# Patient Record
Sex: Female | Born: 1960 | Race: White | Hispanic: No | Marital: Married | Smoking: Never smoker
Health system: Southern US, Community
[De-identification: ages and names within clinical notes are randomized; demographics above are authoritative.]

## PROBLEM LIST (undated history)

## (undated) DIAGNOSIS — F99 Mental disorder, not otherwise specified: Secondary | ICD-10-CM

## (undated) DIAGNOSIS — R1013 Epigastric pain: Secondary | ICD-10-CM

## (undated) DIAGNOSIS — K219 Gastro-esophageal reflux disease without esophagitis: Secondary | ICD-10-CM

## (undated) DIAGNOSIS — E7800 Pure hypercholesterolemia, unspecified: Secondary | ICD-10-CM

## (undated) DIAGNOSIS — I2510 Atherosclerotic heart disease of native coronary artery without angina pectoris: Secondary | ICD-10-CM

## (undated) DIAGNOSIS — C8190 Hodgkin lymphoma, unspecified, unspecified site: Secondary | ICD-10-CM

## (undated) DIAGNOSIS — F329 Major depressive disorder, single episode, unspecified: Secondary | ICD-10-CM

## (undated) DIAGNOSIS — Z8669 Personal history of other diseases of the nervous system and sense organs: Secondary | ICD-10-CM

## (undated) DIAGNOSIS — R002 Palpitations: Secondary | ICD-10-CM

## (undated) DIAGNOSIS — F419 Anxiety disorder, unspecified: Secondary | ICD-10-CM

## (undated) DIAGNOSIS — F32A Depression, unspecified: Secondary | ICD-10-CM

## (undated) HISTORY — DX: Gastro-esophageal reflux disease without esophagitis: K21.9

## (undated) HISTORY — DX: Mental disorder, not otherwise specified: F99

## (undated) HISTORY — DX: Hodgkin lymphoma, unspecified, unspecified site: C81.90

## (undated) HISTORY — DX: Depression, unspecified: F32.A

## (undated) HISTORY — DX: Pure hypercholesterolemia, unspecified: E78.00

## (undated) HISTORY — PX: WISDOM TOOTH EXTRACTION: SHX21

## (undated) HISTORY — PX: OTHER SURGICAL HISTORY: SHX169

## (undated) HISTORY — DX: Palpitations: R00.2

## (undated) HISTORY — PX: BREAST ENHANCEMENT SURGERY: SHX7

## (undated) HISTORY — DX: Atherosclerotic heart disease of native coronary artery without angina pectoris: I25.10

## (undated) HISTORY — DX: Anxiety disorder, unspecified: F41.9

## (undated) HISTORY — DX: Epigastric pain: R10.13

## (undated) HISTORY — DX: Personal history of other diseases of the nervous system and sense organs: Z86.69

## (undated) HISTORY — PX: TONSILLECTOMY: SUR1361

---
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
## 2000-11-07 ENCOUNTER — Ambulatory Visit (HOSPITAL_COMMUNITY): Admission: RE | Admit: 2000-11-07 | Discharge: 2000-11-07 | Payer: Self-pay | Admitting: Plastic Surgery

## 2000-11-07 ENCOUNTER — Encounter: Payer: Self-pay | Admitting: Plastic Surgery

## 2010-06-20 ENCOUNTER — Emergency Department (HOSPITAL_COMMUNITY)
Admission: EM | Admit: 2010-06-20 | Discharge: 2010-06-21 | Disposition: A | Discharge status: Other Acute Inpatient Hospital | Payer: Self-pay | Attending: Emergency Medicine | Admitting: Emergency Medicine

## 2010-06-20 DIAGNOSIS — F3289 Other specified depressive episodes: Secondary | ICD-10-CM | POA: Insufficient documentation

## 2010-06-20 DIAGNOSIS — F411 Generalized anxiety disorder: Secondary | ICD-10-CM | POA: Insufficient documentation

## 2010-06-20 DIAGNOSIS — R45851 Suicidal ideations: Secondary | ICD-10-CM | POA: Insufficient documentation

## 2010-06-20 DIAGNOSIS — M549 Dorsalgia, unspecified: Secondary | ICD-10-CM | POA: Insufficient documentation

## 2010-06-20 DIAGNOSIS — R350 Frequency of micturition: Secondary | ICD-10-CM | POA: Insufficient documentation

## 2010-06-20 DIAGNOSIS — F329 Major depressive disorder, single episode, unspecified: Secondary | ICD-10-CM | POA: Insufficient documentation

## 2010-06-20 LAB — ACETAMINOPHEN LEVEL: Acetaminophen (Tylenol), Serum: <10 ug/mL — ABNORMAL LOW (ref 10–30)

## 2010-06-20 LAB — COMPREHENSIVE METABOLIC PANEL
ALT: 13 U/L (ref 0–35)
AST: 21 U/L (ref 0–37)
Albumin: 4.3 g/dL (ref 3.5–5.2)
Alkaline Phosphatase: 79 U/L (ref 39–117)
BUN: 7 mg/dL (ref 6–23)
CO2: 24 mEq/L (ref 19–32)
Calcium: 10 mg/dL (ref 8.4–10.5)
Chloride: 103 mEq/L (ref 96–112)
Creatinine, Ser: 0.7 mg/dL (ref 0.4–1.2)
GFR calc Af Amer: >60 mL/min (ref >60)
GFR calc non Af Amer: >60 mL/min (ref >60)
Glucose, Bld: 161 mg/dL — ABNORMAL HIGH (ref 70–99)
Potassium: 3.4 mEq/L — ABNORMAL LOW (ref 3.5–5.1)
Sodium: 134 mEq/L — ABNORMAL LOW (ref 135–145)
Total Bilirubin: 0.7 mg/dL (ref 0.3–1.2)
Total Protein: 7.3 g/dL (ref 6.0–8.3)

## 2010-06-20 LAB — CBC
HCT: 42.6 % (ref 36.0–46.0)
Hemoglobin: 14.5 g/dL (ref 12.0–15.0)
MCH: 32.4 pg (ref 26.0–34.0)
MCHC: 34 g/dL (ref 30.0–36.0)
MCV: 95.1 fL (ref 78.0–100.0)
Platelets: 241 10*3/uL (ref 150–400)
RBC: 4.48 MIL/uL (ref 3.87–5.11)
RDW: 12.6 % (ref 11.5–15.5)
WBC: 7.8 10*3/uL (ref 4.0–10.5)

## 2010-06-20 LAB — DIFFERENTIAL
Basophils Absolute: 0 10*3/uL (ref 0.0–0.1)
Basophils Relative: 0 % (ref 0–1)
Eosinophils Absolute: 0.1 10*3/uL (ref 0.0–0.7)
Eosinophils Relative: 1 % (ref 0–5)
Lymphocytes Relative: 32 % (ref 12–46)
Lymphs Abs: 2.4 10*3/uL (ref 0.7–4.0)
Monocytes Absolute: 0.4 10*3/uL (ref 0.1–1.0)
Monocytes Relative: 5 % (ref 3–12)
Neutro Abs: 4.9 10*3/uL (ref 1.7–7.7)
Neutrophils Relative %: 63 % (ref 43–77)

## 2010-06-20 LAB — SALICYLATE LEVEL: Salicylate Lvl: <4 mg/dL (ref 2.8–20.0)

## 2010-06-20 LAB — RAPID URINE DRUG SCREEN, HOSP PERFORMED
Amphetamines: NOT DETECTED
Barbiturates: NOT DETECTED
Opiates: NOT DETECTED
Tetrahydrocannabinol: NOT DETECTED

## 2010-06-20 LAB — ETHANOL: Alcohol, Ethyl (B): <5 mg/dL (ref 0–10)

## 2010-06-21 ENCOUNTER — Inpatient Hospital Stay (HOSPITAL_COMMUNITY)
Admission: EM | Admit: 2010-06-21 | Discharge: 2010-06-23 | DRG: 885 | Disposition: A | Discharge status: Home / Self Care | Payer: 59 | Source: Other Acute Inpatient Hospital | Attending: Psychiatry | Admitting: Psychiatry

## 2010-06-21 DIAGNOSIS — R45851 Suicidal ideations: Secondary | ICD-10-CM

## 2010-06-21 DIAGNOSIS — Z560 Unemployment, unspecified: Secondary | ICD-10-CM

## 2010-06-21 DIAGNOSIS — F339 Major depressive disorder, recurrent, unspecified: Principal | ICD-10-CM

## 2010-06-21 DIAGNOSIS — Z6379 Other stressful life events affecting family and household: Secondary | ICD-10-CM

## 2010-06-21 DIAGNOSIS — F319 Bipolar disorder, unspecified: Secondary | ICD-10-CM

## 2010-06-21 DIAGNOSIS — E876 Hypokalemia: Secondary | ICD-10-CM

## 2010-06-21 DIAGNOSIS — Z882 Allergy status to sulfonamides status: Secondary | ICD-10-CM

## 2010-06-22 NOTE — H&P (Addendum)
NAME:  Gabrielle [name], Gabrielle [name]               ACCOUNT NO.:  [id number]  MEDICAL RECORD NO.:  [id number]           PATIENT TYPE:  I  LOCATION:  0500                          FACILITY:  [facility]  PHYSICIAN:  Marlis Edelson, DO        DATE OF BIRTH:  November 17, 1960  DATE OF ADMISSION:  06/21/2010 DATE OF DISCHARGE:                      PSYCHIATRIC ADMISSION ASSESSMENT   IDENTIFYING INFORMATION:  The patient is a 50 year old divorced white female who presented to the [facility] emergency room reporting increased thoughts of depression with passive thoughts of suicide due to recent increase in family stressors including loss of her job loss, loss of her home, no income and living with her elderly 47 year old mother. She stopped her Lamictal approximately 3 weeks ago.  PAST PSYCHIATRIC HISTORY:  The patient states this is her first admission.  SOCIAL HISTORY:  She is divorced with no children.  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  She lives again [place] elderly mother [place]  FAMILY HISTORY:  Significant for alcoholism in her father.  ALCOHOL/DRUG HISTORY:  None.  MEDICAL HISTORY:  Primary care.  She sees Dr. Carney Corners as her primary care and Dr. Betti Cruz for psychiatry.  Medical problems.  She denies any current ongoing problems.  MEDICATIONS: 1. Xanax. 2. Paxil. 3. Benztropine. 4. She thinks Lamictal which she stopped about 3 weeks ago.  Gets     medications at the drug store in [place], area code [phone]-     2500.  POSITIVE FINDINGS EXAMINATION:  The patient was seen in the emergency room at [facility] where the exam was unremarkable.  LABORATORY RESULTS:  Included a normal CBC with differential.  Negative salicylate level.  Negative acetaminophen level.  Negative alcohol level.  Urine pregnancy was negative and the patient was referred to behavioral health.  Comprehensive metabolic profile slightly  hypokalemic at 134, potassium 3.4, glucose is high at 161 and drug screen was positive for benzodiazepines.  MENTAL STATUS EXAM:  Today the patient is a well-developed, well- nourished young-appearing 50 year old white female, cooperative and pleasant.  Thoughts and speech are normally organized and no evidence of delusions, paranoia.  She was alert and oriented x4, pleasant, informative, makes good eye contact and speech is clear and goal- directed, normal rate and rhythm and volume.  There is no evidence of psychosis.  Mood is depressed.  Affect is sad.  Thought process again disorganized and content is of at least above average intelligence.  ASSESSMENT:  Major depressive disorder recurrent.  PLAN:  Meds will be reconciled.  AXIS I:  Major depressive disorder. AXIS II:  Negative. AXIS III:  No acute illness. AXIS IV:  Problems with primary support, problems with occupation, problems with economic situation, problems with housing.  AXIS V:  GAF current 45.  PLAN:  The patient will be admitted for estimated length of stay 2-3 days.  Meds will be restarted as evaluated and we will follow up with those results.    ______________________________ Verne Spurr, PA   ______________________________ Marlis Edelson, DO    NM/MEDQ  D:  06/21/2010  T:  06/21/2010  Job:  [id number]  Electronically Signed by Marlis Edelson MD on 06/22/2010 08:11:36 PM Electronically Signed by Verne Spurr  on 08/04/2010 09:59:31 AM

## 2010-07-21 NOTE — Discharge Summary (Signed)
  NAME:  Gabrielle [name], Gabrielle [name]               ACCOUNT NO.:  [id number]  MEDICAL RECORD NO.:  [id number]           PATIENT TYPE:  I  LOCATION:  0500                          FACILITY:  [facility]  PHYSICIAN:  Marlis Edelson, DO        DATE OF BIRTH:  04-Aug-1960  DATE OF ADMISSION:  06/21/2010 DATE OF DISCHARGE:                              DISCHARGE SUMMARY   REASON FOR HOSPITALIZATION:  This is a 50 year old divorced female who presented with increasing thoughts of depression, passive thoughts of suicide due to recent increase in family stressors.  The patient lost her job, her home, no income and living with her elderly 50 year old mother.  She also had stopped her Lamictal approximately 3 weeks ago.  FINAL DIAGNOSES:  AXIS I:  Major depressive disorder. AXIS II:  Deferred. AXIS III:  No acute or chronic health issues. AXIS IV:  Problems with primary support group, occupation, economic situation, housing. AXIS V:  Current is 50-55.  SIGNIFICANT LABS:  The patient had a normal CBC.  She had a normal salicylate level.  Normal acetaminophen level.  Alcohol level less than 5.  Urine pregnancy test was negative.  Glucose was elevated at 161. Urine drug screen is positive for benzodiazepines.  Our plan was to admit the patient to the adult milieu in the mood disorder group.  We will resume her medications, continue to assess her comorbidities.  We contacted the patient's boyfriend to address any safety concerns, provide information and education.  She was denying any suicidal thoughts.  The patient was endorsing her multiple stressors. The patient was reporting panic attacks that were treated with Paxil. The patient was acting appropriate on the unit.  She was having coherent thought content, increasing insight.  On day of discharge the patient denied any suicidal or homicidal thoughts.  She was to be followed up at [facility].  DISCHARGE MEDICATIONS: 1. Included Vistaril 2 capsules q.h.s. p.r.n. 2.  Multivitamin one daily. 3. Paxil 40 mg taking 1-1/2 tablets daily. 4. Xanax 0.5 mg 1 tablet q.i.d. 5. The patient was to stop her Ambien.  FOLLOWUP:  Her followup was at [facility] on Thursday, March 1.  The patient is to call to get an appointment with Dr. Betti Cruz.     Landry Corporal, N.P.   ______________________________ Marlis Edelson, DO    JO/MEDQ  D:  07/21/2010  T:  07/21/2010  Job:  [id number]  Electronically Signed by Limmie PatriciaP. on 07/21/2010 03:54:25 PM Electronically Signed by Marlis Edelson MD on 07/21/2010 08:59:44 PM

## 2011-12-06 ENCOUNTER — Encounter: Payer: Self-pay | Admitting: Obstetrics and Gynecology

## 2012-01-18 ENCOUNTER — Encounter: Payer: Self-pay | Admitting: Obstetrics and Gynecology

## 2012-01-18 ENCOUNTER — Ambulatory Visit (INDEPENDENT_AMBULATORY_CARE_PROVIDER_SITE_OTHER): Payer: Medicare Other | Admitting: Obstetrics and Gynecology

## 2012-01-18 VITALS — BP 114/72 | HR 76 | Ht 62.5 in | Wt 134.0 lb

## 2012-01-18 DIAGNOSIS — F329 Major depressive disorder, single episode, unspecified: Secondary | ICD-10-CM

## 2012-01-18 DIAGNOSIS — N951 Menopausal and female climacteric states: Secondary | ICD-10-CM

## 2012-01-18 DIAGNOSIS — N912 Amenorrhea, unspecified: Secondary | ICD-10-CM

## 2012-01-18 DIAGNOSIS — L659 Nonscarring hair loss, unspecified: Secondary | ICD-10-CM

## 2012-01-18 DIAGNOSIS — F32A Depression, unspecified: Secondary | ICD-10-CM

## 2012-01-18 LAB — TSH: TSH: 1.583 u[IU]/mL (ref 0.350–4.500)

## 2012-01-18 NOTE — Progress Notes (Addendum)
Menopausal symptoms:anxiety, decreased libido, depression, dry skin, hot flashes, insomnia, moodiness, no energy, vaginal dryness  The patient is not taking hormone replacement therapy The patient  is taking a Calcium supplement. The patient participates in regular exercise: no. Post-menopausal bleeding:no  The patient is sexually active.  Last Pap: was normal December  2012 Last mammogram: was normal 2 years ago Last DEXA scan : never had one and pt states she do not want one  History of DVT/PE: No pt has fhx of dvt: maternal aunt Family history of breast cancer: No Family history of endometrial cancer:No  PT IS DIVORSED

## 2012-01-18 NOTE — Patient Instructions (Signed)
To develop good sleep habits:    Go to bed and get up  at the same time each day (even on your days off)  Avoid caffeine, alcohol or nicotine at least 4-6 hours before bedtime  If you haven't fallen asleep within 15 minutes of getting in the bed, get up and do something non-stimulating  until you feel sleepy again,  then return to bed.  Only try to sleep when you are actually sleepy.  Do not watch TV, read, write, play games or talk on the phone in bed.  Only use the bed for sleep and sex  Do not nap or remain  in the bed if you are awake  Do not go to bed too  hungry or  too full   Develop a routine prior to bedtime so that your body will get a signal that bedtime is near  Do not do anything stimulating before bedtime  Make sure that your bedroom is comfortable for sleeping   
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
## 2012-01-21 ENCOUNTER — Encounter: Payer: Self-pay | Admitting: Obstetrics and Gynecology

## 2012-01-27 ENCOUNTER — Telehealth: Payer: Self-pay | Admitting: Obstetrics and Gynecology

## 2012-01-27 NOTE — Telephone Encounter (Signed)
Tc from pt. Told pt TSH-wnl. Pt agrees.

## 2012-01-27 NOTE — Telephone Encounter (Signed)
Tc to pt per telephone call. Lm on vm to cb. 

## 2012-02-10 ENCOUNTER — Telehealth: Payer: Self-pay | Admitting: Obstetrics and Gynecology

## 2012-02-10 NOTE — Telephone Encounter (Signed)
Spoke with pt rgd msg pt calling for saliva test results informed pt will consult with provider rgd recommendations and call her back pt voice understanding

## 2012-02-16 ENCOUNTER — Telehealth: Payer: Self-pay | Admitting: Obstetrics and Gynecology

## 2012-02-16 MED ORDER — PROGESTERONE MICRONIZED 200 MG PO CAPS: ORAL_CAPSULE | ORAL | Status: DC

## 2012-02-16 MED ORDER — ESTRADIOL 0.5 MG PO TABS: 0.5000 mg | ORAL_TABLET | Freq: Every day | ORAL | Status: DC

## 2012-02-16 NOTE — Telephone Encounter (Signed)
Call to patient to review recent hormone results: low estradiol, progesterone, and low normal DHEA.  Patient's morning cortisol and testosterone were both high. Patient to begin oral Estradiol 0.5 mg qd, Micronized Progesterone 200 mg qhs day 1-26 each month and DHEA 5-10 mg qd.   Patient urged to pursue a "talk" therapist to assist with coping and lifestyle skills.  Patient to follow up in 4 weeks (verbally) or sooner if she has any concerns or questions.  Will have a visit in 8 weeks.  Jaydan Chretien, PA-C

## 2012-02-17 ENCOUNTER — Telehealth: Payer: Self-pay | Admitting: Obstetrics and Gynecology

## 2012-02-23 ENCOUNTER — Telehealth: Payer: Self-pay | Admitting: Obstetrics and Gynecology

## 2012-02-23 ENCOUNTER — Telehealth: Payer: Self-pay

## 2012-02-23 ENCOUNTER — Other Ambulatory Visit: Payer: Self-pay | Admitting: Obstetrics and Gynecology

## 2012-02-23 NOTE — Telephone Encounter (Signed)
TC to pt.  Per EP notified RX has been called.

## 2012-02-23 NOTE — Telephone Encounter (Signed)
VM from pt states rx was called to incorrect pharmacy.

## 2012-02-23 NOTE — Telephone Encounter (Signed)
Call to [facility], in response to patient stating that her medications had not been called in, Progesterone Capsules 200 mg  #30 1 po qhs days 1-26 of each month with 5 refills.  Yeiren Whitecotton, PA-C

## 2012-02-23 NOTE — Telephone Encounter (Signed)
VM from pt. States awaiting RX from EP.  Not at compounding pharmacy.  Pt [phone]

## 2012-02-24 NOTE — Telephone Encounter (Signed)
VM from pt.  Pharmacy issue is resolved.

## 2012-03-29 ENCOUNTER — Telehealth: Payer: Self-pay | Admitting: Obstetrics and Gynecology

## 2012-03-30 NOTE — Telephone Encounter (Signed)
TC TO PT REGARDING MESSAGE. PT IS TAKING HORMONE REPLACEMENT THERAPY AND IS HAVING SOME SPOTTING. PT STATES WHEN SHE HAD THE SPOTTING SHE DID CUT BACK ON MED (ESTRADIOL AND PROGESTERONE). PT WANT TO KNOW WHAT TO DO AND I TOLD PT THAT I WILL SEND EP A MESSAGE AND SEE WHAT HER REPLY IS AND GIVE PT A CALL BACK. INFORMED PT THAT IF SHE STARTS SOAKING A PAD AN HOUR  TO GIVE [place] A CALL BACK. PT VOICED UNDERSTANDING.

## 2012-03-30 NOTE — Telephone Encounter (Signed)
EP, THIS PT IS CALLING REGARDING HER HORMONE THERAPY. SHE TAKES ESTRADIOL 0.5 MG AND PROGESTERONE 200 MG AND SHE STATES THAT SHE IS HAVING SOME SPOTTING. PT DID SAY THAT WHEN SHE HAD THE SPOTTING SHE DID CUT BACK ON MEDS.  PT WANT TO KNOW WHAT DO SHE NEED TO DO. PT STATES AT FIRST, SHE SOAK A PAD A DAY, THEN SHE JUST HAD PERIODIC SPOTTING. WHAT IS YOUR RECOMMENDATION?

## 2012-04-06 ENCOUNTER — Telehealth: Payer: Self-pay | Admitting: Obstetrics and Gynecology

## 2012-04-06 NOTE — Telephone Encounter (Signed)
Tc to pt per telephone call. Pt wtold to take progesterone day 1-26 of each month. Pt voices understanding.

## 2013-04-26 DIAGNOSIS — R002 Palpitations: Secondary | ICD-10-CM

## 2013-04-26 HISTORY — DX: Palpitations: R00.2

## 2013-09-24 ENCOUNTER — Encounter: Payer: Self-pay | Admitting: Cardiovascular Disease

## 2013-09-24 ENCOUNTER — Encounter (INDEPENDENT_AMBULATORY_CARE_PROVIDER_SITE_OTHER): Payer: Self-pay

## 2013-09-24 ENCOUNTER — Ambulatory Visit (INDEPENDENT_AMBULATORY_CARE_PROVIDER_SITE_OTHER): Payer: Medicare Other | Admitting: Cardiovascular Disease

## 2013-09-24 VITALS — BP 110/80 | HR 81 | Ht 64.0 in | Wt 131.0 lb

## 2013-09-24 DIAGNOSIS — R0609 Other forms of dyspnea: Secondary | ICD-10-CM

## 2013-09-24 DIAGNOSIS — R002 Palpitations: Secondary | ICD-10-CM | POA: Insufficient documentation

## 2013-09-24 DIAGNOSIS — R0989 Other specified symptoms and signs involving the circulatory and respiratory systems: Secondary | ICD-10-CM

## 2013-09-24 DIAGNOSIS — R0600 Dyspnea, unspecified: Secondary | ICD-10-CM

## 2013-09-24 HISTORY — PX: TRANSTHORACIC ECHOCARDIOGRAM: SHX275

## 2013-09-24 NOTE — Progress Notes (Signed)
     History of Present Illness: 53 yo female with history of migraine headaches, IBS, depression who is here today as a new patient for evaluation of palpitations. She has noticed skipped heart beats for the last 20 years. Within the last year she has had almost daily episodes of skipped beats. This is associated with dyspnea. No chest pain. She is very active.   Primary Care Physician: Monico Blitz  Past Medical History  Diagnosis Date  . Hx of migraines   . Depression   . Anxiety     Past Surgical History  Procedure Laterality Date  . Wisdom tooth extraction    . Tonsillectomy    . Breast enhancement surgery      Current Outpatient Prescriptions  Medication Sig Dispense Refill  . ALPRAZolam (XANAX) 0.5 MG tablet Take 0.5 mg by mouth 3 (three) times daily as needed.      Marland Kitchen estradiol (ESTRACE) 0.5 MG tablet Take 1 tablet (0.5 mg total) by mouth daily.  30 tablet  5  . lamoTRIgine (LAMICTAL) 100 MG tablet Take 100 mg by mouth daily.      Marland Kitchen PARoxetine (PAXIL) 40 MG tablet Take 40 mg by mouth every morning.      . progesterone (PROMETRIUM) 200 MG capsule 1 po qhs day 1- 26 of each month  30 capsule  5   No current facility-administered medications for this visit.    Allergies  Allergen Reactions  . Sulfa Antibiotics     History   Social History  . Marital Status: Divorced    Spouse Name: N/A    Number of Children: 0  . Years of Education: N/A   Occupational History  . Disability    Social History Main Topics  . Smoking status: Never Smoker   . Smokeless tobacco: Never Used  . Alcohol Use: No  . Drug Use: No  . Sexual Activity: Yes    Birth Control/ Protection: Condom   Other Topics Concern  . Not on file   Social History Narrative  . No narrative on file    Family History  Problem Relation Age of Onset  . Heart disease Mother   . Emphysema Mother   . Hypertension Mother   . Stroke Mother   . Depression Mother   . Hypertension Brother   . Depression  Brother   . Stroke Maternal Aunt   . Depression Maternal Aunt   . Heart failure Mother   . CAD Maternal Aunt     Review of Systems:  As stated in the HPI and otherwise negative.   BP 110/80  Pulse 81  Ht 5\' 4"  (1.626 m)  Wt 131 lb (59.421 kg)  BMI 22.47 kg/m2  Physical Examination: General: Well developed, well nourished, NAD HEENT: OP clear, mucus membranes moist SKIN: warm, dry. No rashes. Neuro: No focal deficits Musculoskeletal: Muscle strength 5/5 all ext Psychiatric: Mood and affect normal Neck: No JVD, no carotid bruits, no thyromegaly, no lymphadenopathy. Lungs:Clear bilaterally, no wheezes, rhonci, crackles Cardiovascular: Regular rate and rhythm. No murmurs, gallops or rubs. Abdomen:Soft. Bowel sounds present. Non-tender.  Extremities: No lower extremity edema. Pulses are 2 + in the bilateral DP/PT.  EKG: NSR, rate 81 bpm.   Assessment and Plan:   1. Palpitations: She likely has premature beats. Will arrange 48 hour monitor to assess, exclude atrial fib/flutter. Avoid stimulants. Reduce caffeine intake.   2. Dyspnea: Related to palpitations. Will arrange echo to assess LV function.

## 2013-09-24 NOTE — Patient Instructions (Signed)
Your physician recommends that you schedule a follow-up appointment in:  4-6 weeks.   Your physician has requested that you have an echocardiogram. Echocardiography is a painless test that uses sound waves to create images of your heart. It provides your doctor with information about the size and shape of your heart and how well your heart's chambers and valves are working. This procedure takes approximately one hour. There are no restrictions for this procedure.   Your physician has recommended that you wear a holter monitor. Holter monitors are medical devices that record the heart's electrical activity. Doctors most often use these monitors to diagnose arrhythmias. Arrhythmias are problems with the speed or rhythm of the heartbeat. The monitor is a small, portable device. You can wear one while you do your normal daily activities. This is usually used to diagnose what is causing palpitations/syncope (passing out).

## 2013-09-26 ENCOUNTER — Encounter: Payer: Self-pay | Admitting: Radiology

## 2013-09-26 ENCOUNTER — Ambulatory Visit (HOSPITAL_COMMUNITY)
Admission: RE | Admit: 2013-09-26 | Discharge: 2013-09-26 | Disposition: A | Discharge status: Home / Self Care | Payer: Medicare Other | Source: Ambulatory Visit | Attending: Cardiovascular Disease | Admitting: Cardiovascular Disease

## 2013-09-26 ENCOUNTER — Other Ambulatory Visit: Payer: Self-pay | Admitting: *Deleted

## 2013-09-26 DIAGNOSIS — R0600 Dyspnea, unspecified: Secondary | ICD-10-CM

## 2013-09-26 DIAGNOSIS — R0609 Other forms of dyspnea: Secondary | ICD-10-CM | POA: Insufficient documentation

## 2013-09-26 DIAGNOSIS — R002 Palpitations: Secondary | ICD-10-CM

## 2013-09-26 DIAGNOSIS — R0602 Shortness of breath: Secondary | ICD-10-CM

## 2013-09-26 DIAGNOSIS — R0989 Other specified symptoms and signs involving the circulatory and respiratory systems: Principal | ICD-10-CM | POA: Insufficient documentation

## 2013-09-26 NOTE — Progress Notes (Signed)
2D Echo Performed 09/26/2013    Ellyssa Zagal, RCS  

## 2013-09-26 NOTE — Progress Notes (Signed)
Patient ID: Gabrielle [name], female   DOB: Nov 30, 1960, 53 y.o.   MRN: [id number] Evo 48hr holter applied

## 2013-10-18 ENCOUNTER — Telehealth: Payer: Self-pay | Admitting: *Deleted

## 2013-10-18 NOTE — Telephone Encounter (Signed)
Spoke with pt and reviewed monitor results with her.  

## 2013-10-18 NOTE — Telephone Encounter (Signed)
I placed call to pt to review monitor results. Left message to call back 

## 2013-11-15 ENCOUNTER — Ambulatory Visit: Payer: Medicare Other | Admitting: Cardiovascular Disease

## 2014-03-04 DIAGNOSIS — C8580 Other specified types of non-Hodgkin lymphoma, unspecified site: Secondary | ICD-10-CM | POA: Insufficient documentation

## 2014-04-26 DIAGNOSIS — C8590 Non-Hodgkin lymphoma, unspecified, unspecified site: Secondary | ICD-10-CM

## 2014-04-26 HISTORY — PX: OTHER SURGICAL HISTORY: SHX169

## 2014-04-26 HISTORY — DX: Non-Hodgkin lymphoma, unspecified, unspecified site: C85.90

## 2014-05-28 ENCOUNTER — Ambulatory Visit (INDEPENDENT_AMBULATORY_CARE_PROVIDER_SITE_OTHER): Payer: Medicare Other | Admitting: Physician Assistant

## 2014-05-28 ENCOUNTER — Encounter: Payer: Self-pay | Admitting: Physician Assistant

## 2014-05-28 ENCOUNTER — Other Ambulatory Visit (INDEPENDENT_AMBULATORY_CARE_PROVIDER_SITE_OTHER): Payer: Medicare Other

## 2014-05-28 VITALS — BP 116/70 | HR 66 | Ht 64.0 in | Wt 133.0 lb

## 2014-05-28 DIAGNOSIS — R140 Abdominal distension (gaseous): Secondary | ICD-10-CM

## 2014-05-28 DIAGNOSIS — Z1211 Encounter for screening for malignant neoplasm of colon: Secondary | ICD-10-CM

## 2014-05-28 DIAGNOSIS — R1013 Epigastric pain: Secondary | ICD-10-CM

## 2014-05-28 LAB — COMPREHENSIVE METABOLIC PANEL
ALT: 17 U/L (ref 0–35)
AST: 21 U/L (ref 0–37)
Albumin: 4.9 g/dL (ref 3.5–5.2)
Alkaline Phosphatase: 101 U/L (ref 39–117)
BILIRUBIN TOTAL: 0.4 mg/dL (ref 0.2–1.2)
BUN: 17 mg/dL (ref 6–23)
CALCIUM: 10.2 mg/dL (ref 8.4–10.5)
CHLORIDE: 103 meq/L (ref 96–112)
CO2: 32 mEq/L (ref 19–32)
Creatinine, Ser: 0.76 mg/dL (ref 0.40–1.20)
GFR: 84.34 mL/min (ref >60.00)
Glucose, Bld: 93 mg/dL (ref 70–99)
Potassium: 4.7 mEq/L (ref 3.5–5.1)
Sodium: 140 mEq/L (ref 135–145)
Total Protein: 7.2 g/dL (ref 6.0–8.3)

## 2014-05-28 LAB — LIPID PANEL
CHOL/HDL RATIO: 3
CHOLESTEROL: 175 mg/dL (ref 0–200)
HDL: 56.4 mg/dL (ref >39.00)
LDL CALC: 89 mg/dL (ref 0–99)
NONHDL: 118.6
Triglycerides: 146 mg/dL (ref 0.0–149.0)
VLDL: 29.2 mg/dL (ref 0.0–40.0)

## 2014-05-28 LAB — CBC WITH DIFFERENTIAL/PLATELET
BASOS ABS: 0 10*3/uL (ref 0.0–0.1)
Basophils Relative: 0.5 % (ref 0.0–3.0)
EOS ABS: 0.1 10*3/uL (ref 0.0–0.7)
Eosinophils Relative: 1.3 % (ref 0.0–5.0)
HCT: 41.2 % (ref 36.0–46.0)
HEMOGLOBIN: 14.2 g/dL (ref 12.0–15.0)
LYMPHS ABS: 2.4 10*3/uL (ref 0.7–4.0)
Lymphocytes Relative: 27.9 % (ref 12.0–46.0)
MCHC: 34.5 g/dL (ref 30.0–36.0)
MCV: 94.1 fl (ref 78.0–100.0)
MONO ABS: 0.7 10*3/uL (ref 0.1–1.0)
Monocytes Relative: 7.7 % (ref 3.0–12.0)
NEUTROS ABS: 5.3 10*3/uL (ref 1.4–7.7)
NEUTROS PCT: 62.6 % (ref 43.0–77.0)
Platelets: 254 10*3/uL (ref 150.0–400.0)
RBC: 4.37 Mil/uL (ref 3.87–5.11)
RDW: 12.6 % (ref 11.5–15.5)
WBC: 8.5 10*3/uL (ref 4.0–10.5)

## 2014-05-28 LAB — IGA: IgA: 222 mg/dL (ref 68–378)

## 2014-05-28 LAB — TSH: TSH: 1.6 u[IU]/mL (ref 0.35–4.50)

## 2014-05-28 MED ORDER — MOVIPREP 100 G PO SOLR: 1.0000 | Freq: Once | ORAL | Status: DC

## 2014-05-28 NOTE — Patient Instructions (Addendum)
Your physician has requested that you go to the basement for the following lab work before leaving today: CBC, CMET, TSH, IgA, tissue transglutamase, lipid panel  You have been scheduled for a CT scan of the abdomen and pelvis at [facility] ([address]---this is in the same building as Press photographer).   You are scheduled on 05/29/14 at 1:45 pm. You should arrive 15 minutes prior to your appointment time for registration. Please follow the written instructions below on the day of your exam:  WARNING: IF YOU ARE ALLERGIC TO IODINE/X-RAY DYE, PLEASE NOTIFY RADIOLOGY IMMEDIATELY AT [phone]! YOU WILL BE GIVEN A 13 HOUR PREMEDICATION PREP.  1) Do not eat or drink anything after 9:45 am (4 hours prior to your test) 2) You have been given 2 bottles of oral contrast to drink. The solution may taste better if refrigerated, but do NOT add ice or any other liquid to this solution. Shake well before drinking.    Drink 1 bottle of contrast @ 11:45 am (2 hours prior to your exam)  Drink 1 bottle of contrast @ 12:45 am (1 hour prior to your exam)  You may take any medications as prescribed with a small amount of water except for the following: Metformin, Glucophage, Glucovance, Avandamet, Riomet, Fortamet, Actoplus Met, Janumet, Glumetza or Metaglip. The above medications must be held the day of the exam AND 48 hours after the exam.  The purpose of you drinking the oral contrast is to aid in the visualization of your intestinal tract. The contrast solution may cause some diarrhea. Before your exam is started, you will be given a small amount of fluid to drink. Depending on your individual set of symptoms, you may also receive an intravenous injection of x-ray contrast/dye. Plan on being at [facility] for 30 minutes or long, depending on the type of exam you are having performed.  This test typically takes 30-45 minutes to complete.  If you have any questions regarding your  exam or if you need to reschedule, you may call the CT department at [phone] between the hours of 8:00 am and 5:00 pm, Monday-Friday.  ________________________________________________________________________ Dennis Bast should hear from [employer] regarding Cologuard testing.  CC:Dr Manuella Ghazi

## 2014-05-28 NOTE — Progress Notes (Signed)
Patient ID: Gabrielle [name], female   DOB: [birth year], 54 y.o.   MRN: [id number]    HPI:    Gabrielle [name] is a 54 year old female referred for evaluation by Dr. Brigitte Pulse due to abdominal bloating, dyspepsia, and need for colorectal cancer screening.  [name] reports that for the past 6 months her total abdomen feels excessively distended, bloated, and gassy. She has not changed her diet at all but states some days she is so bloated and she has to loosen her clothing. She started using a probiotic but it has not provided much relief. She has been nauseous, and saw her PCP last week and was told her nausea was due to a vertical from a sinus infection. She was started on meclizine and amoxicillin and her nausea has improved but her bloating persist. She has no postprandial cramping or defecation. Her appetite has been good and she has no weight loss. She has no dysphasia or heartburn. She has not been burping or belching. She has not found anything that makes her bloating better but finds it is worse when she eats. She has her uterus and ovaries and is menopausal. She was previously on hormone replacement but discontinued it a year and a half ago because she didn't think she needed it anymore she has a gynecologist but has not seen him in over 2 years. She has never had a colonoscopy. She does report that she has been under an inordinate amount of stress and she wonders if this contributes to her bloating.   Past Medical History  Diagnosis Date  . Hx of migraines   . Depression   . Anxiety     Past Surgical History  Procedure Laterality Date  . Wisdom tooth extraction    . Tonsillectomy    . Breast enhancement surgery     Family History  Problem Relation Age of Onset  . Heart disease Mother   . Emphysema Mother   . Hypertension Mother   . Stroke Mother   . Depression Mother   . Hypertension Brother   . Depression Brother   . Stroke Maternal Aunt   . Depression Maternal Aunt   . Heart failure Mother   .  CAD Maternal Aunt   . Colon cancer     History  Substance Use Topics  . Smoking status: Never Smoker   . Smokeless tobacco: Never Used  . Alcohol Use: No   Current Outpatient Prescriptions  Medication Sig Dispense Refill  . ALPRAZolam (XANAX) 0.5 MG tablet Take 0.5 mg by mouth 3 (three) times daily as needed.    . lamoTRIgine (LAMICTAL) 100 MG tablet Take 200 mg by mouth daily.     Marland Kitchen PARoxetine (PAXIL) 40 MG tablet Take 40 mg by mouth every morning.     No current facility-administered medications for this visit.   Allergies  Allergen Reactions  . Sulfa Antibiotics      Review of Systems: Gen: Denies any fever, chills, sweats, anorexia, fatigue, weakness, malaise, weight loss, and sleep disorder CV: Denies chest pain, angina, palpitations, syncope, orthopnea, PND, peripheral edema, and claudication. Resp: Denies dyspnea at rest, dyspnea with exercise, cough, sputum, wheezing, coughing up blood, and pleurisy. GI: Denies vomiting blood, jaundice, and fecal incontinence.   Denies dysphagia or odynophagia. GU : Denies urinary burning, blood in urine, urinary frequency, urinary hesitancy, nocturnal urination, and urinary incontinence. MS: Denies joint pain, limitation of movement, and swelling, stiffness, low back pain, extremity pain. Denies muscle weakness, cramps, atrophy.  Derm: Denies  rash, itching, dry skin, hives, moles, warts, or unhealing ulcers.  Psych: Denies depression, anxiety, memory loss, suicidal ideation, hallucinations, paranoia, and confusion. Heme: Denies bruising, bleeding, and enlarged lymph nodes. Neuro:  Denies any headaches, dizziness, paresthesias. Endo:  Denies any problems with DM, thyroid, adrenal function    Physical Exam: BP 116/70 mmHg  Pulse 66  Ht 5\' 4"  (1.626 m)  Wt 133 lb (60.328 kg)  BMI 22.82 kg/m2 Constitutional: Pleasant,well-developed,anxious female in no acute distress. HEENT: Normocephalic and atraumatic. Conjunctivae are normal. No  scleral icterus. Neck supple. No thyromegaly Cardiovascular: Normal rate, regular rhythm.  Pulmonary/chest: Effort normal and breath sounds normal. No wheezing, rales or rhonchi. Abdominal: Soft,  mildlydistended, nontender. Bowel sounds active throughout. There are no masses palpable. No hepatomegaly. Extremities: no edema Lymphadenopathy: No cervical adenopathy noted. Neurological: Alert and oriented to person place and time. Skin: Skin is warm and dry. No rashes noted. Psychiatric: Normal mood and affect. Behavior is normal.  ASSESSMENT AND PLAN: #1 abdominal bloating and dyspepsia. This may be due to a component of irritable bowel based on the amount of stress the patient has been under. However, a CT of the abdomen and pelvis will be obtained to evaluate for any ovarian or other pathology. A CBC, comprehensive metabolic panel, TSH, IgA, and TTG will be obtained as well. The patient would like a lipid panel added if she needs to see her primary care next week as well.  #2. Need for colorectal cancer screening. A considerable amount of time has been spent with the patient reviewing her options of colonoscopy versus cold guard testing. The patient does not feel she would be able to tolerate a bowel prep and so the decision was made to proceed with cologuard testing with the understanding that if the test is positive she would need a conventional colonoscopy. However, the patient changed her mind before she left the waiting room in her colo guard testing was canceled and she opted to schedule for a colonoscopy.The risks, benefits, and alternatives to colonoscopy with possible biopsy and possible polypectomy were discussed with the patient and they consent to proceed. The procedure will be scheduled with Dr. Ardis Hughs.  Further recommendations will be made pending the findings of her CT scan and colonoscopy.    Kyrel Leighton, Vita Barley PA-C 05/28/2014, 4:00 PM

## 2014-05-29 ENCOUNTER — Ambulatory Visit (INDEPENDENT_AMBULATORY_CARE_PROVIDER_SITE_OTHER)
Admission: RE | Admit: 2014-05-29 | Discharge: 2014-05-29 | Disposition: A | Discharge status: Home / Self Care | Payer: Medicare Other | Source: Ambulatory Visit | Attending: Physician Assistant | Admitting: Physician Assistant

## 2014-05-29 DIAGNOSIS — R140 Abdominal distension (gaseous): Secondary | ICD-10-CM

## 2014-05-29 DIAGNOSIS — R1013 Epigastric pain: Secondary | ICD-10-CM

## 2014-05-29 LAB — TISSUE TRANSGLUTAMINASE, IGA: Tissue Transglutaminase Ab, IgA: 1 U/mL

## 2014-05-29 MED ORDER — IOHEXOL 300 MG/ML  SOLN
100.0000 mL | Freq: Once | INTRAMUSCULAR | Status: AC | PRN
  Administered 2014-05-29: 100 mL via INTRAVENOUS

## 2014-05-29 NOTE — Progress Notes (Signed)
I agree with the above note, plan 

## 2014-05-30 ENCOUNTER — Telehealth: Payer: Self-pay

## 2014-05-30 DIAGNOSIS — R1900 Intra-abdominal and pelvic swelling, mass and lump, unspecified site: Secondary | ICD-10-CM

## 2014-05-30 NOTE — Telephone Encounter (Addendum)
   Gabrielle [name], see below. I spoke with her this am   [name],  She needs referral to interventional radiology for image guided biopsy of the abdominal mass, they should note that lymphoma is in the differential. She also needs referral to medical oncology.   [name],  She needs to be put on the next gi cancer conference as well.  '  Holding on surgical referral until after pathology back, review at tumor board.          ----- Message from Milus Banister, MD sent at 05/30/2014  9:45 AM EST ----- Change of plans.    She really is based out of [place]. Will almost certainly want her care there.  She will go ahead with the biopsy with IR but then lets cancel the med onc referral.  Thanks  [name], Still put her on for next tumor board.  Thanks

## 2014-05-30 NOTE — Telephone Encounter (Signed)
Referral to IR for liver biopsy also email to [facility] for GI conference

## 2014-05-30 NOTE — Telephone Encounter (Signed)
Referral to IR and GI conference

## 2014-05-31 ENCOUNTER — Other Ambulatory Visit: Payer: Self-pay | Admitting: Radiology

## 2014-06-04 ENCOUNTER — Ambulatory Visit (HOSPITAL_COMMUNITY)
Admission: RE | Admit: 2014-06-04 | Discharge: 2014-06-04 | Disposition: A | Discharge status: Home / Self Care | Payer: Medicare Other | Source: Ambulatory Visit | Attending: Gastroenterology | Admitting: Gastroenterology

## 2014-06-04 DIAGNOSIS — R1900 Intra-abdominal and pelvic swelling, mass and lump, unspecified site: Secondary | ICD-10-CM | POA: Insufficient documentation

## 2014-06-04 DIAGNOSIS — R002 Palpitations: Secondary | ICD-10-CM | POA: Insufficient documentation

## 2014-06-04 LAB — CBC
HEMATOCRIT: 44.7 % (ref 36.0–46.0)
HEMOGLOBIN: 15 g/dL (ref 12.0–15.0)
MCH: 32.5 pg (ref 26.0–34.0)
MCHC: 33.6 g/dL (ref 30.0–36.0)
MCV: 97 fL (ref 78.0–100.0)
PLATELETS: 227 10*3/uL (ref 150–400)
RBC: 4.61 MIL/uL (ref 3.87–5.11)
RDW: 12.2 % (ref 11.5–15.5)
WBC: 6 10*3/uL (ref 4.0–10.5)

## 2014-06-04 LAB — PROTIME-INR
INR: 0.98 (ref 0.00–1.49)
PROTHROMBIN TIME: 13.1 s (ref 11.6–15.2)

## 2014-06-04 LAB — APTT: APTT: 29 s (ref 24–37)

## 2014-06-04 MED ORDER — MIDAZOLAM HCL 2 MG/2ML IJ SOLN
INTRAMUSCULAR | Status: AC | PRN
  Administered 2014-06-04 (×2): 1 mg via INTRAVENOUS

## 2014-06-04 MED ORDER — GELATIN ABSORBABLE 12-7 MM EX MISC
CUTANEOUS | Status: AC
  Filled 2014-06-04: qty 1

## 2014-06-04 MED ORDER — LIDOCAINE HCL (PF) 1 % IJ SOLN
INTRAMUSCULAR | Status: AC
  Filled 2014-06-04: qty 30

## 2014-06-04 MED ORDER — MIDAZOLAM HCL 2 MG/2ML IJ SOLN
INTRAMUSCULAR | Status: AC
  Filled 2014-06-04: qty 4

## 2014-06-04 MED ORDER — SODIUM CHLORIDE 0.9 % IV SOLN
Freq: Once | INTRAVENOUS | Status: AC
  Administered 2014-06-04: 08:00:00 via INTRAVENOUS

## 2014-06-04 MED ORDER — FENTANYL CITRATE 0.05 MG/ML IJ SOLN
INTRAMUSCULAR | Status: AC | PRN
  Administered 2014-06-04: 50 ug via INTRAVENOUS
  Administered 2014-06-04: 25 ug via INTRAVENOUS

## 2014-06-04 MED ORDER — FENTANYL CITRATE 0.05 MG/ML IJ SOLN
INTRAMUSCULAR | Status: AC
  Filled 2014-06-04: qty 4

## 2014-06-04 MED ORDER — HYDROCODONE-ACETAMINOPHEN 5-325 MG PO TABS
1.0000 | ORAL_TABLET | ORAL | Status: DC | PRN
  Filled 2014-06-04: qty 2

## 2014-06-04 NOTE — Procedures (Signed)
Procedure:  CT guided core biopsy of left abdominal peritoneal mass Findings:  8 cm left mid abdominal mass localized.  CT guided core biopsy performed with 18 G core samples x 3 via 17 G needle.   Tract embolized with Gelfoam pledgets.  No immediate complications.

## 2014-06-04 NOTE — H&P (Signed)
Chief Complaint: Abdominal bloating  Referring Physician(s): Jacobs,Daniel P  History of Present Illness: Gabrielle [name] is a 54 y.o. female   Pt has noticed for several weeks abd bloating and gassiness No pain really Unable to eat without nausea 05/29/14 Abd CT reveals abdominal mass Now scheduled for bx of same  Past Medical History  Diagnosis Date  . Hx of migraines   . Depression   . Anxiety   . Palpitations     Past Surgical History  Procedure Laterality Date  . Wisdom tooth extraction    . Tonsillectomy    . Breast enhancement surgery      Allergies: Sulfa antibiotics  Medications: Prior to Admission medications   Medication Sig Start Date End Date Taking? Authorizing Provider  acetaminophen (TYLENOL) 500 MG tablet Take 500 mg by mouth every 6 (six) hours as needed for headache (pain).   Yes Historical Provider, MD  ALPRAZolam Duanne Moron) 0.5 MG tablet Take 0.5 mg by mouth 3 (three) times daily as needed for anxiety.    Yes Historical Provider, MD  amoxicillin (AMOXIL) 500 MG capsule Take 500 mg by mouth 3 (three) times daily.   Yes Historical Provider, MD  Aspirin-Acetaminophen-Caffeine (GOODY HEADACHE PO) Take 1 packet by mouth daily as needed (headache).   Yes Historical Provider, MD  lamoTRIgine (LAMICTAL) 100 MG tablet Take 200 mg by mouth daily.    Yes Historical Provider, MD  PARoxetine (PAXIL) 40 MG tablet Take 40 mg by mouth every morning.   Yes Historical Provider, MD    Family History  Problem Relation Age of Onset  . Heart disease Mother   . Emphysema Mother   . Hypertension Mother   . Stroke Mother   . Depression Mother   . Hypertension Brother   . Depression Brother   . Stroke Maternal Aunt   . Depression Maternal Aunt   . Heart failure Mother   . CAD Maternal Aunt   . Colon cancer      History   Social History  . Marital Status: Married    Spouse Name: N/A    Number of Children: 0  . Years of Education: N/A   Occupational History    . Disability    Social History Main Topics  . Smoking status: Never Smoker   . Smokeless tobacco: Never Used  . Alcohol Use: No  . Drug Use: No  . Sexual Activity: Yes    Birth Control/ Protection: Condom   Other Topics Concern  . Not on file   Social History Narrative    Review of Systems: A 12 point ROS discussed and pertinent positives are indicated in the HPI above.  All other systems are negative.  Review of Systems  Constitutional: Positive for activity change and appetite change. Negative for fatigue and unexpected weight change.  HENT: Negative for trouble swallowing.   Respiratory: Negative for choking and shortness of breath.   Gastrointestinal: Positive for nausea and abdominal distention. Negative for abdominal pain and constipation.  Genitourinary: Negative for difficulty urinating.  Musculoskeletal: Negative for back pain.  Neurological: Negative for weakness.  Psychiatric/Behavioral: Negative for behavioral problems and confusion.    Vital Signs: BP 138/81 mmHg  Pulse 97  Temp(Src) 97.5 F (36.4 C) (Oral)  Resp 18  Ht 5\' 4"  (1.626 m)  Wt 58.968 kg (130 lb)  BMI 22.30 kg/m2  SpO2 99%  LMP 10/20/2011  Physical Exam  Constitutional: She is oriented to person, place, and time. She appears well-nourished.  Cardiovascular: Normal rate, regular rhythm and normal heart sounds.   No murmur heard. Pulmonary/Chest: Effort normal and breath sounds normal. She has no wheezes.  Abdominal: Soft. Bowel sounds are normal. She exhibits distension. There is no tenderness.  Musculoskeletal: Normal range of motion.  Neurological: She is alert and oriented to person, place, and time.  Skin: Skin is warm and dry.  Psychiatric: She has a normal mood and affect. Her behavior is normal. Judgment and thought content normal.  Nursing note and vitals reviewed.   Imaging: Ct Abdomen Pelvis W Contrast  05/29/2014   CLINICAL DATA:  Abdominal bloating for 3 years ; nausea   EXAM: CT ABDOMEN AND PELVIS WITH CONTRAST  TECHNIQUE: Multidetector CT imaging of the abdomen and pelvis was performed using the standard protocol following bolus administration of intravenous contrast. Oral contrast was also administered.  CONTRAST:  174mL OMNIPAQUE IOHEXOL 300 MG/ML  SOLN  COMPARISON:  None.  FINDINGS: Lung bases are clear.  Breast implants are noted bilaterally.  There is a 5 mm focus of decreased attenuation in the lateral segment left lobe of the liver which probably represents either a small cyst or hamartoma. There is a 7 mm presumed hemangioma in the anterior segment right lobe of the liver. No other focal liver lesions are identified. Gallbladder wall is not thickened. There is no biliary duct dilatation.  Spleen, pancreas, adrenals appear normal. Kidneys bilaterally show no mass or hydronephrosis on either side. There is no renal or ureteral calculus on either side.  In the pelvis, the urinary bladder is midline in with wall thickness within normal limits. There is no demonstrable pelvic mass or pelvic fluid collection.  In the left mid abdomen, there is a solid somewhat lobulated mass measuring 8.1 x 7.9 x 4.5 cm. This mass abuts but does not obstruct adjacent bowel. This mass appear somewhat infiltrated along its left inferolateral border. No similar masses are seen elsewhere in the abdomen or pelvis.  There is no bowel obstruction.  No free air or portal venous air.  There is no demonstrable ascites. There is no demonstrable adenopathy or abscess in the abdomen or pelvis. There is no appreciable abdominal aortic aneurysm. The psoas muscles appear symmetric and normal bilaterally. There are no blastic or lytic bone lesions.  IMPRESSION: There is a large solid mass arising from the left mid abdomen which appears intraperitoneal. This mass abuts but does not obstruct or obviously invade the adjacent bowel. This mass, however, does have irregular borders along its left inferior aspect  suggesting infiltrative characteristics. This mass is concerning for neoplastic etiology. A lesion in the sarcoma family is felt to be most likely, although an atypical appearance of lymphoma could present in this manner. An atypical schwannoma or neurilemmoma could present in this manner and is a less likely differential consideration. Tissue sampling will be advisable in this regard.  Subcentimeter liver lesions are felt to most likely have benign etiology. No adenopathy is appreciable.  No bowel obstruction.  No abscess.  Study otherwise unremarkable.  These results will be called to the ordering clinician or representative by the Radiologist Assistant, and communication documented in the PACS or zVision Dashboard.   Electronically Signed   By: Lowella Grip M.D.   On: 05/29/2014 16:22    Labs:  CBC:  Recent Labs  05/28/14 1427 06/04/14 0748  WBC 8.5 6.0  HGB 14.2 15.0  HCT 41.2 44.7  PLT 254.0 227    COAGS:  Recent Labs  06/04/14 0748  INR 0.98  APTT 29    BMP:  Recent Labs  05/28/14 1427  NA 140  K 4.7  CL 103  CO2 32  GLUCOSE 93  BUN 17  CALCIUM 10.2  CREATININE 0.76    LIVER FUNCTION TESTS:  Recent Labs  05/28/14 1427  BILITOT 0.4  AST 21  ALT 17  ALKPHOS 101  PROT 7.2  ALBUMIN 4.9    TUMOR MARKERS: No results for input(s): AFPTM, CEA, CA199, CHROMGRNA in the last 8760 hours.  Assessment and Plan:  Abdominal bloating abd mass on CT scan Now scheduled for biopsy Pt aware of procedure benefits and risks including but not limited to: Infection; bleeding; organ damage Agreeable to proceed Consent signed andin chart  Thank you for this interesting consult.  I greatly enjoyed meeting KAHLIYA FRALEIGH and look forward to participating in their care.  Signed: Tiya Schrupp A 06/04/2014, 9:25 AM   I spent a total of 20 minutes face to face in clinical consultation, greater than 50% of which was counseling/coordinating care for abd mass bx

## 2014-06-04 NOTE — Sedation Documentation (Signed)
Meghan held pressure to site, pt tolerating well, Bandaid applied to LUQ.

## 2014-06-04 NOTE — Discharge Instructions (Signed)
Biopsy Care After Refer to this sheet in the next few weeks. These instructions provide you with information on caring for yourself after your procedure. Your caregiver may also give you more specific instructions. Your treatment has been planned according to current medical practices, but problems sometimes occur. Call your caregiver if you have any problems or questions after your procedure. If you had a fine needle biopsy, you may have soreness at the biopsy site for 1 to 2 days. If you had an open biopsy, you may have soreness at the biopsy site for 3 to 4 days. HOME CARE INSTRUCTIONS   You may resume normal diet and activities as directed.  Change bandages (dressings) as directed. If your wound was closed with a skin glue (adhesive), it will wear off and begin to peel in 7 days.  Only take over-the-counter or prescription medicines for pain, discomfort, or fever as directed by your caregiver.  Ask your caregiver when you can bathe and get your wound wet. SEEK IMMEDIATE MEDICAL CARE IF:   You have increased bleeding (more than a small spot) from the biopsy site.  You notice redness, swelling, or increasing pain at the biopsy site.  You have pus coming from the biopsy site.  You have a fever.  You notice a bad smell coming from the biopsy site or dressing.  You have a rash, have difficulty breathing, or have any allergic problems. MAKE SURE YOU:   Understand these instructions.  Will watch your condition.  Will get help right away if you are not doing well or get worse. Document Released: 10/30/2004 Document Revised: 07/05/2011 Document Reviewed: 10/08/2010 ExitCare Patient Information 2015 [employer]. This information is not intended to replace advice given to you by your health care provider. Make sure you discuss any questions you have with your health care provider.  

## 2014-06-04 NOTE — Sedation Documentation (Signed)
Dr. Kathlene Cote obtaining samples, pt stated she could feel it. More sedation ordered.

## 2014-06-04 NOTE — Sedation Documentation (Signed)
Patient is resting comfortably. Denied any pain at this time.

## 2014-06-04 NOTE — Sedation Documentation (Signed)
No c/o pain at this point.

## 2014-06-04 NOTE — Sedation Documentation (Signed)
Patient denies pain and is resting comfortably.  

## 2014-06-04 NOTE — Sedation Documentation (Addendum)
Pt in rad nursing station, Dr. Kathlene Cote s/w pt and husband. Questions answered.

## 2015-04-27 HISTORY — PX: INCISIONAL HERNIA REPAIR: SHX193

## 2016-04-26 DIAGNOSIS — I252 Old myocardial infarction: Secondary | ICD-10-CM

## 2016-04-26 DIAGNOSIS — C8330 Diffuse large B-cell lymphoma, unspecified site: Secondary | ICD-10-CM

## 2016-04-26 HISTORY — DX: Old myocardial infarction: I25.2

## 2016-04-26 HISTORY — DX: Diffuse large B-cell lymphoma, unspecified site: C83.30

## 2016-04-26 HISTORY — PX: OTHER SURGICAL HISTORY: SHX169

## 2017-03-04 DIAGNOSIS — I219 Acute myocardial infarction, unspecified: Secondary | ICD-10-CM | POA: Insufficient documentation

## 2019-03-05 LAB — HM MAMMOGRAPHY

## 2019-03-12 DIAGNOSIS — I2510 Atherosclerotic heart disease of native coronary artery without angina pectoris: Secondary | ICD-10-CM | POA: Insufficient documentation

## 2019-03-30 ENCOUNTER — Other Ambulatory Visit: Payer: Self-pay

## 2019-04-02 ENCOUNTER — Encounter: Payer: Self-pay | Admitting: Family Medicine

## 2019-04-02 ENCOUNTER — Ambulatory Visit (INDEPENDENT_AMBULATORY_CARE_PROVIDER_SITE_OTHER): Payer: Medicare Other | Admitting: Family Medicine

## 2019-04-02 ENCOUNTER — Other Ambulatory Visit: Payer: Self-pay

## 2019-04-02 VITALS — BP 126/81 | HR 62 | Temp 98.5°F | Resp 16 | Ht 64.0 in | Wt 135.2 lb

## 2019-04-02 DIAGNOSIS — E7800 Pure hypercholesterolemia, unspecified: Secondary | ICD-10-CM

## 2019-04-02 DIAGNOSIS — Z9861 Coronary angioplasty status: Secondary | ICD-10-CM | POA: Diagnosis not present

## 2019-04-02 DIAGNOSIS — Z0000 Encounter for general adult medical examination without abnormal findings: Secondary | ICD-10-CM

## 2019-04-02 DIAGNOSIS — Z8571 Personal history of Hodgkin lymphoma: Secondary | ICD-10-CM

## 2019-04-02 DIAGNOSIS — I252 Old myocardial infarction: Secondary | ICD-10-CM | POA: Diagnosis not present

## 2019-04-02 DIAGNOSIS — I2510 Atherosclerotic heart disease of native coronary artery without angina pectoris: Secondary | ICD-10-CM

## 2019-04-02 DIAGNOSIS — I10 Essential (primary) hypertension: Secondary | ICD-10-CM

## 2019-04-02 DIAGNOSIS — Z1211 Encounter for screening for malignant neoplasm of colon: Secondary | ICD-10-CM

## 2019-04-02 MED ORDER — ALPRAZOLAM 0.5 MG PO TABS: 0.5000 mg | ORAL_TABLET | Freq: Three times a day (TID) | ORAL | 1 refills | Status: DC

## 2019-04-02 NOTE — Patient Instructions (Signed)
Health Maintenance, Female Adopting a healthy lifestyle and getting preventive care are important in promoting health and wellness. Ask your health care provider about:  The right schedule for you to have regular tests and exams.  Things you can do on your own to prevent diseases and keep yourself healthy. What should I know about diet, weight, and exercise? Eat a healthy diet   Eat a diet that includes plenty of vegetables, fruits, low-fat dairy products, and lean protein.  Do not eat a lot of foods that are high in solid fats, added sugars, or sodium. Maintain a healthy weight Body mass index (BMI) is used to identify weight problems. It estimates body fat based on height and weight. Your health care provider can help determine your BMI and help you achieve or maintain a healthy weight. Get regular exercise Get regular exercise. This is one of the most important things you can do for your health. Most adults should:  Exercise for at least 150 minutes each week. The exercise should increase your heart rate and make you sweat (moderate-intensity exercise).  Do strengthening exercises at least twice a week. This is in addition to the moderate-intensity exercise.  Spend less time sitting. Even light physical activity can be beneficial. Watch cholesterol and blood lipids Have your blood tested for lipids and cholesterol at 58 years of age, then have this test every 5 years. Have your cholesterol levels checked more often if:  Your lipid or cholesterol levels are high.  You are older than 58 years of age.  You are at high risk for heart disease. What should I know about cancer screening? Depending on your health history and family history, you may need to have cancer screening at various ages. This may include screening for:  Breast cancer.  Cervical cancer.  Colorectal cancer.  Skin cancer.  Lung cancer. What should I know about heart disease, diabetes, and high blood  pressure? Blood pressure and heart disease  High blood pressure causes heart disease and increases the risk of stroke. This is more likely to develop in people who have high blood pressure readings, are of African descent, or are overweight.  Have your blood pressure checked: ? Every 3-5 years if you are 18-39 years of age. ? Every year if you are 40 years old or older. Diabetes Have regular diabetes screenings. This checks your fasting blood sugar level. Have the screening done:  Once every three years after age 40 if you are at a normal weight and have a low risk for diabetes.  More often and at a younger age if you are overweight or have a high risk for diabetes. What should I know about preventing infection? Hepatitis B If you have a higher risk for hepatitis B, you should be screened for this virus. Talk with your health care provider to find out if you are at risk for hepatitis B infection. Hepatitis C Testing is recommended for:  Everyone born from 1945 through 1965.  Anyone with known risk factors for hepatitis C. Sexually transmitted infections (STIs)  Get screened for STIs, including gonorrhea and chlamydia, if: ? You are sexually active and are younger than 58 years of age. ? You are older than 58 years of age and your health care provider tells you that you are at risk for this type of infection. ? Your sexual activity has changed since you were last screened, and you are at increased risk for chlamydia or gonorrhea. Ask your health care provider if   you are at risk.  Ask your health care provider about whether you are at high risk for HIV. Your health care provider may recommend a prescription medicine to help prevent HIV infection. If you choose to take medicine to prevent HIV, you should first get tested for HIV. You should then be tested every 3 months for as long as you are taking the medicine. Pregnancy  If you are about to stop having your period (premenopausal) and  you may become pregnant, seek counseling before you get pregnant.  Take 400 to 800 micrograms (mcg) of folic acid every day if you become pregnant.  Ask for birth control (contraception) if you want to prevent pregnancy. Osteoporosis and menopause Osteoporosis is a disease in which the bones lose minerals and strength with aging. This can result in bone fractures. If you are 65 years old or older, or if you are at risk for osteoporosis and fractures, ask your health care provider if you should:  Be screened for bone loss.  Take a calcium or vitamin D supplement to lower your risk of fractures.  Be given hormone replacement therapy (HRT) to treat symptoms of menopause. Follow these instructions at home: Lifestyle  Do not use any products that contain nicotine or tobacco, such as cigarettes, e-cigarettes, and chewing tobacco. If you need help quitting, ask your health care provider.  Do not use street drugs.  Do not share needles.  Ask your health care provider for help if you need support or information about quitting drugs. Alcohol use  Do not drink alcohol if: ? Your health care provider tells you not to drink. ? You are pregnant, may be pregnant, or are planning to become pregnant.  If you drink alcohol: ? Limit how much you use to 0-1 drink a day. ? Limit intake if you are breastfeeding.  Be aware of how much alcohol is in your drink. In the [place] one drink equals one 12 oz bottle of beer (355 mL), one 5 oz glass of wine (148 mL), or one 1 oz glass of hard liquor (44 mL). General instructions  Schedule regular health, dental, and eye exams.  Stay current with your vaccines.  Tell your health care provider if: ? You often feel depressed. ? You have ever been abused or do not feel safe at home. Summary  Adopting a healthy lifestyle and getting preventive care are important in promoting health and wellness.  Follow your health care provider's instructions about healthy  diet, exercising, and getting tested or screened for diseases.  Follow your health care provider's instructions on monitoring your cholesterol and blood pressure. This information is not intended to replace advice given to you by your health care provider. Make sure you discuss any questions you have with your health care provider. Document Released: 10/26/2010 Document Revised: 04/05/2018 Document Reviewed: 04/05/2018 Elsevier Patient Education  2020 [employer]  

## 2019-04-02 NOTE — Progress Notes (Signed)
Office Note 04/02/2019  CC:  Chief Complaint  Patient presents with  . Establish Care    Previous PCP @ [facility]   HPI:  Gabrielle [name] is a 58 y.o. female who is here to establish care, f/u HLD and CAD/hx of MI, health maintenance exam. Patient's most recent primary MD: see above. Old records in EPIC/HL EMR were reviewed prior to or during today's visit.  She does feel fine.  No exercise.  Diet "could be better".  Has night sweats, started black cohosh being tried the last 3 wks.  She declines flu vaccine. Shingrix UTd--12/2018  Anxiety/bipolar: taking 1.5 alpraz 0.70m qhs, works fine.  No advers side effects. PMP AWARE reviewed today: most recent alpraz rx filled 01/22/19, #270, [facility]  No red flags.  F/u's of HTN hisorically controlled.  No home bp monitoring. HLD: taking statin daily.  Past Medical History:  Diagnosis Date  . Anxiety and depression   . History of myocardial infarction   . 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  .Marland KitchenHx of migraines    remote past  . Hypercholesterolemia   . [place] ([place]kin's [place]   left peritoneal mesonteric mass  . Palpitations 2015   no monitoring was done-->resolved spontaneously    Past Surgical History:  Procedure Laterality Date  . biopsy  2016   Initial bx needle; [facility] exp lap--complications--sepsis/hemorrhage--memory/cognitive difficulties since that surgery/hospitalization--details somewhat cloudy from pt's info.  .Marland KitchenBREAST ENHANCEMENT SURGERY    . Heart Stent  2018   Prox LAD (murrells inlet).  .Fatima BlankHERNIA REPAIR  2017   w/mesh  . TONSILLECTOMY    . TRANSTHORACIC ECHOCARDIOGRAM  09/2013   2015 ALL NORMAL.  2020 normal per pt report (Drr. Applegate with [facility] [place].  . WISDOM TOOTH EXTRACTION      Family History  Problem Relation Age of Onset  .  Heart disease Mother   . Emphysema Mother   . Hypertension Mother   . Stroke Mother   . Depression Mother   . Heart failure Mother   . Hypertension Brother   . Depression Brother   . Stroke Maternal Aunt   . Depression Maternal Aunt   . CAD Maternal Aunt   . Colon cancer Other     Social History   Socioeconomic History  . Marital status: Married    Spouse name: Not on file  . Number of children: 0  . Years of education: Not on file  . Highest education level: Not on file  Occupational History  . Occupation: Disability  Social Needs  . Financial resource strain: Not on file  . Food insecurity    Worry: Not on file    Inability: Not on file  . Transportation needs    Medical: Not on file    Non-medical: Not on file  Tobacco Use  . Smoking status: Never Smoker  . Smokeless tobacco: Never Used  Substance and Sexual Activity  . Alcohol use: No  . Drug use: No  . Sexual activity: Yes    Birth control/protection: Condom  Lifestyle  . Physical activity    Days per week: Not on file    Minutes per session: Not on file  . Stress: Not on file  Relationships  . Social connections    Talks on phone: Not on file  Gets together: Not on file    Attends religious service: Not on file    Active member of club or organization: Not on file    Attends meetings of clubs or organizations: Not on file    Relationship status: Not on file  . Intimate partner violence    Fear of current or ex partner: Not on file    Emotionally abused: Not on file    Physically abused: Not on file    Forced sexual activity: Not on file  Other Topics Concern  . Not on file  Social History Narrative   Married, no children.   Relocated from [place] about 10/2018.   Educ: BA [employer], grad w/honors.   [employer] and others prior to disability.   Disabled due to bipolar, depression 2012.   No tob, alc.   No drug use.    Outpatient Encounter Medications as of 04/02/2019  Medication  Sig  . ALPRAZolam (XANAX) 0.5 MG tablet Take 1 tablet (0.5 mg total) by mouth 3 (three) times daily.  Marland Kitchen aspirin EC 81 MG tablet Take by mouth.  Marland Kitchen atorvastatin (LIPITOR) 80 MG tablet Take 80 mg by mouth daily.  Marland Kitchen BLACK COHOSH PO Take by mouth daily.  Marland Kitchen lamoTRIgine (LAMICTAL) 200 MG tablet Take 200 mg by mouth daily.   . Multiple Vitamins-Minerals (MULTIVITAMIN ADULT PO) Take by mouth daily.  Marland Kitchen OMEPRAZOLE PO Take 40 mg by mouth daily.  Marland Kitchen PARoxetine (PAXIL) 40 MG tablet Take 40 mg by mouth daily.  . Probiotic Product (PROBIOTIC PO) Take by mouth daily.  . SUCRALFATE PO Take 1 g by mouth 2 (two) times daily. Take 1 before meals and at bedtime.  . [DISCONTINUED] ALPRAZolam (XANAX) 0.5 MG tablet Take 0.5 mg by mouth 3 (three) times daily.  . [DISCONTINUED] Metoprolol-Hydrochlorothiazide 25-12.5 MG TB24 daily.  . Metoprolol Succinate 25 MG CS24 Take 12.5 mg by mouth daily. Take 1/2 tablet daily.  Marland Kitchen Zoster Vaccine Adjuvanted [facility]) injection Shingrix (PF) 50 mcg/0.5 mL intramuscular suspension, kit  PHARMACY ADMINISTERED  . [DISCONTINUED] acetaminophen (TYLENOL) 500 MG tablet Take 500 mg by mouth every 6 (six) hours as needed for headache (pain).  . [DISCONTINUED] ALPRAZolam (XANAX) 0.5 MG tablet Take 0.5 mg by mouth 3 (three) times daily as needed for anxiety.   . [DISCONTINUED] amoxicillin (AMOXIL) 500 MG capsule Take 500 mg by mouth 3 (three) times daily.  . [DISCONTINUED] aspirin-acetaminophen-caffeine (EXCEDRIN MIGRAINE) 250-250-65 MG tablet Take by mouth.  . [DISCONTINUED] Aspirin-Acetaminophen-Caffeine (GOODY HEADACHE PO) Take 1 packet by mouth daily as needed (headache).  . [DISCONTINUED] atorvastatin (LIPITOR) 80 MG tablet Take 80 mg by mouth daily.  . [DISCONTINUED] BABY ASPIRIN PO Take 80 mg by mouth daily.  . [DISCONTINUED] lamoTRIgine (LAMICTAL) 100 MG tablet Take 200 mg by mouth daily.   . [DISCONTINUED] PARoxetine (PAXIL) 40 MG tablet Take 40 mg by mouth every morning.   No  facility-administered encounter medications on file as of 04/02/2019.     Allergies  Allergen Reactions  . Sulfa Antibiotics Rash    ROS Review of Systems  Constitutional: Negative for appetite change, chills, fatigue and fever.  HENT: Negative for congestion, dental problem, ear pain and sore throat.   Eyes: Negative for discharge, redness and visual disturbance.  Respiratory: Negative for cough, chest tightness, shortness of breath and wheezing.   Cardiovascular: Negative for chest pain, palpitations and leg swelling.  Gastrointestinal: Negative for abdominal pain, blood in stool, diarrhea, nausea and vomiting.  Genitourinary: Negative  for difficulty urinating, dysuria, flank pain, frequency, hematuria and urgency.  Musculoskeletal: Negative for arthralgias, back pain, joint swelling, myalgias and neck stiffness.  Skin: Negative for pallor and rash.  Neurological: Negative for dizziness, speech difficulty, weakness and headaches.  Hematological: Negative for adenopathy. Does not bruise/bleed easily.  Psychiatric/Behavioral: Negative for confusion and sleep disturbance. The patient is not nervous/anxious.     PE; Blood pressure 126/81, pulse 62, temperature 98.5 F (36.9 C), temperature source Temporal, resp. rate 16, height '5\' 4"'  (1.626 m), weight 135 lb 3.2 oz (61.3 kg), last menstrual period 10/20/2011, SpO2 97 %. Exam chaperoned by Deveron Furlong, CMA. Gen: Alert, well appearing.  Patient is oriented to person, place, time, and situation. AFFECT: pleasant, lucid thought and speech. ENT: Ears: EACs clear, normal epithelium.  TMs with good light reflex and landmarks bilaterally.  Eyes: no injection, icteris, swelling, or exudate.  EOMI, PERRLA. Nose: no drainage or turbinate edema/swelling.  No injection or focal lesion.  Mouth: lips without lesion/swelling.  Oral mucosa pink and moist.  Dentition intact and without obvious caries or gingival swelling.  Oropharynx without erythema,  exudate, or swelling.  Neck: supple/nontender.  No LAD, mass, or TM.  Carotid pulses 2+ bilaterally, without bruits. CV: RRR, no m/r/g.   LUNGS: CTA bilat, nonlabored resps, good aeration in all lung fields. ABD: soft, NT, ND, BS normal.  No hepatospenomegaly or mass.  No bruits. EXT: no clubbing, cyanosis, or edema.  Musculoskeletal: no joint swelling, erythema, warmth, or tenderness.  ROM of all joints intact. Skin - no sores or suspicious lesions or rashes or color changes   Pertinent labs:  Lab Results  Component Value Date   TSH 1.60 05/28/2014   Lab Results  Component Value Date   WBC 6.0 06/04/2014   HGB 15.0 06/04/2014   HCT 44.7 06/04/2014   MCV 97.0 06/04/2014   PLT 227 06/04/2014   Lab Results  Component Value Date   CREATININE 0.76 05/28/2014   BUN 17 05/28/2014   NA 140 05/28/2014   K 4.7 05/28/2014   CL 103 05/28/2014   CO2 32 05/28/2014   Lab Results  Component Value Date   ALT 17 05/28/2014   AST 21 05/28/2014   ALKPHOS 101 05/28/2014   BILITOT 0.4 05/28/2014   Lab Results  Component Value Date   CHOL 175 05/28/2014   Lab Results  Component Value Date   HDL 56.40 05/28/2014   Lab Results  Component Value Date   LDLCALC 89 05/28/2014   Lab Results  Component Value Date   TRIG 146.0 05/28/2014   Lab Results  Component Value Date   CHOLHDL 3 05/28/2014    ASSESSMENT AND PLAN:   New pt: obtain Dr. Gregor Hams labs done at recent GYN visit.  Obtain last 1 yr of records/labs from prior PCP.  1) HTN: The current medical regimen is effective;  continue present plan and medications. If not bmet done at gyn office then we'll get one.  2) HLD: tolerating statin.  Will get labs from GYN MD, if no FLP or hepatic panel then will get one.  3) Bipolar d/o, recurrent mDD, GAD-->disabled.  Continue lamictal, paxil, alprazolam (eRx'd alpr 0.86m tid prn, #270, RF x 1).  4) CAD, hx of MI with preserved LV function: asymptomatic. Continue ASA, BB,  statin. Increase exercise and improve diet.  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 We'll monitor for fevers, nightsweats, fatigue/malaise or cbc abnormalities.  6) Preventative health care: she declined  flu and Tdap vaccines today.  7) Health maintenance exam: Reviewed age and gender appropriate health maintenance issues (prudent diet, regular exercise, health risks of tobacco and excessive alcohol, use of seatbelts, fire alarms in home, use of sunscreen).  Also reviewed age and gender appropriate health screening as well as vaccine recommendations. Vaccines: declined tdap and flu.  UTD on shingrix. Labs: see above probs for details. Cerv and breast ca screening : UTD this year--GYN. Colon ca screening:  cologuard today.  An After Visit Summary was printed and given to the patient.  Return in about 6 months (around 10/01/2019) for routine chronic illness f/u.  Signed:  Crissie Sickles, MD           04/02/2019

## 2019-04-12 ENCOUNTER — Ambulatory Visit: Payer: Medicare Other | Admitting: Family Medicine

## 2019-04-17 ENCOUNTER — Encounter: Payer: Self-pay | Admitting: Family Medicine

## 2019-04-27 DIAGNOSIS — Z1211 Encounter for screening for malignant neoplasm of colon: Secondary | ICD-10-CM

## 2019-04-27 HISTORY — DX: Encounter for screening for malignant neoplasm of colon: Z12.11

## 2019-05-02 DIAGNOSIS — Z1211 Encounter for screening for malignant neoplasm of colon: Secondary | ICD-10-CM | POA: Diagnosis not present

## 2019-05-10 ENCOUNTER — Telehealth: Payer: Self-pay | Admitting: Family Medicine

## 2019-05-10 LAB — COLOGUARD: Cologuard: NEGATIVE

## 2019-05-10 NOTE — Telephone Encounter (Signed)
LM for pt that our marketing department saw her Facebook post. I looked into her visit and sent to coders to review. We have sent back to insurance for an adjustment. She does not currently have a have a bill.

## 2019-05-10 NOTE — Telephone Encounter (Signed)
Patient's spouse called in earlier this morning to inquire about a billing issue.  I called him back this afternoon and left a message on his voice mail, per his request, to let him know coders reviewed the visit and the claim has been sent back to insurance for an adjustment and Mrs. Dalman does not currently have a bill.

## 2019-05-14 ENCOUNTER — Encounter: Payer: Self-pay | Admitting: Family Medicine

## 2019-05-14 ENCOUNTER — Other Ambulatory Visit: Payer: Self-pay

## 2019-05-14 DIAGNOSIS — Z1211 Encounter for screening for malignant neoplasm of colon: Secondary | ICD-10-CM

## 2019-05-14 NOTE — Addendum Note (Signed)
Addended by: Gerilyn Nestle on: 05/14/2019 09:26 AM   Modules accepted: Orders

## 2019-05-23 ENCOUNTER — Encounter: Payer: Self-pay | Admitting: Family Medicine

## 2019-06-05 DIAGNOSIS — L821 Other seborrheic keratosis: Secondary | ICD-10-CM | POA: Diagnosis not present

## 2019-06-05 DIAGNOSIS — D229 Melanocytic nevi, unspecified: Secondary | ICD-10-CM | POA: Diagnosis not present

## 2019-06-19 ENCOUNTER — Encounter: Payer: Self-pay | Admitting: Family Medicine

## 2019-06-19 ENCOUNTER — Telehealth: Payer: Self-pay

## 2019-06-19 MED ORDER — SUCRALFATE 1 G PO TABS: ORAL_TABLET | ORAL | 1 refills | Status: DC

## 2019-06-19 MED ORDER — OMEPRAZOLE 40 MG PO CPDR: 40.0000 mg | DELAYED_RELEASE_CAPSULE | Freq: Every day | ORAL | 3 refills | Status: DC

## 2019-06-19 NOTE — Telephone Encounter (Signed)
I'll RF these meds. Tell pt that I know these meds have been rx'd for her in the past by her prior doctor, but if she has been off these meds and is wanted to restart them b/c of developing new abd pain/reflux then I need to see her for o/v in the next week or so. If no new/acute sx's like this and these rx's are for continuing meds that she has consistently been taking then keeping plan for f/u in June is fine.  Let me know-thx

## 2019-06-19 NOTE — Telephone Encounter (Signed)
LM for pt to returncall

## 2019-06-19 NOTE — Telephone Encounter (Signed)
RF request for Sucralfate and omeprazole LOV:04/02/2019 Next ov: 10/03/2019 Last written:nevery prescribed

## 2019-06-19 NOTE — Telephone Encounter (Signed)
Patient is not having any new or developing symptoms and has not gone off of either medications since prescribed by prior doctor. Advised to f/u as planned in June

## 2019-06-20 NOTE — Telephone Encounter (Signed)
Pt notified yesterday.

## 2019-06-20 NOTE — Telephone Encounter (Signed)
OK, meds have been eRx'd already.

## 2019-06-26 ENCOUNTER — Other Ambulatory Visit: Payer: Self-pay

## 2019-06-26 ENCOUNTER — Telehealth: Payer: Self-pay

## 2019-06-26 NOTE — Telephone Encounter (Signed)
Received voicemail from pt's mail order pharmacy, [facility] requesting refills for 2 meds, Omeprazole and Sucralfate. Both medications were sent on 2/23 to local pharmacy. LM for pt to return call if meds needs to be be resent to [facility].

## 2019-06-27 ENCOUNTER — Other Ambulatory Visit: Payer: Self-pay

## 2019-06-27 MED ORDER — OMEPRAZOLE 40 MG PO CPDR: 40.0000 mg | DELAYED_RELEASE_CAPSULE | Freq: Every day | ORAL | 3 refills | Status: DC

## 2019-06-27 MED ORDER — SUCRALFATE 1 G PO TABS: ORAL_TABLET | ORAL | 1 refills | Status: DC

## 2019-06-27 NOTE — Telephone Encounter (Signed)
Pt returned call and confirmed she would like to use [facility] for both medications. Contacted the drug store pharmacy and cancelled refills sent there. Resent to [facility]

## 2019-07-23 ENCOUNTER — Other Ambulatory Visit: Payer: Self-pay

## 2019-07-23 ENCOUNTER — Telehealth: Payer: Self-pay

## 2019-07-23 MED ORDER — ATORVASTATIN CALCIUM 80 MG PO TABS: 80.0000 mg | ORAL_TABLET | Freq: Every day | ORAL | 3 refills | Status: DC

## 2019-07-23 NOTE — Telephone Encounter (Signed)
RF request for Atorvastatin LOV:04/02/19 Next ov: 10/03/19 Last written: n/a  Not previously prescribed by you, medication pending.

## 2019-07-23 NOTE — Telephone Encounter (Signed)
Patient refill request  atorvastatin (LIPITOR) 80 MG tablet [id number] d/s  [place]  Please make sure this goes to mail order and not to [facility] in [place] as it was sent in error last time per patient

## 2019-07-23 NOTE — Telephone Encounter (Signed)
PCP sent refill request thru refill med tab.

## 2019-07-24 NOTE — Telephone Encounter (Signed)
RX sent

## 2019-08-02 ENCOUNTER — Other Ambulatory Visit: Payer: Self-pay

## 2019-08-02 MED ORDER — ATORVASTATIN CALCIUM 80 MG PO TABS: 80.0000 mg | ORAL_TABLET | Freq: Every day | ORAL | 3 refills | Status: DC

## 2019-08-06 ENCOUNTER — Other Ambulatory Visit: Payer: Self-pay

## 2019-08-06 MED ORDER — METOPROLOL SUCCINATE 25 MG PO CS24: 12.5000 mg | EXTENDED_RELEASE_CAPSULE | Freq: Every day | ORAL | 3 refills | Status: DC

## 2019-08-06 NOTE — Telephone Encounter (Signed)
RF request for Metoprolol LOV:04/02/19 Next ov: 10/03/19 Last written: n/a  Medication not previously prescribed by you, currently pending for mail order.

## 2019-08-07 NOTE — Telephone Encounter (Signed)
Refill sent.

## 2019-08-09 ENCOUNTER — Other Ambulatory Visit: Payer: Self-pay

## 2019-08-09 MED ORDER — METOPROLOL SUCCINATE 25 MG PO CS24: 12.5000 mg | EXTENDED_RELEASE_CAPSULE | Freq: Every day | ORAL | 3 refills | Status: DC

## 2019-08-13 ENCOUNTER — Other Ambulatory Visit: Payer: Self-pay

## 2019-08-13 MED ORDER — METOPROLOL SUCCINATE 25 MG PO CS24: 12.5000 mg | EXTENDED_RELEASE_CAPSULE | Freq: Every day | ORAL | 3 refills | Status: DC

## 2019-08-13 NOTE — Telephone Encounter (Signed)
Pharmacy needs clarification regarding sig directions" Take 12.5mg  by mouth daily." During pt's last visit on 12/7/250, advised to continue current medications. Medication is not designed to be split and cutting may alter its release. Please clarify directions and strength

## 2019-08-13 NOTE — Telephone Encounter (Signed)
Noted.  I understand. I still want the patient to take 1/2 tab a day. I put a note in "note for pharmacy" section of the sig this time.

## 2019-08-14 ENCOUNTER — Telehealth: Payer: Self-pay

## 2019-08-14 ENCOUNTER — Other Ambulatory Visit: Payer: Self-pay

## 2019-08-14 MED ORDER — METOPROLOL SUCCINATE 25 MG PO CS24: 12.5000 mg | EXTENDED_RELEASE_CAPSULE | Freq: Every day | ORAL | 0 refills | Status: DC

## 2019-08-14 NOTE — Telephone Encounter (Signed)
Patient needs quantity of #5 tablets of meds until prescription arrives thru mail order pharmacy. Please call in today. Patient is out of meds.   Metoprolol Succinate 25 MG CS24 [id number]   [place], [place] [facility] [place]

## 2019-08-14 NOTE — Telephone Encounter (Signed)
RF sent to local pharmacy, pt notified.

## 2019-09-18 DIAGNOSIS — H5203 Hypermetropia, bilateral: Secondary | ICD-10-CM | POA: Diagnosis not present

## 2019-09-18 DIAGNOSIS — H52223 Regular astigmatism, bilateral: Secondary | ICD-10-CM | POA: Diagnosis not present

## 2019-09-26 ENCOUNTER — Other Ambulatory Visit: Payer: Self-pay

## 2019-09-26 ENCOUNTER — Other Ambulatory Visit: Payer: Self-pay | Admitting: Family Medicine

## 2019-09-26 MED ORDER — PAROXETINE HCL 40 MG PO TABS: 40.0000 mg | ORAL_TABLET | Freq: Every day | ORAL | 3 refills | Status: DC

## 2019-09-26 MED ORDER — PAROXETINE HCL 40 MG PO TABS: 40.0000 mg | ORAL_TABLET | Freq: Every day | ORAL | 0 refills | Status: DC

## 2019-09-26 MED ORDER — LAMOTRIGINE 200 MG PO TABS: 200.0000 mg | ORAL_TABLET | Freq: Every day | ORAL | 3 refills | Status: DC

## 2019-09-26 MED ORDER — LAMOTRIGINE 200 MG PO TABS: 200.0000 mg | ORAL_TABLET | Freq: Every day | ORAL | 0 refills | Status: DC

## 2019-09-26 NOTE — Telephone Encounter (Signed)
Patient called yesterday regarding 2 prescriptions we were supposed to receive from [facility]. We have not received anything or have pending medications for her. LM for her to return call to further assist.

## 2019-09-26 NOTE — Telephone Encounter (Signed)
Paroxetine and Lamotrigine were previously prescribed by other primary care. She would like to take over refills. Her next appt is 10/03/19. She needs 14 day supply sent to local pharmacy for now and 90 day to mail order. Medications currently pending

## 2019-09-27 NOTE — Telephone Encounter (Signed)
Patient is aware we will do a short term supply to local pharmacy.

## 2019-10-03 ENCOUNTER — Encounter: Payer: Self-pay | Admitting: Family Medicine

## 2019-10-03 ENCOUNTER — Other Ambulatory Visit: Payer: Self-pay

## 2019-10-03 ENCOUNTER — Ambulatory Visit (INDEPENDENT_AMBULATORY_CARE_PROVIDER_SITE_OTHER): Payer: Medicare HMO | Admitting: Family Medicine

## 2019-10-03 VITALS — BP 129/83 | HR 64 | Temp 98.8°F | Resp 16 | Ht 64.0 in | Wt 140.6 lb

## 2019-10-03 DIAGNOSIS — E7800 Pure hypercholesterolemia, unspecified: Secondary | ICD-10-CM | POA: Diagnosis not present

## 2019-10-03 DIAGNOSIS — I2510 Atherosclerotic heart disease of native coronary artery without angina pectoris: Secondary | ICD-10-CM

## 2019-10-03 DIAGNOSIS — F411 Generalized anxiety disorder: Secondary | ICD-10-CM

## 2019-10-03 DIAGNOSIS — I10 Essential (primary) hypertension: Secondary | ICD-10-CM

## 2019-10-03 DIAGNOSIS — F3170 Bipolar disorder, currently in remission, most recent episode unspecified: Secondary | ICD-10-CM | POA: Diagnosis not present

## 2019-10-03 DIAGNOSIS — C8590 Non-Hodgkin lymphoma, unspecified, unspecified site: Secondary | ICD-10-CM | POA: Diagnosis not present

## 2019-10-03 MED ORDER — METOPROLOL SUCCINATE 25 MG PO CS24: 12.5000 mg | EXTENDED_RELEASE_CAPSULE | Freq: Every day | ORAL | 3 refills | Status: DC

## 2019-10-03 MED ORDER — ALPRAZOLAM 0.5 MG PO TABS: 0.5000 mg | ORAL_TABLET | Freq: Three times a day (TID) | ORAL | 1 refills | Status: DC

## 2019-10-03 NOTE — Progress Notes (Signed)
OFFICE VISIT  10/03/2019   CC:  Chief Complaint  Patient presents with  . Follow-up    RCI, pt is fasting     HPI:    Patient is a 59 y.o. female who presents for 6 mo f/u HTN, HLD, and bipolar mood disorder and anxiety disorder.  She has remote hx of CAD with hx of MI, preserved LVEF.  Psych:  Stable mood and anxiety levels.  No panic.   Taking 2 alprx hs to help anxiety-related insomnia, occ one during the daytime. Exercise put on hold for now due to heat.  Active in house, gets out and is busy.  HTN: no home bp meds.   CAD: no probs. TAking ASA, BB, statin.  HLD: takes atorva 84m qd w/out problem. Trying to eat lower carb and lower fat diet, small portion sizes.  PMP AWARE reviewed today: most recent rx for alprazolam 0.529mwas filled 04/05/19, # 270, rx by me. No red flags. [facility]SC UTD->04/02/19  HTN:  HLD:    Past Medical History:  Diagnosis Date  . Anxiety and depression   . Colon cancer screening 04/2019   Cologuard NEG.  Repeat 04/2022  . Dyspepsia   . GERD (gastroesophageal reflux disease)   . History of myocardial infarction 2018  . 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  . Marland Kitchenx of migraines    remote past  . Hypercholesterolemia   . [place] ([place]Hodgkin's [place]) [place]   left peritoneal mesonteric mass  . Palpitations 2015   no monitoring was done-->resolved spontaneously    Past Surgical History:  Procedure Laterality Date  . biopsy  2016   Initial bx needle; [facility]--complications--sepsis/hemorrhage--memory/cognitive difficulties since that surgery/hospitalization--details somewhat cloudy from pt's info.  . Marland KitchenREAST ENHANCEMENT SURGERY  2003  . Heart Stent  2018   Prox LAD (murrells inlet).  . Fatima BlankERNIA REPAIR  2017   w/mesh  . TONSILLECTOMY    . TRANSTHORACIC ECHOCARDIOGRAM  09/2013   2015 ALL NORMAL.  2020 normal per pt report (Drr.  Applegate with [facility] [place]  . WISDOM TOOTH EXTRACTION      Outpatient Medications Prior to Visit  Medication Sig Dispense Refill  . aspirin EC 81 MG tablet Take by mouth.    . Marland Kitchentorvastatin (LIPITOR) 80 MG tablet Take 1 tablet (80 mg total) by mouth daily. 90 tablet 3  . lamoTRIgine (LAMICTAL) 200 MG tablet Take 1 tablet (200 mg total) by mouth daily. 90 tablet 3  . Multiple Vitamins-Minerals (MULTIVITAMIN ADULT PO) Take by mouth daily.    . Marland Kitchenmeprazole (PRILOSEC) 40 MG capsule Take 1 capsule (40 mg total) by mouth daily. 90 capsule 3  . PARoxetine (PAXIL) 40 MG tablet Take 1 tablet (40 mg total) by mouth daily. 90 tablet 3  . Probiotic Product (PROBIOTIC PO) Take by mouth daily.    . sucralfate (CARAFATE) 1 g tablet 1 tab qAC and qhs AS NEEDED for upper abdominal discomfort 120 tablet 1  . ALPRAZolam (XANAX) 0.5 MG tablet Take 1 tablet (0.5 mg total) by mouth 3 (three) times daily. 270 tablet 1  . Metoprolol Succinate 25 MG CS24 Take 12.5 mg by mouth daily. 10 capsule 0  . BLACK COHOSH PO Take by mouth daily.    . Marland Kitchenoster Vaccine Adjuvanted ([facility] Center[facility] Shingrix (PF) 50 mcg/0.5 mL intramuscular suspension, kit  PHARMACY ADMINISTERED     No facility-administered medications prior  to visit.    Allergies  Allergen Reactions  . Sulfa Antibiotics Rash    ROS As per HPI  PE: Vitals with BMI 10/03/2019 04/02/2019 06/04/2014  Height '5\' 4"'  '5\' 4"'  -  Weight 140 lbs 10 oz 135 lbs 3 oz -  BMI 68.25 74.9 -  Systolic 355 217 471  Diastolic 83 81 67  Pulse 64 62 80    Gen: Alert, well appearing.  Patient is oriented to person, place, time, and situation. AFFECT: pleasant, lucid thought and speech. CV: RRR, no m/r/g.   LUNGS: CTA bilat, nonlabored resps, good aeration in all lung fields. EXT: no clubbing or cyanosis.  no edema.    LABS:  Lab Results  Component Value Date   TSH 1.60 05/28/2014   Lab Results  Component Value Date   WBC 6.0 06/04/2014   HGB 15.0 06/04/2014    HCT 44.7 06/04/2014   MCV 97.0 06/04/2014   PLT 227 06/04/2014   Lab Results  Component Value Date   CREATININE 0.76 05/28/2014   BUN 17 05/28/2014   NA 140 05/28/2014   K 4.7 05/28/2014   CL 103 05/28/2014   CO2 32 05/28/2014   Lab Results  Component Value Date   ALT 17 05/28/2014   AST 21 05/28/2014   ALKPHOS 101 05/28/2014   BILITOT 0.4 05/28/2014   Lab Results  Component Value Date   CHOL 175 05/28/2014   Lab Results  Component Value Date   HDL 56.40 05/28/2014   Lab Results  Component Value Date   LDLCALC 89 05/28/2014   Lab Results  Component Value Date   TRIG 146.0 05/28/2014   Lab Results  Component Value Date   CHOLHDL 3 05/28/2014    IMPRESSION AND PLAN:  1) HTN: The current medical regimen is effective;  continue present plan and medications.  2) HLD: tolerating statin. Will ask pt's GYN for their most recent labs (end of 2020 or early 2021) --pt really did not want to get any duplicate labs today.  3) Bipolar d/o and GAD: stable, doing well on current meds. No changes.  Alprazolam 0.14m, 1 tid prn, #270 (90d supply) with RF x 1 sent via mail order.  4) CAD, hx of MI, preserved LV fxn: stable. Continue ASA, BB, statin.  An After Visit Summary was printed and given to the patient.  FOLLOW UP: Return in about 6 months (around 04/03/2020) for annual CPE (fasting).  Signed:  PCrissie Sickles MD           10/03/2019

## 2019-12-25 ENCOUNTER — Ambulatory Visit (INDEPENDENT_AMBULATORY_CARE_PROVIDER_SITE_OTHER): Payer: Medicare HMO | Admitting: Family Medicine

## 2019-12-25 ENCOUNTER — Other Ambulatory Visit: Payer: Self-pay

## 2019-12-25 ENCOUNTER — Encounter: Payer: Self-pay | Admitting: Family Medicine

## 2019-12-25 VITALS — BP 129/81 | HR 66 | Temp 97.8°F | Resp 12 | Ht 64.0 in | Wt 142.6 lb

## 2019-12-25 DIAGNOSIS — R8290 Unspecified abnormal findings in urine: Secondary | ICD-10-CM

## 2019-12-25 DIAGNOSIS — R300 Dysuria: Secondary | ICD-10-CM

## 2019-12-25 MED ORDER — CEPHALEXIN 500 MG PO CAPS
500.0000 mg | ORAL_CAPSULE | Freq: Three times a day (TID) | ORAL | 0 refills | Status: DC
Start: 2019-12-25 — End: 2020-01-30

## 2019-12-25 NOTE — Progress Notes (Signed)
This visit occurred during the SARS-CoV-2 public health emergency.  Safety protocols were in place, including screening questions prior to the visit, additional usage of staff PPE, and extensive cleaning of exam room while observing appropriate contact time as indicated for disinfecting solutions.    Gabrielle [name] , 1960-12-05, 59 y.o., female MRN: [id number] Patient Care Team    Relationship Specialty Notifications Start End  McGowen, Adrian Blackwater, MD PCP - General Family Medicine  03/30/19   Louretta Shorten, MD Consulting Physician Obstetrics and Gynecology  01/31/19   Dudley Major, MD Referring Physician Internal Medicine  03/12/19   Burnell Blanks, MD Consulting Physician Cardiology  04/02/19   Milus Banister, MD Consulting Physician Gastroenterology  04/02/19     Chief Complaint  Patient presents with  . Dysuria    has been taking AZO  . Urinary Frequency     Subjective: Pt presents for an OV with complaints of urinary pressure and burning of urination for approximately 1 week in duration.  She denies fever, chills, nausea or vomit.  She denies low back pain.  She reports she started taking Azo which has helped but her symptoms continue to worsen.  She has tried to hydrate as well.  She does admit she does not drink a lot of water.  She drinks mostly sweet tea.  Depression screen PHQ 2/9 04/02/2019  Decreased Interest 0  Down, Depressed, Hopeless 0  PHQ - 2 Score 0    Allergies  Allergen Reactions  . Sulfa Antibiotics Rash   Social History   Social History Narrative   Married, no children.   Relocated from [place] about 10/2018.   Educ: BA [employer], grad w/honors.   [employer] and others prior to disability.   Disabled due to bipolar, depression 2012.   No tob, alc.   No drug use.   Past Medical History:  Diagnosis Date  . Anxiety and depression   . Colon cancer screening 04/2019   Cologuard NEG.  Repeat 04/2022  . Dyspepsia   . GERD  (gastroesophageal reflux disease)   . History of myocardial infarction 2018  . 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  Marland Kitchen Hx of migraines    remote past  . Hypercholesterolemia   . NHL (non-Hodgkin's lymphoma) ([place]) 2016   left peritoneal mesonteric mass  . Palpitations 2015   no monitoring was done-->resolved spontaneously   Past Surgical History:  Procedure Laterality Date  . biopsy  2016   Initial bx needle; then lab abd (done in [facility]) done to get further bx, exp lap--complications--sepsis/hemorrhage--memory/cognitive difficulties since that surgery/hospitalization--details somewhat cloudy from pt's info.  Marland Kitchen BREAST ENHANCEMENT SURGERY  2003  . Heart Stent  2018   Prox LAD (murrells inlet).  Fatima Blank HERNIA REPAIR  2017   w/mesh  . TONSILLECTOMY    . TRANSTHORACIC ECHOCARDIOGRAM  09/2013   2015 ALL NORMAL.  2020 normal per pt report (Drr. Applegate with [facility] [place]  . WISDOM TOOTH EXTRACTION     Family History  Problem Relation Age of Onset  . Heart disease Mother   . Emphysema Mother   . Hypertension Mother   . Stroke Mother   . Depression Mother   . Heart failure Mother   . Hypertension Brother   . Depression Brother   . Stroke Maternal Aunt   . Depression Maternal Aunt   . CAD Maternal Aunt   . Colon cancer  Other    Allergies as of 12/25/2019      Reactions   Sulfa Antibiotics Rash      Medication List       Accurate as of December 25, 2019  3:56 PM. If you have any questions, ask your nurse or doctor.        ALPRAZolam 0.5 MG tablet Commonly known as: XANAX Take 1 tablet (0.5 mg total) by mouth 3 (three) times daily.   aspirin EC 81 MG tablet Take by mouth.   atorvastatin 80 MG tablet Commonly known as: LIPITOR Take 1 tablet (80 mg total) by mouth daily.   lamoTRIgine 200 MG tablet Commonly known as: LAMICTAL Take 1 tablet (200 mg total) by mouth daily.   Metoprolol Succinate 25 MG Cs24 Take 12.5  mg by mouth daily.   MULTIVITAMIN ADULT PO Take by mouth daily.   omeprazole 40 MG capsule Commonly known as: PRILOSEC Take 1 capsule (40 mg total) by mouth daily.   PARoxetine 40 MG tablet Commonly known as: PAXIL Take 1 tablet (40 mg total) by mouth daily.   PROBIOTIC PO Take by mouth daily.   sucralfate 1 g tablet Commonly known as: Carafate 1 tab qAC and qhs AS NEEDED for upper abdominal discomfort       All past medical history, surgical history, allergies, family history, immunizations andmedications were updated in the EMR today and reviewed under the history and medication portions of their EMR.     ROS: Negative, with the exception of above mentioned in HPI   Objective:  BP 129/81 (BP Location: Left Arm, Cuff Size: Large)   Pulse 66   Temp 97.8 F (36.6 C) (Oral)   Resp 12   Ht 5\' 4"  (1.626 m)   Wt 142 lb 9.6 oz (64.7 kg)   LMP 10/20/2011   SpO2 96%   BMI 24.48 kg/m  Body mass index is 24.48 kg/m. Gen: Afebrile. No acute distress. Nontoxic in appearance, well developed, well nourished.  HENT: AT. [place]. MMM Eyes:Pupils Equal Round Reactive to light, Extraocular movements intact,  Conjunctiva without redness, discharge or icterus. Abd: Soft.  NTND. BS present MSK: No CVA tenderness Neuro: Normal gait. PERLA. EOMi. Alert. Oriented x3  Psych: Normal affect, dress and demeanor. Normal speech. Normal thought content and judgment.  No exam data present No results found. No results found for this or any previous visit (from the past 24 hour(s)).  Assessment/Plan: Gabrielle [name] is a 59 y.o. female present for OV for  Dysuria/abnormal urine Patient has been taking AZO therefore point-of-care lab will not be accurate. Elected to start Keflex 3 times daily.  Patient reports she has taken this in the past and done well. Sent urine for micro analysis and reflex culture. Patient was encouraged to hydrate with water. - Urinalysis w microscopic + reflex cultur She  will be called with results of her urinalysis and further guidance on treatment plan.    Reviewed expectations re: course of current medical issues.  Discussed self-management of symptoms.  Outlined signs and symptoms indicating need for more acute intervention.  Patient verbalized understanding and all questions were answered.  Patient received an After-Visit Summary.    Orders Placed This Encounter  Procedures  . Urinalysis w microscopic + reflex cultur   No orders of the defined types were placed in this encounter.  Referral Orders  No referral(s) requested today     Note is dictated utilizing voice recognition software. Although note has been proof read  prior to signing, occasional typographical errors still can be missed. If any questions arise, please do not hesitate to call for verification.   electronically signed by:  Howard Pouch, DO  Reader

## 2019-12-25 NOTE — Patient Instructions (Addendum)
Hydrate.  Drink plenty of water  Start keflex every 8 hours.     Urinary Tract Infection, Adult A urinary tract infection (UTI) is an infection of any part of the urinary tract. The urinary tract includes:  The kidneys.  The ureters.  The bladder.  The urethra. These organs make, store, and get rid of pee (urine) in the body. What are the causes? This is caused by germs (bacteria) in your genital area. These germs grow and cause swelling (inflammation) of your urinary tract. What increases the risk? You are more likely to develop this condition if:  You have a small, thin tube (catheter) to drain pee.  You cannot control when you pee or poop (incontinence).  You are female, and: ? You use these methods to prevent pregnancy:  A medicine that kills sperm (spermicide).  A device that blocks sperm (diaphragm). ? You have low levels of a female hormone (estrogen). ? You are pregnant.  You have genes that add to your risk.  You are sexually active.  You take antibiotic medicines.  You have trouble peeing because of: ? A prostate that is bigger than normal, if you are female. ? A blockage in the part of your body that drains pee from the bladder (urethra). ? A kidney stone. ? A nerve condition that affects your bladder (neurogenic bladder). ? Not getting enough to drink. ? Not peeing often enough.  You have other conditions, such as: ? Diabetes. ? A weak disease-fighting system (immune system). ? Sickle cell disease. ? Gout. ? Injury of the spine. What are the signs or symptoms? Symptoms of this condition include:  Needing to pee right away (urgently).  Peeing often.  Peeing small amounts often.  Pain or burning when peeing.  Blood in the pee.  Pee that smells bad or not like normal.  Trouble peeing.  Pee that is cloudy.  Fluid coming from the vagina, if you are female.  Pain in the belly or lower back. Other symptoms include:  Throwing up  (vomiting).  No urge to eat.  Feeling mixed up (confused).  Being tired and grouchy (irritable).  A fever.  Watery poop (diarrhea). How is this treated? This condition may be treated with:  Antibiotic medicine.  Other medicines.  Drinking enough water. Follow these instructions at home:  Medicines  Take over-the-counter and prescription medicines only as told by your doctor.  If you were prescribed an antibiotic medicine, take it as told by your doctor. Do not stop taking it even if you start to feel better. General instructions  Make sure you: ? Pee until your bladder is empty. ? Do not hold pee for a long time. ? Empty your bladder after sex. ? Wipe from front to back after pooping if you are a female. Use each tissue one time when you wipe.  Drink enough fluid to keep your pee pale yellow.  Keep all follow-up visits as told by your doctor. This is important. Contact a doctor if:  You do not get better after 1-2 days.  Your symptoms go away and then come back. Get help right away if:  You have very bad back pain.  You have very bad pain in your lower belly.  You have a fever.  You are sick to your stomach (nauseous).  You are throwing up. Summary  A urinary tract infection (UTI) is an infection of any part of the urinary tract.  This condition is caused by germs in your genital  area.  There are many risk factors for a UTI. These include having a small, thin tube to drain pee and not being able to control when you pee or poop.  Treatment includes antibiotic medicines for germs.  Drink enough fluid to keep your pee pale yellow. This information is not intended to replace advice given to you by your health care provider. Make sure you discuss any questions you have with your health care provider. Document Revised: 03/30/2018 Document Reviewed: 10/20/2017 Elsevier Patient Education  2020 [employer].

## 2019-12-27 LAB — URINE CULTURE
MICRO NUMBER:: 10897871
SPECIMEN QUALITY:: ADEQUATE

## 2019-12-27 LAB — URINALYSIS W MICROSCOPIC + REFLEX CULTURE
Bilirubin Urine: NEGATIVE
Glucose, UA: NEGATIVE
Hgb urine dipstick: NEGATIVE
Ketones, ur: NEGATIVE
Nitrites, Initial: POSITIVE — AB
Specific Gravity, Urine: 1.034 (ref 1.001–1.03)
Squamous Epithelial / HPF: >=60 /HPF — AB (ref <=5)
pH: 5.5 (ref 5.0–8.0)

## 2019-12-27 LAB — CULTURE INDICATED

## 2019-12-28 NOTE — Progress Notes (Signed)
Left VM for pt to CB for lab results

## 2019-12-28 NOTE — Progress Notes (Signed)
Notified pt of lab results 

## 2020-01-09 ENCOUNTER — Ambulatory Visit: Payer: Medicare HMO | Admitting: Family Medicine

## 2020-01-16 ENCOUNTER — Ambulatory Visit: Payer: Medicare HMO | Admitting: Family Medicine

## 2020-01-16 DIAGNOSIS — Z0289 Encounter for other administrative examinations: Secondary | ICD-10-CM

## 2020-01-30 ENCOUNTER — Other Ambulatory Visit: Payer: Self-pay

## 2020-01-30 ENCOUNTER — Encounter: Payer: Self-pay | Admitting: Family Medicine

## 2020-01-30 ENCOUNTER — Ambulatory Visit (INDEPENDENT_AMBULATORY_CARE_PROVIDER_SITE_OTHER): Payer: Medicare HMO | Admitting: Family Medicine

## 2020-01-30 VITALS — BP 127/76 | HR 70 | Temp 97.7°F | Resp 16 | Ht 64.0 in | Wt 139.8 lb

## 2020-01-30 DIAGNOSIS — E7800 Pure hypercholesterolemia, unspecified: Secondary | ICD-10-CM

## 2020-01-30 DIAGNOSIS — I2510 Atherosclerotic heart disease of native coronary artery without angina pectoris: Secondary | ICD-10-CM | POA: Diagnosis not present

## 2020-01-30 DIAGNOSIS — K5792 Diverticulitis of intestine, part unspecified, without perforation or abscess without bleeding: Secondary | ICD-10-CM

## 2020-01-30 DIAGNOSIS — R1031 Right lower quadrant pain: Secondary | ICD-10-CM

## 2020-01-30 LAB — LIPID PANEL
Cholesterol: 157 mg/dL (ref 0–200)
HDL: 54.2 mg/dL (ref >39.00)
LDL Cholesterol: 85 mg/dL (ref 0–99)
NonHDL: 102.69
Total CHOL/HDL Ratio: 3
Triglycerides: 90 mg/dL (ref 0.0–149.0)
VLDL: 18 mg/dL (ref 0.0–40.0)

## 2020-01-30 LAB — COMPREHENSIVE METABOLIC PANEL
ALT: 34 U/L (ref 0–35)
AST: 32 U/L (ref 0–37)
Albumin: 4.6 g/dL (ref 3.5–5.2)
Alkaline Phosphatase: 109 U/L (ref 39–117)
BUN: 17 mg/dL (ref 6–23)
CO2: 32 mEq/L (ref 19–32)
Calcium: 9.6 mg/dL (ref 8.4–10.5)
Chloride: 101 mEq/L (ref 96–112)
Creatinine, Ser: 0.89 mg/dL (ref 0.40–1.20)
GFR: 70.69 mL/min (ref >60.00)
Glucose, Bld: 87 mg/dL (ref 70–99)
Potassium: 4.5 mEq/L (ref 3.5–5.1)
Sodium: 138 mEq/L (ref 135–145)
Total Bilirubin: 0.7 mg/dL (ref 0.2–1.2)
Total Protein: 6.4 g/dL (ref 6.0–8.3)

## 2020-01-30 LAB — CBC WITH DIFFERENTIAL/PLATELET
Basophils Absolute: 0 10*3/uL (ref 0.0–0.1)
Basophils Relative: 0.4 % (ref 0.0–3.0)
Eosinophils Absolute: 0.1 10*3/uL (ref 0.0–0.7)
Eosinophils Relative: 1.4 % (ref 0.0–5.0)
HCT: 41.4 % (ref 36.0–46.0)
Hemoglobin: 13.6 g/dL (ref 12.0–15.0)
Lymphocytes Relative: 33.9 % (ref 12.0–46.0)
Lymphs Abs: 1.8 10*3/uL (ref 0.7–4.0)
MCHC: 32.8 g/dL (ref 30.0–36.0)
MCV: 100.9 fl — ABNORMAL HIGH (ref 78.0–100.0)
Monocytes Absolute: 0.4 10*3/uL (ref 0.1–1.0)
Monocytes Relative: 7.4 % (ref 3.0–12.0)
Neutro Abs: 3.1 10*3/uL (ref 1.4–7.7)
Neutrophils Relative %: 56.9 % (ref 43.0–77.0)
Platelets: 199 10*3/uL (ref 150.0–400.0)
RBC: 4.11 Mil/uL (ref 3.87–5.11)
RDW: 12.7 % (ref 11.5–15.5)
WBC: 5.4 10*3/uL (ref 4.0–10.5)

## 2020-01-30 MED ORDER — METRONIDAZOLE 500 MG PO TABS: ORAL_TABLET | ORAL | 0 refills | Status: DC

## 2020-01-30 MED ORDER — SUCRALFATE 1 G PO TABS
ORAL_TABLET | ORAL | 0 refills | Status: DC
Start: 2020-01-30 — End: 2020-02-19

## 2020-01-30 MED ORDER — CIPROFLOXACIN HCL 500 MG PO TABS: 500.0000 mg | ORAL_TABLET | Freq: Two times a day (BID) | ORAL | 0 refills | Status: AC

## 2020-01-30 NOTE — Progress Notes (Signed)
OFFICE VISIT  01/30/2020  CC:  Chief Complaint  Patient presents with  . Abdominal Pain    right side, occurring for 4 days that started off very intense as well as nauea   HPI:    Patient is a 59 y.o. Caucasian female who presents for abdominal pain. Onset 5d/a acutely-- pain in right side of abdomen pain about the level of umbilicus. Felt full/sore diffusely in all of abd that day.  Laxative led to her having a BM but this did not help anything.  Has chronic lower abd tightness due to a remote past "botched" surgery per her report, says this was worse that day.  Some nausea w/out vomiting.  Says her BMs look normal--hard and infrequent, her usual constipation.  No new meds lately. The pain has been constant up until today but is much better today--nearly gone. Appetite down some.  Nothing seems to trigger or alleviate the pain. No fevers/chills. No urinary complaints.  Hx of MI and stent to proximal LAD in 2018. Normal echo per pt report 2020.  ROS: no fevers, no CP, no SOB, no wheezing, no cough, no dizziness, no HAs, no rashes, no melena/hematochezia.  No polyuria or polydipsia.  No myalgias or arthralgias.  No focal weakness, paresthesias, or tremors.  No acute vision or hearing abnormalities.  No palpitations.    PMP AWARE reviewed today: most recent rx for alprazolam was filled 10/05/19, # 69, rx by me. No red flags.   Past Medical History:  Diagnosis Date  . Anxiety and depression   . Colon cancer screening 04/2019   Cologuard NEG.  Repeat 04/2022  . Dyspepsia   . GERD (gastroesophageal reflux disease)   . History of myocardial infarction 2018  . 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  Marland Kitchen Hx of migraines    remote past  . Hypercholesterolemia   . NHL (non-Hodgkin's lymphoma) ([place]) 2016   left peritoneal mesonteric mass  . Palpitations 2015   no monitoring was done-->resolved spontaneously    Past Surgical History:   Procedure Laterality Date  . biopsy  2016   Initial bx needle; then lab abd (done in [facility]) done to get further bx, exp lap--complications--sepsis/hemorrhage--memory/cognitive difficulties since that surgery/hospitalization--details somewhat cloudy from pt's info.  Marland Kitchen BREAST ENHANCEMENT SURGERY  2003  . Heart Stent  2018   Prox LAD (murrells inlet).  Fatima Blank HERNIA REPAIR  2017   w/mesh  . TONSILLECTOMY    . TRANSTHORACIC ECHOCARDIOGRAM  09/2013   2015 ALL NORMAL.  2020 normal per pt report (Drr. Applegate with [facility] [place]  . WISDOM TOOTH EXTRACTION      Outpatient Medications Prior to Visit  Medication Sig Dispense Refill  . ALPRAZolam (XANAX) 0.5 MG tablet Take 1 tablet (0.5 mg total) by mouth 3 (three) times daily. 270 tablet 1  . aspirin EC 81 MG tablet Take by mouth.    Marland Kitchen atorvastatin (LIPITOR) 80 MG tablet Take 1 tablet (80 mg total) by mouth daily. 90 tablet 3  . lamoTRIgine (LAMICTAL) 200 MG tablet Take 1 tablet (200 mg total) by mouth daily. 90 tablet 3  . Metoprolol Succinate 25 MG CS24 Take 12.5 mg by mouth daily. 45 capsule 3  . Multiple Vitamins-Minerals (MULTIVITAMIN ADULT PO) Take by mouth daily.    Marland Kitchen omeprazole (PRILOSEC) 40 MG capsule Take 1 capsule (40 mg total) by mouth daily. 90 capsule 3  . PARoxetine (PAXIL) 40 MG tablet Take 1 tablet (40  mg total) by mouth daily. 90 tablet 3  . sucralfate (CARAFATE) 1 g tablet 1 tab qAC and qhs AS NEEDED for upper abdominal discomfort 120 tablet 1  . cephALEXin (KEFLEX) 500 MG capsule Take 1 capsule (500 mg total) by mouth 3 (three) times daily. (Patient not taking: Reported on 01/30/2020) 21 capsule 0  . Probiotic Product (PROBIOTIC PO) Take by mouth daily. (Patient not taking: Reported on 01/30/2020)     No facility-administered medications prior to visit.    Allergies  Allergen Reactions  . Sulfa Antibiotics Rash    ROS As per HPI  PE: Vitals with BMI 01/30/2020 12/25/2019 10/03/2019  Height 5\' 4"  5\' 4"  5\' 4"    Weight 139 lbs 13 oz 142 lbs 10 oz 140 lbs 10 oz  BMI 23.98 81.44 81.85  Systolic 631 497 026  Diastolic 76 81 83  Pulse 70 66 64     Exam chaperoned by Shepard General, CMA. Gen: Alert, well appearing.  Patient is oriented to person, place, time, and situation. AFFECT: pleasant, lucid thought and speech. VZC:HYIF: no injection, icteris, swelling, or exudate.  EOMI, PERRLA. Mouth: lips without lesion/swelling.  Oral mucosa pink and moist. Oropharynx without erythema, exudate, or swelling.  CV: RRR, no m/r/g.   LUNGS: CTA bilat, nonlabored resps, good aeration in all lung fields. ABD: soft, ND.  She has mild/mod TTP in RLQ.  This also brings on some referred pain into R mid and R upper abdomen that is less intense.  No guarding or rebound. BS normal.  No hepatospenomegaly or mass.  No bruits. EXT: no clubbing or cyanosis.  no edema.  No rash or jaundice  LABS:    Chemistry      Component Value Date/Time   NA 140 05/28/2014 1427   K 4.7 05/28/2014 1427   CL 103 05/28/2014 1427   CO2 32 05/28/2014 1427   BUN 17 05/28/2014 1427   CREATININE 0.76 05/28/2014 1427      Component Value Date/Time   CALCIUM 10.2 05/28/2014 1427   ALKPHOS 101 05/28/2014 1427   AST 21 05/28/2014 1427   ALT 17 05/28/2014 1427   BILITOT 0.4 05/28/2014 1427     Lab Results  Component Value Date   WBC 6.0 06/04/2014   HGB 15.0 06/04/2014   HCT 44.7 06/04/2014   MCV 97.0 06/04/2014   PLT 227 06/04/2014   Lab Results  Component Value Date   CHOL 175 05/28/2014   HDL 56.40 05/28/2014   LDLCALC 89 05/28/2014   TRIG 146.0 05/28/2014   CHOLHDL 3 05/28/2014    IMPRESSION AND PLAN:  Acute RLQ abd pain.  NOT acute abdomen. Suspect acute diverticulitis, uncomplicated. Check CBC today. Start flagyl 500 mg tid x 14d and cipro 500 mg bid x 14d. Therapeutic expectations and side effect profile of medication discussed today.  Patient's questions answered.  CAD, hyperlipidemia: we did not get labs  last visit for f/u of these problems 4 mo ago b/c she stated she got them at her GYN MD visit just prior.  However, her GYN record had no labs.  She is ok with getting cmet and flp today (she has had only 2 tsp of applesauce today to help take her meds).  An After Visit Summary was printed and given to the patient.  FOLLOW UP: Return for 10-14 days f/u abd pain PLUS f/u HLD/CAD.  Signed:  Crissie Sickles, MD           01/30/2020

## 2020-01-31 ENCOUNTER — Telehealth: Payer: Self-pay

## 2020-01-31 NOTE — Telephone Encounter (Signed)
Patient is checking on Rx for sucralfate (CARAFATE) 1 g tablet that was supposed to go [facility]. They don't have it.

## 2020-01-31 NOTE — Telephone Encounter (Signed)
Pharmacy will fill for pt to pay OOP

## 2020-02-13 ENCOUNTER — Ambulatory Visit: Payer: Medicare HMO | Admitting: Family Medicine

## 2020-02-17 ENCOUNTER — Other Ambulatory Visit: Payer: Self-pay | Admitting: Family Medicine

## 2020-02-25 DIAGNOSIS — Z0100 Encounter for examination of eyes and vision without abnormal findings: Secondary | ICD-10-CM | POA: Diagnosis not present

## 2020-02-27 ENCOUNTER — Other Ambulatory Visit: Payer: Self-pay

## 2020-02-27 ENCOUNTER — Encounter: Payer: Self-pay | Admitting: Family Medicine

## 2020-02-27 ENCOUNTER — Ambulatory Visit (INDEPENDENT_AMBULATORY_CARE_PROVIDER_SITE_OTHER): Payer: Medicare HMO | Admitting: Family Medicine

## 2020-02-27 VITALS — BP 113/73 | HR 73 | Temp 98.0°F | Resp 16 | Ht 64.0 in | Wt 143.4 lb

## 2020-02-27 DIAGNOSIS — I2510 Atherosclerotic heart disease of native coronary artery without angina pectoris: Secondary | ICD-10-CM

## 2020-02-27 DIAGNOSIS — K5792 Diverticulitis of intestine, part unspecified, without perforation or abscess without bleeding: Secondary | ICD-10-CM | POA: Diagnosis not present

## 2020-02-27 DIAGNOSIS — E7800 Pure hypercholesterolemia, unspecified: Secondary | ICD-10-CM | POA: Diagnosis not present

## 2020-02-27 DIAGNOSIS — F5101 Primary insomnia: Secondary | ICD-10-CM

## 2020-02-27 MED ORDER — EZETIMIBE 10 MG PO TABS: 10.0000 mg | ORAL_TABLET | Freq: Every day | ORAL | 1 refills | Status: DC

## 2020-02-27 MED ORDER — ZOLPIDEM TARTRATE 10 MG PO TABS: 10.0000 mg | ORAL_TABLET | Freq: Every evening | ORAL | 1 refills | Status: DC | PRN

## 2020-02-27 NOTE — Progress Notes (Signed)
OFFICE VISIT  02/27/2020  CC:  Chief Complaint  Patient presents with  . Follow-up    abdominal pain, HLD, CAD    HPI:    Patient is a 59 y.o. female who presents for 1 mo f/u abd pain. A/P as of last visit: "Acute RLQ abd pain.  NOT acute abdomen. Suspect acute diverticulitis, uncomplicated. Check CBC today. Start flagyl 500 mg tid x 14d and cipro 500 mg bid x 14d. Therapeutic expectations and side effect profile of medication discussed today.  Patient's questions answered.  CAD, hyperlipidemia: we did not get labs last visit for f/u of these problems 4 mo ago b/c she stated she got them at her GYN MD visit just prior.  However, her GYN record had no labs.  She is ok with getting cmet and flp today (she has had only 2 tsp of applesauce today to help take her meds)."  INTERIM HX: All pain subsided after getting on abx, resolved completely.  No probs taking the abx at all.  Appetite is good: "a little too good".  Labs good last visit but LDL was 85, not ideal/at goal for her risk level (has CAD). She is on max dose atorva.  She has chronic insomnia, has used ambien 10mg  hs prn, avg q2-3 nights in the past and it was helpful.  Asks to restart this, needs new rx.  PMP AWARE reviewed today: most recent rx for alpraz was filled 10/05/19, # 29, rx by me. Most recent ambien rx filled 01/23/19 by [place] Gander[place] ([place] of [place] No red flags.  Past Medical History:  Diagnosis Date  . Anxiety and depression   . CAD (coronary artery disease)    MI + stent 2018  . Colon cancer screening 04/2019   Cologuard NEG.  Repeat 04/2022  . Dyspepsia   . GERD (gastroesophageal reflux disease)   . History of myocardial infarction 2018  . 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  Marland Kitchen Hx of migraines    remote past  . Hypercholesterolemia   . NHL (non-Hodgkin's lymphoma) ([place]) 2016   left peritoneal mesonteric mass  . Palpitations 2015   no  monitoring was done-->resolved spontaneously    Past Surgical History:  Procedure Laterality Date  . biopsy  2016   Initial bx needle; then lab abd (done in [facility]) done to get further bx, exp lap--complications--sepsis/hemorrhage--memory/cognitive difficulties since that surgery/hospitalization--details somewhat cloudy from pt's info.  Marland Kitchen BREAST ENHANCEMENT SURGERY  2003  . Heart Stent  2018   Prox LAD (murrells inlet).  Fatima Blank HERNIA REPAIR  2017   w/mesh  . TONSILLECTOMY    . TRANSTHORACIC ECHOCARDIOGRAM  09/2013   2015 ALL NORMAL.  2020 normal per pt report (Drr. Applegate with [facility] [place]  . WISDOM TOOTH EXTRACTION      Outpatient Medications Prior to Visit  Medication Sig Dispense Refill  . ALPRAZolam (XANAX) 0.5 MG tablet Take 1 tablet (0.5 mg total) by mouth 3 (three) times daily. 270 tablet 1  . aspirin EC 81 MG tablet Take by mouth.    Marland Kitchen atorvastatin (LIPITOR) 80 MG tablet Take 1 tablet (80 mg total) by mouth daily. 90 tablet 3  . lamoTRIgine (LAMICTAL) 200 MG tablet Take 1 tablet (200 mg total) by mouth daily. 90 tablet 3  . Metoprolol Succinate 25 MG CS24 Take 12.5 mg by mouth daily. 45 capsule 3  . Multiple Vitamins-Minerals (MULTIVITAMIN ADULT PO) Take by mouth daily.    Marland Kitchen  omeprazole (PRILOSEC) 40 MG capsule Take 1 capsule (40 mg total) by mouth daily. 90 capsule 3  . PARoxetine (PAXIL) 40 MG tablet Take 1 tablet (40 mg total) by mouth daily. 90 tablet 3  . sucralfate (CARAFATE) 1 g tablet TAKE 1 TABLET BEFORE EACH MEAL AND AT BEDTIME AS NEEDED FOR UPPER ABDOMINAL DISCOMFORT 120 tablet 1  . metroNIDAZOLE (FLAGYL) 500 MG tablet 1 tab po tid x 14 days (Patient not taking: Reported on 02/27/2020) 42 tablet 0   No facility-administered medications prior to visit.    Allergies  Allergen Reactions  . Sulfa Antibiotics Rash    ROS As per HPI  PE: Vitals with BMI 02/27/2020 01/30/2020 12/25/2019  Height 5\' 4"  5\' 4"  5\' 4"   Weight 143 lbs 6 oz 139 lbs 13 oz 142 lbs 10  oz  BMI 24.6 80.03 49.17  Systolic 915 056 979  Diastolic 73 76 81  Pulse 73 70 66     Gen: Alert, well appearing.  Patient is oriented to person, place, time, and situation. AFFECT: pleasant, lucid thought and speech. No further exam today.  LABS:    Chemistry      Component Value Date/Time   NA 138 01/30/2020 1429   K 4.5 01/30/2020 1429   CL 101 01/30/2020 1429   CO2 32 01/30/2020 1429   BUN 17 01/30/2020 1429   CREATININE 0.89 01/30/2020 1429      Component Value Date/Time   CALCIUM 9.6 01/30/2020 1429   ALKPHOS 109 01/30/2020 1429   AST 32 01/30/2020 1429   ALT 34 01/30/2020 1429   BILITOT 0.7 01/30/2020 1429     Lab Results  Component Value Date   CHOL 157 01/30/2020   HDL 54.20 01/30/2020   LDLCALC 85 01/30/2020   TRIG 90.0 01/30/2020   CHOLHDL 3 01/30/2020   Lab Results  Component Value Date   WBC 5.4 01/30/2020   HGB 13.6 01/30/2020   HCT 41.4 01/30/2020   MCV 100.9 (H) 01/30/2020   PLT 199.0 01/30/2020    IMPRESSION AND PLAN:  1) Acute diverticulitis: resolved approp with antibiotics.  2) Hypercholesterolemia in pt with CAD/hx of MI. LDL 85 1 mo ago. Add zetia 10mg  qd to atorva 80 she is already on. Therapeutic expectations and side effect profile of medication discussed today.  Patient's questions answered. Return in 3 mo for FLP and hepatic panel.  3) Insomnia: in setting bipolar d/o and chronic anxiety. Past success with prn use of ambien, so we'll restart this today: 10mg  qhs prn, #180, RF x 1. Controlled substance contract reviewed with patient today.  Patient signed this and it will be placed in the chart.    An After Visit Summary was printed and given to the patient.  FOLLOW UP: Return for 6 mo RCI, but needs 3 mo fasting lab appt.  Signed:  Crissie Sickles, MD           02/27/2020

## 2020-02-29 DIAGNOSIS — Z0100 Encounter for examination of eyes and vision without abnormal findings: Secondary | ICD-10-CM | POA: Diagnosis not present

## 2020-03-10 DIAGNOSIS — E7800 Pure hypercholesterolemia, unspecified: Secondary | ICD-10-CM | POA: Diagnosis not present

## 2020-03-10 DIAGNOSIS — Z79899 Other long term (current) drug therapy: Secondary | ICD-10-CM | POA: Diagnosis not present

## 2020-03-10 DIAGNOSIS — I2510 Atherosclerotic heart disease of native coronary artery without angina pectoris: Secondary | ICD-10-CM | POA: Diagnosis not present

## 2020-03-14 ENCOUNTER — Telehealth: Payer: Self-pay

## 2020-03-14 NOTE — Telephone Encounter (Signed)
Patient calling disputing no show fee on 9/22. She said she called to reschedule to appt.  I told patient what notes said in the appt notes of the reason she cancelled the appt. She disagreed with that also. And said she never said that to lady on phone.   Copied from appt notes: prescreened and confirmed/f/u bladder infection , called to cancel at 11:30A, better, no longer needs appt/dnh  Thank you, Please call Asaiah at [phone].

## 2020-03-17 NOTE — Telephone Encounter (Signed)
Per Estill Bamberg okay to remove no show fee $50.00.  Patient made aware of.

## 2020-04-01 NOTE — Telephone Encounter (Signed)
I sent an email to [employer] asking them to remove the no show fee for DOS 01/16/2020.

## 2020-04-04 ENCOUNTER — Encounter: Payer: Medicare HMO | Admitting: Family Medicine

## 2020-04-17 ENCOUNTER — Other Ambulatory Visit: Payer: Self-pay | Admitting: Family Medicine

## 2020-05-29 ENCOUNTER — Ambulatory Visit (INDEPENDENT_AMBULATORY_CARE_PROVIDER_SITE_OTHER): Payer: Medicare HMO

## 2020-05-29 ENCOUNTER — Other Ambulatory Visit: Payer: Self-pay

## 2020-05-29 DIAGNOSIS — E7800 Pure hypercholesterolemia, unspecified: Secondary | ICD-10-CM

## 2020-05-29 DIAGNOSIS — I2510 Atherosclerotic heart disease of native coronary artery without angina pectoris: Secondary | ICD-10-CM | POA: Diagnosis not present

## 2020-05-29 LAB — HEPATIC FUNCTION PANEL
ALT: 40 U/L — ABNORMAL HIGH (ref 0–35)
AST: 36 U/L (ref 0–37)
Albumin: 4.2 g/dL (ref 3.5–5.2)
Alkaline Phosphatase: 124 U/L — ABNORMAL HIGH (ref 39–117)
Bilirubin, Direct: 0.1 mg/dL (ref 0.0–0.3)
Total Bilirubin: 0.6 mg/dL (ref 0.2–1.2)
Total Protein: 6 g/dL (ref 6.0–8.3)

## 2020-05-29 LAB — LIPID PANEL
Cholesterol: 127 mg/dL (ref 0–200)
HDL: 50.7 mg/dL (ref >39.00)
LDL Cholesterol: 63 mg/dL (ref 0–99)
NonHDL: 76.74
Total CHOL/HDL Ratio: 3
Triglycerides: 69 mg/dL (ref 0.0–149.0)
VLDL: 13.8 mg/dL (ref 0.0–40.0)

## 2020-07-04 ENCOUNTER — Other Ambulatory Visit: Payer: Self-pay

## 2020-07-04 MED ORDER — ALPRAZOLAM 0.5 MG PO TABS: 0.5000 mg | ORAL_TABLET | Freq: Three times a day (TID) | ORAL | 1 refills | Status: DC

## 2020-07-04 NOTE — Telephone Encounter (Signed)
Requesting: alprazolam Contract: 04/02/19 UDS: n/a Last Visit: 02/27/20 Next Visit: 07/09/20, acute and 08/28/20 RCI Last Refill: 10/03/19(270,1)  Please Advise. Medication pending

## 2020-07-09 ENCOUNTER — Encounter: Payer: Self-pay | Admitting: Family Medicine

## 2020-07-09 ENCOUNTER — Ambulatory Visit (INDEPENDENT_AMBULATORY_CARE_PROVIDER_SITE_OTHER): Payer: Medicare HMO | Admitting: Family Medicine

## 2020-07-09 ENCOUNTER — Other Ambulatory Visit: Payer: Self-pay

## 2020-07-09 VITALS — BP 114/74 | HR 70 | Temp 97.7°F | Resp 16 | Ht 64.0 in | Wt 141.6 lb

## 2020-07-09 DIAGNOSIS — M79605 Pain in left leg: Secondary | ICD-10-CM

## 2020-07-09 NOTE — Progress Notes (Signed)
OFFICE VISIT  07/09/2020  CC:  Chief Complaint  Patient presents with  . Left leg pain    X 2 months, has tried taking aleve and it does not help. Believes it may be nerve related   HPI:    Patient is a 60 y.o. Caucasian female who presents for left leg pain. Onset about 6 wks ago, intermittent pain from mid L thigh in medial aspect extending down across top of thigh over L knee and down lat aspect of L LL to ankle.  Occurs at rest or with ambulation/activity.  Duration varies from minutes to hours, character of pain is sharp, intensity is mild or mod---not severe.  Heating pad helps.  NSAID tried, no help.  No exacerbating or alleviating factors (other than heat alleviating it some). No tingling or numbness.  No leg weakness.  Occurrence seems "random".  No skin changes/discoloration/rashes.   Past Medical History:  Diagnosis Date  . Anxiety and depression   . CAD (coronary artery disease)    MI + stent 2018  . Colon cancer screening 04/2019   Cologuard NEG.  Repeat 04/2022  . Dyspepsia   . GERD (gastroesophageal reflux disease)   . History of myocardial infarction 2018  . 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  Marland Kitchen Hx of migraines    remote past  . Hypercholesterolemia   . NHL (non-Hodgkin's lymphoma) ([place]) 2016   left peritoneal mesonteric mass  . Palpitations 2015   no monitoring was done-->resolved spontaneously    Past Surgical History:  Procedure Laterality Date  . biopsy  2016   Initial bx needle; then lab abd (done in [facility]) done to get further bx, exp lap--complications--sepsis/hemorrhage--memory/cognitive difficulties since that surgery/hospitalization--details somewhat cloudy from pt's info.  Marland Kitchen BREAST ENHANCEMENT SURGERY  2003  . Heart Stent  2018   Prox LAD (murrells inlet).  Fatima Blank HERNIA REPAIR  2017   w/mesh  . TONSILLECTOMY    . TRANSTHORACIC ECHOCARDIOGRAM  09/2013   2015 ALL NORMAL.  2020 normal per pt  report (Drr. Applegate with [facility] [place]  . WISDOM TOOTH EXTRACTION      Outpatient Medications Prior to Visit  Medication Sig Dispense Refill  . ALPRAZolam (XANAX) 0.5 MG tablet Take 1 tablet (0.5 mg total) by mouth 3 (three) times daily. 270 tablet 1  . aspirin EC 81 MG tablet Take by mouth.    Marland Kitchen atorvastatin (LIPITOR) 80 MG tablet Take 1 tablet (80 mg total) by mouth daily. 90 tablet 3  . ezetimibe (ZETIA) 10 MG tablet Take 1 tablet (10 mg total) by mouth daily. 90 tablet 1  . lamoTRIgine (LAMICTAL) 200 MG tablet Take 1 tablet (200 mg total) by mouth daily. 90 tablet 3  . Metoprolol Succinate 25 MG CS24 Take 12.5 mg by mouth daily. 45 capsule 3  . Multiple Vitamins-Minerals (MULTIVITAMIN ADULT PO) Take by mouth daily.    Marland Kitchen omeprazole (PRILOSEC) 40 MG capsule Take 1 capsule (40 mg total) by mouth daily. 90 capsule 3  . PARoxetine (PAXIL) 40 MG tablet Take 1 tablet (40 mg total) by mouth daily. 90 tablet 3  . zolpidem (AMBIEN) 10 MG tablet Take 1 tablet (10 mg total) by mouth at bedtime as needed for sleep. 90 tablet 1  . sucralfate (CARAFATE) 1 g tablet TAKE 1 TABLET BEFORE EACH MEAL AND AT BEDTIME AS NEEDED FOR UPPER ABDOMINAL DISCOMFORT (Patient not taking: Reported on 07/09/2020) 120 tablet 1   No facility-administered medications  prior to visit.    Allergies  Allergen Reactions  . Sulfa Antibiotics Rash    ROS As per HPI  PE: Vitals with BMI 07/09/2020 02/27/2020 01/30/2020  Height 5\' 4"  5\' 4"  5\' 4"   Weight 141 lbs 10 oz 143 lbs 6 oz 139 lbs 13 oz  BMI 24.29 76.7 20.94  Systolic 709 628 366  Diastolic 74 73 76  Pulse 70 73 70   Gen: Alert, well appearing.  Patient is oriented to person, place, time, and situation. AFFECT: pleasant, lucid thought and speech. Legs w/out tenderness or asymmetry.  Skin normal. LE strength 5/5 prox/dist bilat DP and PT pulses 2+ bilat.  Patellar and achilles DTRs brisk/symmetric bilat.  LABS:    Chemistry      Component Value Date/Time    NA 138 01/30/2020 1429   K 4.5 01/30/2020 1429   CL 101 01/30/2020 1429   CO2 32 01/30/2020 1429   BUN 17 01/30/2020 1429   CREATININE 0.89 01/30/2020 1429      Component Value Date/Time   CALCIUM 9.6 01/30/2020 1429   ALKPHOS 124 (H) 05/29/2020 1101   AST 36 05/29/2020 1101   ALT 40 (H) 05/29/2020 1101   BILITOT 0.6 05/29/2020 1101     Lab Results  Component Value Date   WBC 5.4 01/30/2020   HGB 13.6 01/30/2020   HCT 41.4 01/30/2020   MCV 100.9 (H) 01/30/2020   PLT 199.0 01/30/2020    IMPRESSION AND PLAN:  Atypical L leg pain, unknown etiology.  Normal exam. Question neuropathic etiology, but character of pain diff, plus it does not clearly fit a dermatomal distribution or area of one peripheral nerve. Watchful waiting approach.   An After Visit Summary was printed and given to the patient.  FOLLOW UP: No follow-ups on file.  Signed:  Crissie Sickles, MD           07/09/2020

## 2020-07-12 ENCOUNTER — Other Ambulatory Visit: Payer: Self-pay | Admitting: Family Medicine

## 2020-07-17 ENCOUNTER — Other Ambulatory Visit: Payer: Self-pay

## 2020-07-17 MED ORDER — PAROXETINE HCL 40 MG PO TABS: 40.0000 mg | ORAL_TABLET | Freq: Every day | ORAL | 1 refills | Status: DC

## 2020-07-19 ENCOUNTER — Other Ambulatory Visit: Payer: Self-pay | Admitting: Family Medicine

## 2020-08-06 ENCOUNTER — Ambulatory Visit: Payer: Medicare HMO

## 2020-08-06 ENCOUNTER — Other Ambulatory Visit: Payer: Self-pay | Admitting: Family Medicine

## 2020-08-24 DIAGNOSIS — H40033 Anatomical narrow angle, bilateral: Secondary | ICD-10-CM | POA: Diagnosis not present

## 2020-08-24 DIAGNOSIS — H2513 Age-related nuclear cataract, bilateral: Secondary | ICD-10-CM | POA: Diagnosis not present

## 2020-08-25 DIAGNOSIS — L718 Other rosacea: Secondary | ICD-10-CM | POA: Diagnosis not present

## 2020-08-25 DIAGNOSIS — L731 Pseudofolliculitis barbae: Secondary | ICD-10-CM | POA: Diagnosis not present

## 2020-08-27 ENCOUNTER — Ambulatory Visit (INDEPENDENT_AMBULATORY_CARE_PROVIDER_SITE_OTHER): Payer: Medicare HMO

## 2020-08-27 VITALS — Ht 64.0 in | Wt 141.0 lb

## 2020-08-27 DIAGNOSIS — Z0000 Encounter for general adult medical examination without abnormal findings: Secondary | ICD-10-CM | POA: Diagnosis not present

## 2020-08-27 NOTE — Patient Instructions (Signed)
Gabrielle [name] , Thank you for taking time to complete your Medicare Wellness Visit. I appreciate your ongoing commitment to your health goals. Please review the following plan we discussed and let me know if I can assist you in the future.   Screening recommendations/referrals: Colonoscopy: Cologuard completed 05/02/2019-Due 05/01/2022 Mammogram: Due-Declined. Please call the office to schedule if you change your mind. Bone Density: Not yet indicated. Due at age 56 Recommended yearly ophthalmology/optometry visit for glaucoma screening and checkup Recommended yearly dental visit for hygiene and checkup  Vaccinations: Influenza vaccine: Declined Pneumococcal vaccine: Not yet indicated. Due at age 60 Tdap vaccine: Discuss with pharmacy Shingles vaccine: Completed vaccines  Covid-19: Booster due  Advanced directives: Please bring a copy for your chart  Conditions/risks identified: See problem list  Next appointment: Follow up in one year for your annual wellness visit.   Preventive Care 40-64 Years, Female Preventive care refers to lifestyle choices and visits with your health care provider that can promote health and wellness. What does preventive care include?  A yearly physical exam. This is also called an annual well check.  Dental exams once or twice a year.  Routine eye exams. Ask your health care provider how often you should have your eyes checked.  Personal lifestyle choices, including:  Daily care of your teeth and gums.  Regular physical activity.  Eating a healthy diet.  Avoiding tobacco and drug use.  Limiting alcohol use.  Practicing safe sex.  Taking low-dose aspirin daily starting at age 76.  Taking vitamin and mineral supplements as recommended by your health care provider. What happens during an annual well check? The services and screenings done by your health care provider during your annual well check will depend on your age, overall health, lifestyle risk  factors, and family history of disease. Counseling  Your health care provider may ask you questions about your:  Alcohol use.  Tobacco use.  Drug use.  Emotional well-being.  Home and relationship well-being.  Sexual activity.  Eating habits.  Work and work Statistician.  Method of birth control.  Menstrual cycle.  Pregnancy history. Screening  You may have the following tests or measurements:  Height, weight, and BMI.  Blood pressure.  Lipid and cholesterol levels. These may be checked every 5 years, or more frequently if you are over 65 years old.  Skin check.  Lung cancer screening. You may have this screening every year starting at age 57 if you have a 30-pack-year history of smoking and currently smoke or have quit within the past 15 years.  Fecal occult blood test (FOBT) of the stool. You may have this test every year starting at age 58.  Flexible sigmoidoscopy or colonoscopy. You may have a sigmoidoscopy every 5 years or a colonoscopy every 10 years starting at age 68.  Hepatitis C blood test.  Hepatitis B blood test.  Sexually transmitted disease (STD) testing.  Diabetes screening. This is done by checking your blood sugar (glucose) after you have not eaten for a while (fasting). You may have this done every 1-3 years.  Mammogram. This may be done every 1-2 years. Talk to your health care provider about when you should start having regular mammograms. This may depend on whether you have a family history of breast cancer.  BRCA-related cancer screening. This may be done if you have a family history of breast, ovarian, tubal, or peritoneal cancers.  Pelvic exam and Pap test. This may be done every 3 years starting at age  21. Starting at age 68, this may be done every 5 years if you have a Pap test in combination with an HPV test.  Bone density scan. This is done to screen for osteoporosis. You may have this scan if you are at high risk for  osteoporosis. Discuss your test results, treatment options, and if necessary, the need for more tests with your health care provider. Vaccines  Your health care provider may recommend certain vaccines, such as:  Influenza vaccine. This is recommended every year.  Tetanus, diphtheria, and acellular pertussis (Tdap, Td) vaccine. You may need a Td booster every 10 years.  Zoster vaccine. You may need this after age 5.  Pneumococcal 13-valent conjugate (PCV13) vaccine. You may need this if you have certain conditions and were not previously vaccinated.  Pneumococcal polysaccharide (PPSV23) vaccine. You may need one or two doses if you smoke cigarettes or if you have certain conditions. Talk to your health care provider about which screenings and vaccines you need and how often you need them. This information is not intended to replace advice given to you by your health care provider. Make sure you discuss any questions you have with your health care provider. Document Released: 05/09/2015 Document Revised: 12/31/2015 Document Reviewed: 02/11/2015 Elsevier Interactive Patient Education  2017 [place] Prevention in the Home Falls can cause injuries. They can happen to people of all ages. There are many things you can do to make your home safe and to help prevent falls. What can I do on the outside of my home?  Regularly fix the edges of walkways and driveways and fix any cracks.  Remove anything that might make you trip as you walk through a door, such as a raised step or threshold.  Trim any bushes or trees on the path to your home.  Use bright outdoor lighting.  Clear any walking paths of anything that might make someone trip, such as rocks or tools.  Regularly check to see if handrails are loose or broken. Make sure that both sides of any steps have handrails.  Any raised decks and porches should have guardrails on the edges.  Have any leaves, snow, or ice cleared  regularly.  Use sand or salt on walking paths during winter.  Clean up any spills in your garage right away. This includes oil or grease spills. What can I do in the bathroom?  Use night lights.  Install grab bars by the toilet and in the tub and shower. Do not use towel bars as grab bars.  Use non-skid mats or decals in the tub or shower.  If you need to sit down in the shower, use a plastic, non-slip stool.  Keep the floor dry. Clean up any water that spills on the floor as soon as it happens.  Remove soap buildup in the tub or shower regularly.  Attach bath mats securely with double-sided non-slip rug tape.  Do not have throw rugs and other things on the floor that can make you trip. What can I do in the bedroom?  Use night lights.  Make sure that you have a light by your bed that is easy to reach.  Do not use any sheets or blankets that are too big for your bed. They should not hang down onto the floor.  Have a firm chair that has side arms. You can use this for support while you get dressed.  Do not have throw rugs and other things on  the floor that can make you trip. What can I do in the kitchen?  Clean up any spills right away.  Avoid walking on wet floors.  Keep items that you use a lot in easy-to-reach places.  If you need to reach something above you, use a strong step stool that has a grab bar.  Keep electrical cords out of the way.  Do not use floor polish or wax that makes floors slippery. If you must use wax, use non-skid floor wax.  Do not have throw rugs and other things on the floor that can make you trip. What can I do with my stairs?  Do not leave any items on the stairs.  Make sure that there are handrails on both sides of the stairs and use them. Fix handrails that are broken or loose. Make sure that handrails are as long as the stairways.  Check any carpeting to make sure that it is firmly attached to the stairs. Fix any carpet that is loose  or worn.  Avoid having throw rugs at the top or bottom of the stairs. If you do have throw rugs, attach them to the floor with carpet tape.  Make sure that you have a light switch at the top of the stairs and the bottom of the stairs. If you do not have them, ask someone to add them for you. What else can I do to help prevent falls?  Wear shoes that:  Do not have high heels.  Have rubber bottoms.  Are comfortable and fit you well.  Are closed at the toe. Do not wear sandals.  If you use a stepladder:  Make sure that it is fully opened. Do not climb a closed stepladder.  Make sure that both sides of the stepladder are locked into place.  Ask someone to hold it for you, if possible.  Clearly mark and make sure that you can see:  Any grab bars or handrails.  First and last steps.  Where the edge of each step is.  Use tools that help you move around (mobility aids) if they are needed. These include:  Canes.  Walkers.  Scooters.  Crutches.  Turn on the lights when you go into a dark area. Replace any light bulbs as soon as they burn out.  Set up your furniture so you have a clear path. Avoid moving your furniture around.  If any of your floors are uneven, fix them.  If there are any pets around you, be aware of where they are.  Review your medicines with your doctor. Some medicines can make you feel dizzy. This can increase your chance of falling. Ask your doctor what other things that you can do to help prevent falls. This information is not intended to replace advice given to you by your health care provider. Make sure you discuss any questions you have with your health care provider. Document Released: 02/06/2009 Document Revised: 09/18/2015 Document Reviewed: 05/17/2014 Elsevier Interactive Patient Education  2017 [employer].

## 2020-08-27 NOTE — Progress Notes (Signed)
Subjective:   Gabrielle [name] is a 60 y.o. female who presents for Medicare Annual (Subsequent) preventive examination.  I connected with [employer] by telephone and verified that I am speaking with the correct person using two identifiers. Location patient: home Location provider: work Persons participating in the virtual visit: patient, Marine scientist.    I discussed the limitations, risks, security and privacy concerns of performing an evaluation and management service by telephone and the availability of in person appointments. I also discussed with the patient that there may be a patient responsible charge related to this service. The patient expressed understanding and verbally consented to this telephonic visit.    Interactive audio and video telecommunications were attempted between this provider and patient, however failed, due to patient having technical difficulties OR patient did not have access to video capability.  We continued and completed visit with audio only.  Some vital signs may be absent or patient reported.   Time Spent with patient on telephone encounter: 25 minutes    Review of Systems     Cardiac Risk Factors include: dyslipidemia;sedentary lifestyle     Objective:    Today's Vitals   08/27/20 1246  Weight: 141 lb (64 kg)  Height: 5\' 4"  (1.626 m)   Body mass index is 24.2 kg/m.  Advanced Directives 08/27/2020 06/04/2014  Does Patient Have a Medical Advance Directive? Yes Yes  Type of Paramedic of [place];Living will Living will;Healthcare Power of Attorney  Does patient want to make changes to medical advance directive? - No - Patient declined  Copy of [place] in Chart? No - copy requested No - copy requested    Current Medications (verified) Outpatient Encounter Medications as of 08/27/2020  Medication Sig  . ALPRAZolam (XANAX) 0.5 MG tablet Take 1 tablet (0.5 mg total) by mouth 3 (three) times daily.  Marland Kitchen aspirin  EC 81 MG tablet Take by mouth.  Marland Kitchen atorvastatin (LIPITOR) 80 MG tablet TAKE 1 TABLET EVERY DAY  . ezetimibe (ZETIA) 10 MG tablet Take 1 tablet (10 mg total) by mouth daily.  Marland Kitchen lamoTRIgine (LAMICTAL) 200 MG tablet TAKE 1 TABLET (200 MG TOTAL) BY MOUTH DAILY.  . metoprolol succinate (TOPROL-XL) 25 MG 24 hr tablet TAKE 1/2 TABLET ONE TIME DAILY  . Metoprolol Succinate 25 MG CS24 Take 12.5 mg by mouth daily.  . Multiple Vitamins-Minerals (MULTIVITAMIN ADULT PO) Take by mouth daily.  Marland Kitchen omeprazole (PRILOSEC) 40 MG capsule Take 1 capsule (40 mg total) by mouth daily.  Marland Kitchen PARoxetine (PAXIL) 40 MG tablet Take 1 tablet (40 mg total) by mouth daily.  . sucralfate (CARAFATE) 1 g tablet TAKE 1 TABLET BEFORE EACH MEAL AND AT BEDTIME AS NEEDED FOR UPPER ABDOMINAL DISCOMFORT (Patient not taking: No sig reported)  . zolpidem (AMBIEN) 10 MG tablet Take 1 tablet (10 mg total) by mouth at bedtime as needed for sleep.   No facility-administered encounter medications on file as of 08/27/2020.    Allergies (verified) Sulfa antibiotics   History: Past Medical History:  Diagnosis Date  . Anxiety and depression   . CAD (coronary artery disease)    MI + stent 2018  . Colon cancer screening 04/2019   Cologuard NEG.  Repeat 04/2022  . Dyspepsia   . GERD (gastroesophageal reflux disease)   . History of myocardial infarction 2018  . 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  Marland Kitchen Hx of migraines    remote past  . Hypercholesterolemia   .  NHL (non-Hodgkin's lymphoma) ([place]) 2016   left peritoneal mesonteric mass  . Palpitations 2015   no monitoring was done-->resolved spontaneously   Past Surgical History:  Procedure Laterality Date  . biopsy  2016   Initial bx needle; then lab abd (done in [facility]) done to get further bx, exp lap--complications--sepsis/hemorrhage--memory/cognitive difficulties since that surgery/hospitalization--details somewhat cloudy from pt's info.  Marland Kitchen  BREAST ENHANCEMENT SURGERY  2003  . Heart Stent  2018   Prox LAD (murrells inlet).  Fatima Blank HERNIA REPAIR  2017   w/mesh  . TONSILLECTOMY    . TRANSTHORACIC ECHOCARDIOGRAM  09/2013   2015 ALL NORMAL.  2020 normal per pt report (Drr. Applegate with [facility] [place]  . WISDOM TOOTH EXTRACTION     Family History  Problem Relation Age of Onset  . Heart disease Mother   . Emphysema Mother   . Hypertension Mother   . Stroke Mother   . Depression Mother   . Heart failure Mother   . Hypertension Brother   . Depression Brother   . Stroke Maternal Aunt   . Depression Maternal Aunt   . CAD Maternal Aunt   . Colon cancer Other    Social History   Socioeconomic History  . Marital status: Married    Spouse name: Not on file  . Number of children: 0  . Years of education: Not on file  . Highest education level: Not on file  Occupational History  . Occupation: Disability  Tobacco Use  . Smoking status: Never Smoker  . Smokeless tobacco: Never Used  Substance and Sexual Activity  . Alcohol use: No  . Drug use: No  . Sexual activity: Yes    Birth control/protection: Condom  Other Topics Concern  . Not on file  Social History Narrative   Married, no children.   [place]ocated from [place] about 10/2018.   Educ: BA [employer], grad w/honors.   [employer] and others prior to disability.   Disabled due to bipolar, depression 2012.   No tob, alc.   No drug use.   Social Determinants of Health   Financial Resource Strain: Low Risk   . Difficulty of Paying Living Expenses: Not hard at all  Food Insecurity: No Food Insecurity  . Worried About Charity fundraiser in the Last Year: Never true  . Ran Out of Food in the Last Year: Never true  Transportation Needs: No Transportation Needs  . Lack of Transportation (Medical): No  . Lack of Transportation (Non-Medical): No  Physical Activity: Inactive  . Days of Exercise per Week: 0 days  . Minutes of Exercise per Session: 0  min  Stress: No Stress Concern Present  . Feeling of Stress : Not at all  Social Connections: Moderately Isolated  . Frequency of Communication with Friends and Family: More than three times a week  . Frequency of Social Gatherings with Friends and Family: Once a week  . Attends Religious Services: Never  . Active Member of Clubs or Organizations: No  . Attends Archivist Meetings: Never  . Marital Status: Married    Tobacco Counseling Counseling given: Not Answered   Clinical Intake:  Pre-visit preparation completed: Yes  Pain : No/denies pain     Nutritional Status: BMI of 19-24  Normal Nutritional Risks: None Diabetes: No  How often do you need to have someone help you when you read instructions, pamphlets, or other written materials from your doctor or pharmacy?: 1 - Never  Diabetic?No  Interpreter Needed?: No  Information entered by :: Thomasenia Sales LPN   Activities of Daily Living In your present state of health, do you have any difficulty performing the following activities: 08/27/2020  Hearing? N  Vision? N  Difficulty concentrating or making decisions? N  Walking or climbing stairs? N  Dressing or bathing? N  Doing errands, shopping? N  Preparing Food and eating ? N  Using the Toilet? N  In the past six months, have you accidently leaked urine? N  Do you have problems with loss of bowel control? N  Managing your Medications? N  Managing your Finances? N  Housekeeping or managing your Housekeeping? N  Some recent data might be hidden    Patient Care Team: Jeoffrey Massed, MD as PCP - General (Family Medicine) Candice Camp, MD as Consulting Physician (Obstetrics and Gynecology) Newt Lukes, MD as Referring Physician (Internal Medicine) Kathleene Hazel, MD as Consulting Physician (Cardiology) Rachael Fee, MD as Consulting Physician (Gastroenterology)  Indicate any recent Medical Services you may have received from other  than [facility] providers in the past year (date may be approximate).     Assessment:   This is a routine wellness examination for Jaleesa.  Hearing/Vision screen  Hearing Screening   125Hz  250Hz  500Hz  1000Hz  2000Hz  3000Hz  4000Hz  6000Hz  8000Hz   Right ear:           Left ear:           Comments: No issues  Vision Screening Comments: Wears glasses Last eye exam-02/2021  Dietary issues and exercise activities discussed: Current Exercise Habits: The patient does not participate in regular exercise at present, Exercise limited by: None identified  Goals Addressed            This Visit's Progress   . Patient Stated       Drink more water & cut back on sugar      Depression Screen PHQ 2/9 Scores 08/27/2020 04/02/2019  PHQ - 2 Score 1 0    Fall Risk Fall Risk  08/27/2020  Falls in the past year? 0  Number falls in past yr: 0  Injury with Fall? 0  Follow up Falls prevention discussed    FALL RISK PREVENTION PERTAINING TO THE HOME:  Any stairs in or around the home? Yes  If so, are there any without handrails? No  Home free of loose throw rugs in walkways, pet beds, electrical cords, etc? Yes  Adequate lighting in your home to reduce risk of falls? Yes   ASSISTIVE DEVICES UTILIZED TO PREVENT FALLS:  Life alert? No  Use of a cane, walker or w/c? No  Grab bars in the bathroom? No  Shower chair or bench in shower? No  Elevated toilet seat or a handicapped toilet? No   TIMED UP AND GO:  Was the test performed? No . Phone visit   Cognitive Function:Normal cognitive status assessed by  this Nurse Health Advisor. No abnormalities found.          Immunizations Immunization History  Administered Date(s) Administered  . Moderna Sars-Covid-2 Vaccination 08/01/2019, 08/28/2019  . Zoster Recombinat (Shingrix) 07/03/2019    TDAP status: Due, Education has been provided regarding the importance of this vaccine. Advised may receive this vaccine at local pharmacy or Health Dept.  Aware to provide a copy of the vaccination record if obtained from local pharmacy or Health Dept. Verbalized acceptance and understanding.  Flu Vaccine status: Due, Education has been provided regarding the importance  of this vaccine. Advised may receive this vaccine at local pharmacy or Health Dept. Aware to provide a copy of the vaccination record if obtained from local pharmacy or Health Dept. Verbalized acceptance and understanding.  Pneumococcal vaccine status: Not yet indicated  Covid-19 vaccine status: Information provided on how to obtain vaccines. Booster due  Qualifies for Shingles Vaccine? Yes   Zostavax completed No   Shingrix Completed?: Yes  Screening Tests Health Maintenance  Topic Date Due  . HIV Screening  Never done  . TETANUS/TDAP  Never done  . COLONOSCOPY (Pts 45-1yrs Insurance coverage will need to be confirmed)  Never done  . COVID-19 Vaccine (3 - Moderna risk 4-dose series) 09/25/2019  . Hepatitis C Screening  08/27/2021 (Originally Apr 18, 1961)  . INFLUENZA VACCINE  11/24/2020  . MAMMOGRAM  03/04/2021  . PAP SMEAR-Modifier  02/24/2022  . HPV VACCINES  Aged Out    Health Maintenance  Health Maintenance Due  Topic Date Due  . HIV Screening  Never done  . TETANUS/TDAP  Never done  . COLONOSCOPY (Pts 45-34yrs Insurance coverage will need to be confirmed)  Never done  . COVID-19 Vaccine (3 - Moderna risk 4-dose series) 09/25/2019    Colorectal cancer screening: Type of screening: Cologuard. Completed 05/02/2019. Repeat every 3 years  Mammogram status: Due-Declined  Bone Density status: Not yet indicated  Lung Cancer Screening: (Low Dose CT Chest recommended if Age 7-80 years, 30 pack-year currently smoking OR have quit w/in 15years.) does not qualify.    Additional Screening:  Hepatitis C Screening: does qualify; Declined  Vision Screening: Recommended annual ophthalmology exams for early detection of glaucoma and other disorders of the eye. Is the  patient up to date with their annual eye exam?  Yes  Who is the provider or what is the name of the office in which the patient attends annual eye exams? Pt unsure of name   Dental Screening: Recommended annual dental exams for proper oral hygiene  Community Resource Referral / Chronic Care Management: CRR required this visit?  No   CCM required this visit?  No      Plan:     I have personally reviewed and noted the following in the patient's chart:   . Medical and social history . Use of alcohol, tobacco or illicit drugs  . Current medications and supplements including opioid prescriptions.  . Functional ability and status . Nutritional status . Physical activity . Advanced directives . List of other physicians . Hospitalizations, surgeries, and ER visits in previous 12 months . Vitals . Screenings to include cognitive, depression, and falls . Referrals and appointments  In addition, I have reviewed and discussed with patient certain preventive protocols, quality metrics, and best practice recommendations. A written personalized care plan for preventive services as well as general preventive health recommendations were provided to patient.   Due to this being a telephonic visit, the after visit summary with patients personalized plan was offered to patient via mail or my-chart. Patient would like to access on my-chart.   Marta Antu, LPN   09/27/158  Nurse health Advisor  Nurse Notes: None

## 2020-08-28 ENCOUNTER — Other Ambulatory Visit: Payer: Self-pay

## 2020-08-28 ENCOUNTER — Encounter: Payer: Self-pay | Admitting: Family Medicine

## 2020-08-28 ENCOUNTER — Other Ambulatory Visit: Payer: Self-pay | Admitting: Family Medicine

## 2020-08-28 ENCOUNTER — Ambulatory Visit (INDEPENDENT_AMBULATORY_CARE_PROVIDER_SITE_OTHER): Payer: Medicare HMO | Admitting: Family Medicine

## 2020-08-28 VITALS — BP 99/64 | HR 65 | Temp 98.2°F | Resp 16 | Ht 64.0 in | Wt 141.6 lb

## 2020-08-28 DIAGNOSIS — G4700 Insomnia, unspecified: Secondary | ICD-10-CM

## 2020-08-28 DIAGNOSIS — E7800 Pure hypercholesterolemia, unspecified: Secondary | ICD-10-CM

## 2020-08-28 DIAGNOSIS — F3170 Bipolar disorder, currently in remission, most recent episode unspecified: Secondary | ICD-10-CM

## 2020-08-28 DIAGNOSIS — F411 Generalized anxiety disorder: Secondary | ICD-10-CM

## 2020-08-28 DIAGNOSIS — I10 Essential (primary) hypertension: Secondary | ICD-10-CM | POA: Diagnosis not present

## 2020-08-28 MED ORDER — EZETIMIBE 10 MG PO TABS: 10.0000 mg | ORAL_TABLET | Freq: Every day | ORAL | 3 refills | Status: DC

## 2020-08-28 MED ORDER — OMEPRAZOLE 40 MG PO CPDR: 40.0000 mg | DELAYED_RELEASE_CAPSULE | Freq: Every day | ORAL | 3 refills | Status: DC

## 2020-08-28 NOTE — Progress Notes (Signed)
OFFICE VISIT  08/28/2020  CC:  Chief Complaint  Patient presents with  . Follow-up    RCI; anxiety/depression, HTN   HPI:    Patient is a 60 y.o. female who presents for f/u anxiety and bipolar II, insomnia, HTN, HLD. A/P as of last visit: "1) Acute diverticulitis: resolved approp with antibiotics.  2) Hypercholesterolemia in pt with CAD/hx of MI. LDL 85 1 mo ago. Add zetia 10mg  qd to atorva 80 she is already on. Therapeutic expectations and side effect profile of medication discussed today.  Patient's questions answered. Return in 3 mo for FLP and hepatic panel.  3) Insomnia: in setting bipolar d/o and chronic anxiety. Past success with prn use of ambien, so we'll restart this today: 10mg  qhs prn, #180, RF x 1. Controlled substance contract reviewed with patient today.  Patient signed this and it will be placed in the chart."  INTERIM HX: Doing pretty good. She walks some for exercise. Eats reasonable portions but admits her food choices aren't good.  No home bp monitoring.  HLD: last lipids 3 mo ago showed great response to add-on of zetia-->LDL down to 63.  In general she says her mood and anxiety levels are stable. Still has intermittent periods of depression that is mild and lasts about a week.   Some period of increased anxiety but no panic attacks. Typically takes 1 1/2-2 tabs xanax qhs.  She sometimes still takes an [place] for sleep.  No adverse side effects.  She does not drink alcohol. She is aware of potential oversedation and resp depression when taking both xanax and ambien.  Attempted to review PMP AWARE but system was not responding.  ROS as above, plus--> no fevers, no CP, no SOB, no wheezing, no cough, no dizziness, no HAs, no rashes, no melena/hematochezia.  No polyuria or polydipsia.  No myalgias or arthralgias.  No focal weakness, paresthesias, or tremors.  No acute vision or hearing abnormalities.  No dysuria or unusual/new urinary urgency or frequency.   No recent changes in lower legs. No n/v/d or abd pain.  No palpitations.     Past Medical History:  Diagnosis Date  . Anxiety and depression   . CAD (coronary artery disease)    MI + stent 2018  . Colon cancer screening 04/2019   Cologuard NEG.  Repeat 04/2022  . Dyspepsia   . GERD (gastroesophageal reflux disease)   . History of myocardial infarction 2018  . Hodgkin's lymphoma ([place])    L peritoneal mesenteric mass-->resolved w/out treatment.  Marland Kitchen Hx of migraines    remote past  . Hypercholesterolemia   . NHL (non-Hodgkin's lymphoma) ([place]) 2016   left peritoneal mesonteric mass  . Palpitations 2015   no monitoring was done-->resolved spontaneously    Past Surgical History:  Procedure Laterality Date  . biopsy  2016   Initial bx needle; then lab abd (done in [facility]) done to get further bx, exp lap--complications--sepsis/hemorrhage--memory/cognitive difficulties since that surgery/hospitalization--details somewhat cloudy from pt's info.  Marland Kitchen BREAST ENHANCEMENT SURGERY  2003  . Heart Stent  2018   Prox LAD (murrells inlet).  Fatima Blank HERNIA REPAIR  2017   w/mesh  . TONSILLECTOMY    . TRANSTHORACIC ECHOCARDIOGRAM  09/2013   2015 ALL NORMAL.  2020 normal per pt report (Drr. Applegate with [facility] [place]  . WISDOM TOOTH EXTRACTION      Outpatient Medications Prior to Visit  Medication Sig Dispense Refill  . ALPRAZolam (XANAX) 0.5 MG tablet Take 1 tablet (0.5  mg total) by mouth 3 (three) times daily. 270 tablet 1  . aspirin EC 81 MG tablet Take by mouth.    Marland Kitchen atorvastatin (LIPITOR) 80 MG tablet TAKE 1 TABLET EVERY DAY 90 tablet 0  . lamoTRIgine (LAMICTAL) 200 MG tablet TAKE 1 TABLET (200 MG TOTAL) BY MOUTH DAILY. 90 tablet 3  . metoprolol succinate (TOPROL-XL) 25 MG 24 hr tablet TAKE 1/2 TABLET ONE TIME DAILY 45 tablet 1  . Multiple Vitamins-Minerals (MULTIVITAMIN ADULT PO) Take by mouth daily.    Marland Kitchen PARoxetine (PAXIL) 40 MG tablet Take 1 tablet (40 mg total) by mouth daily. 90  tablet 1  . zolpidem (AMBIEN) 10 MG tablet Take 1 tablet (10 mg total) by mouth at bedtime as needed for sleep. 90 tablet 1  . ezetimibe (ZETIA) 10 MG tablet Take 1 tablet (10 mg total) by mouth daily. 90 tablet 1  . Metoprolol Succinate 25 MG CS24 Take 12.5 mg by mouth daily. 45 capsule 3  . omeprazole (PRILOSEC) 40 MG capsule Take 1 capsule (40 mg total) by mouth daily. 90 capsule 3  . sucralfate (CARAFATE) 1 g tablet TAKE 1 TABLET BEFORE EACH MEAL AND AT BEDTIME AS NEEDED FOR UPPER ABDOMINAL DISCOMFORT (Patient not taking: No sig reported) 120 tablet 1   No facility-administered medications prior to visit.    Allergies  Allergen Reactions  . Sulfa Antibiotics Rash    ROS As per HPI  PE: Vitals with BMI 08/28/2020 08/27/2020 07/09/2020  Height 5\' 4"  5\' 4"  5\' 4"   Weight 141 lbs 10 oz 141 lbs 141 lbs 10 oz  BMI 24.29 19.14 78.29  Systolic 99 - 562  Diastolic 64 - 74  Pulse 65 - 70    Gen: Alert, well appearing.  Patient is oriented to person, place, time, and situation. AFFECT: pleasant, lucid thought and speech. CV: RRR, no m/r/g.   LUNGS: CTA bilat, nonlabored resps, good aeration in all lung fields. EXT: no clubbing or cyanosis.  no edema.    LABS:  Lab Results  Component Value Date   TSH 1.60 05/28/2014   Lab Results  Component Value Date   WBC 5.4 01/30/2020   HGB 13.6 01/30/2020   HCT 41.4 01/30/2020   MCV 100.9 (H) 01/30/2020   PLT 199.0 01/30/2020   Lab Results  Component Value Date   CREATININE 0.89 01/30/2020   BUN 17 01/30/2020   NA 138 01/30/2020   K 4.5 01/30/2020   CL 101 01/30/2020   CO2 32 01/30/2020   Lab Results  Component Value Date   ALT 40 (H) 05/29/2020   AST 36 05/29/2020   ALKPHOS 124 (H) 05/29/2020   BILITOT 0.6 05/29/2020   Lab Results  Component Value Date   CHOL 127 05/29/2020   Lab Results  Component Value Date   HDL 50.70 05/29/2020   Lab Results  Component Value Date   LDLCALC 63 05/29/2020   Lab Results  Component  Value Date   TRIG 69.0 05/29/2020   Lab Results  Component Value Date   CHOLHDL 3 05/29/2020   IMPRESSION AND PLAN:  1) HTN: good control on 12.5mg  toprol xl qd.  2) HLD: pt with CAD, hx of stent placement.   LDL was at goal of <70 three mo ago after zetia was added to her atorva 80mg  qd. Plan rpt flp and hepatic panel 3 mo.  3) Bipolar II + GAD and anxiety-related insomnia-->stable on lamictal 200mg  qd, paxil 40mg  qd, and 0.5mg  alpraz 0.5mg  tid prn.  She does occasionally take an ambien hs severe insomnia but once again I warned her of potential for oversedation and resp depression when taking this on same night as xanax.  She expressed understanding and states she feels no adverse effects when she does this. CSC UTD. No new rx for xanax or Lorrin Mais were done today.  4) Hx of vit B12 deficiency: pt states she was dx'd with this in remote past and was on b12 injections at one point in time.  Currently taking oral b12 supp.  She requests B12 level check at the time of next f/u in 3 mo.  An After Visit Summary was printed and given to the patient.  FOLLOW UP: Return in about 3 months (around 11/28/2020) for annual CPE (fasting). cpe 3 mo  Signed:  Crissie Sickles, MD           08/28/2020

## 2020-08-29 NOTE — Telephone Encounter (Signed)
Requesting: alprazolam Contract: 02/27/20 UDS: n/a Last Visit: 08/29/20 Next Visit: 11/28/20 Last Refill: 07/04/20(270,1)  Refill not appropriate.

## 2020-09-01 ENCOUNTER — Telehealth: Payer: Self-pay

## 2020-09-01 MED ORDER — ALPRAZOLAM 0.5 MG PO TABS: 0.5000 mg | ORAL_TABLET | Freq: Three times a day (TID) | ORAL | 1 refills | Status: DC

## 2020-09-01 NOTE — Telephone Encounter (Signed)
A user error has taken place: encounter opened in error, closed for administrative reasons.

## 2020-09-01 NOTE — Telephone Encounter (Signed)
Alprazolam eRx'd. 

## 2020-09-01 NOTE — Telephone Encounter (Signed)
ALPRAZolam (XANAX) 0.5 MG tablet [[id number]]   Dose: 0.5 mg Route: Oral Frequency: 3 times daily  Dispense Quantity: 270 tablet Refills: 1        Sig: Take 1 tablet (0.5 mg total) by mouth 3 (three) times daily.       Start Date: 07/04/20 End Date: --  Written Date: 07/04/20 Expiration Date: 12/31/20   Pt states that Rx is usually at the [facility] and the last time she needed medication was just supposed to be a bridge refill. Rx is currently at the drug store but needs it to be sent to the [facility] mail delivery.

## 2020-09-01 NOTE — Telephone Encounter (Signed)
Spoke with patient regarding results/recommendations.  

## 2020-09-01 NOTE — Telephone Encounter (Signed)
See other encounter  Requesting: alprazolam Contract:04/02/19 UDS:n/a Last Visit:08/28/20 Next Visit:11/28/20 Last Refill: 07/04/20  Please Advise

## 2020-09-01 NOTE — Telephone Encounter (Signed)
Patient refill request  ALPRAZolam Duanne Moron) 0.5 MG tablet [[id number]]    [facility]

## 2020-10-02 ENCOUNTER — Other Ambulatory Visit: Payer: Self-pay | Admitting: Family Medicine

## 2020-10-16 ENCOUNTER — Other Ambulatory Visit: Payer: Self-pay

## 2020-10-16 ENCOUNTER — Emergency Department (HOSPITAL_BASED_OUTPATIENT_CLINIC_OR_DEPARTMENT_OTHER): Payer: Medicare HMO

## 2020-10-16 ENCOUNTER — Emergency Department (HOSPITAL_BASED_OUTPATIENT_CLINIC_OR_DEPARTMENT_OTHER)
Admission: EM | Admit: 2020-10-16 | Discharge: 2020-10-16 | Disposition: A | Discharge status: Home / Self Care | Payer: Medicare HMO | Attending: Emergency Medicine | Admitting: Emergency Medicine

## 2020-10-16 ENCOUNTER — Encounter (HOSPITAL_BASED_OUTPATIENT_CLINIC_OR_DEPARTMENT_OTHER): Payer: Self-pay | Admitting: *Deleted

## 2020-10-16 ENCOUNTER — Telehealth: Payer: Self-pay | Admitting: Family Medicine

## 2020-10-16 DIAGNOSIS — R10817 Generalized abdominal tenderness: Secondary | ICD-10-CM | POA: Diagnosis not present

## 2020-10-16 DIAGNOSIS — R9431 Abnormal electrocardiogram [ECG] [EKG]: Secondary | ICD-10-CM | POA: Diagnosis not present

## 2020-10-16 DIAGNOSIS — I880 Nonspecific mesenteric lymphadenitis: Secondary | ICD-10-CM | POA: Diagnosis not present

## 2020-10-16 DIAGNOSIS — Z7982 Long term (current) use of aspirin: Secondary | ICD-10-CM | POA: Diagnosis not present

## 2020-10-16 DIAGNOSIS — R590 Localized enlarged lymph nodes: Secondary | ICD-10-CM

## 2020-10-16 DIAGNOSIS — R197 Diarrhea, unspecified: Secondary | ICD-10-CM | POA: Diagnosis not present

## 2020-10-16 DIAGNOSIS — I2510 Atherosclerotic heart disease of native coronary artery without angina pectoris: Secondary | ICD-10-CM | POA: Insufficient documentation

## 2020-10-16 DIAGNOSIS — R109 Unspecified abdominal pain: Secondary | ICD-10-CM | POA: Diagnosis not present

## 2020-10-16 LAB — COMPREHENSIVE METABOLIC PANEL
ALT: 23 U/L (ref 0–44)
AST: 22 U/L (ref 15–41)
Albumin: 4.1 g/dL (ref 3.5–5.0)
Alkaline Phosphatase: 105 U/L (ref 38–126)
Anion gap: 7 (ref 5–15)
BUN: 16 mg/dL (ref 6–20)
CO2: 29 mmol/L (ref 22–32)
Calcium: 9.2 mg/dL (ref 8.9–10.3)
Chloride: 106 mmol/L (ref 98–111)
Creatinine, Ser: 0.67 mg/dL (ref 0.44–1.00)
GFR, Estimated: >60 mL/min (ref >60)
Glucose, Bld: 110 mg/dL — ABNORMAL HIGH (ref 70–99)
Potassium: 3.6 mmol/L (ref 3.5–5.1)
Sodium: 142 mmol/L (ref 135–145)
Total Bilirubin: 0.6 mg/dL (ref 0.3–1.2)
Total Protein: 5.9 g/dL — ABNORMAL LOW (ref 6.5–8.1)

## 2020-10-16 LAB — CBC WITH DIFFERENTIAL/PLATELET
Abs Immature Granulocytes: 0.01 10*3/uL (ref 0.00–0.07)
Basophils Absolute: 0 10*3/uL (ref 0.0–0.1)
Basophils Relative: 1 %
Eosinophils Absolute: 0.2 10*3/uL (ref 0.0–0.5)
Eosinophils Relative: 4 %
HCT: 39.5 % (ref 36.0–46.0)
Hemoglobin: 13.1 g/dL (ref 12.0–15.0)
Immature Granulocytes: 0 %
Lymphocytes Relative: 25 %
Lymphs Abs: 1.5 10*3/uL (ref 0.7–4.0)
MCH: 33.4 pg (ref 26.0–34.0)
MCHC: 33.2 g/dL (ref 30.0–36.0)
MCV: 100.8 fL — ABNORMAL HIGH (ref 80.0–100.0)
Monocytes Absolute: 0.4 10*3/uL (ref 0.1–1.0)
Monocytes Relative: 6 %
Neutro Abs: 4 10*3/uL (ref 1.7–7.7)
Neutrophils Relative %: 64 %
Platelets: 165 10*3/uL (ref 150–400)
RBC: 3.92 MIL/uL (ref 3.87–5.11)
RDW: 12.6 % (ref 11.5–15.5)
WBC: 6.2 10*3/uL (ref 4.0–10.5)
nRBC: 0 % (ref 0.0–0.2)

## 2020-10-16 LAB — URINALYSIS, ROUTINE W REFLEX MICROSCOPIC
Bilirubin Urine: NEGATIVE
Glucose, UA: NEGATIVE mg/dL
Hgb urine dipstick: NEGATIVE
Ketones, ur: NEGATIVE mg/dL
Leukocytes,Ua: NEGATIVE
Nitrite: NEGATIVE
Protein, ur: NEGATIVE mg/dL
Specific Gravity, Urine: 1.02 (ref 1.005–1.030)
pH: 7 (ref 5.0–8.0)

## 2020-10-16 LAB — LIPASE, BLOOD: Lipase: 20 U/L (ref 11–51)

## 2020-10-16 MED ORDER — SODIUM CHLORIDE 0.9 % IV BOLUS
1000.0000 mL | Freq: Once | INTRAVENOUS | Status: AC
  Administered 2020-10-16: 1000 mL via INTRAVENOUS

## 2020-10-16 MED ORDER — OXYCODONE-ACETAMINOPHEN 5-325 MG PO TABS: 1.0000 | ORAL_TABLET | Freq: Three times a day (TID) | ORAL | 0 refills | Status: DC | PRN

## 2020-10-16 MED ORDER — ONDANSETRON 8 MG PO TBDP: 8.0000 mg | ORAL_TABLET | Freq: Three times a day (TID) | ORAL | 0 refills | Status: DC | PRN

## 2020-10-16 MED ORDER — IOHEXOL 300 MG/ML  SOLN
80.0000 mL | Freq: Once | INTRAMUSCULAR | Status: AC | PRN
  Administered 2020-10-16: 80 mL via INTRAVENOUS

## 2020-10-16 NOTE — ED Notes (Signed)
S1 S2 good, lungs sounds clear. Bowel sounds present. Pt cmplains of pain of 4 out 10 on RLQ that radiates to the middle lower quadrant area.

## 2020-10-16 NOTE — ED Provider Notes (Signed)
  Physical Exam  BP (!) 130/54   Pulse 73   Temp 98 F (36.7 C) (Oral)   Resp 15   Ht 5\' 4"  (1.626 m)   Wt 63.5 kg   LMP 10/20/2011   SpO2 100%   BMI 24.03 kg/m   Physical Exam  ED Course/Procedures     Procedures  MDM   Assuming care of patient from Dr. Regenia Skeeter.   Patient in the ED for abd discomfort. Hx of lymphoma. Workup thus far shows normal labs.  Concerning findings are as following : none Important pending results are CT abdomen  According to Dr. Regenia Skeeter, plan is to d/c post CT   Patient had no complains, no concerns from the nursing side. Will continue to monitor.  5:10 PM Ct results reviewed with the patient. Advised oncology f/u        Varney Biles, MD 10/16/20 1710

## 2020-10-16 NOTE — ED Triage Notes (Signed)
Abdominal pain since yesterday.  Denies emesis.

## 2020-10-16 NOTE — Telephone Encounter (Signed)
Transferred to Santiago Glad with [facility] for abdominal pain - pt reports pain started Tuesday and also has diarrhea. She was concerned that her area had a water boil advisory for a few day before she was aware and had been drinking the water.

## 2020-10-16 NOTE — ED Notes (Signed)
Patient verbalizes understanding of discharge instructions. Opportunity for questioning and answers were provided. Patient discharged from ED.  °

## 2020-10-16 NOTE — ED Provider Notes (Signed)
[facility] EMERGENCY DEPT Provider Note   CSN: [id number] Arrival date & time: 10/16/20  1318     History Chief Complaint  Patient presents with   Abdominal Pain    Gabrielle [name] is a 60 y.o. female.  HPI 60 year old female presents with abdominal pain.  Started 2 days ago.  Has a history of non-Hodgkin's lymphoma that was diagnosed as an abdominal mass that she states resolved after a surgery without the treatment of chemotherapy.  This was several years ago.  She also had a complicated course after the surgery.  Starting 2 days ago she has had continuous moderate pain that she describes as a 4 out of 10, like someone punched her in the gut.  It is mostly periumbilical.  She had a couple episodes of diarrhea, took some Imodium and it seemed to improve.  No nausea or vomiting.  No chest pain, fevers or urinary symptoms.  Past Medical History:  Diagnosis Date   Anxiety and depression    CAD (coronary artery disease)    MI + stent 2018   Colon cancer screening 04/2019   Cologuard NEG.  Repeat 04/2022   Dyspepsia    GERD (gastroesophageal reflux disease)    History of myocardial infarction 2018   Hodgkin's lymphoma (HCC)    L peritoneal mesenteric mass-->resolved w/out treatment.   Hx of migraines    remote past   Hypercholesterolemia    NHL (non-Hodgkin's lymphoma) ([place]) 2016   left peritoneal mesonteric mass   Palpitations 2015   no monitoring was done-->resolved spontaneously    Patient Active Problem List   Diagnosis Date Noted   Chronic coronary artery disease 03/12/2019   Myocardial infarction ([place]) 03/04/2017   Diffuse non-Hodgkin's lymphoma ([place]) 03/04/2014   Palpitations 09/24/2013   Dyspnea 09/24/2013    Past Surgical History:  Procedure Laterality Date   biopsy  2016   Initial bx needle; then lab abd (done in [facility]) done to get further bx, exp lap--complications--sepsis/hemorrhage--memory/cognitive difficulties since that  surgery/hospitalization--details somewhat cloudy from pt's info.   BREAST ENHANCEMENT SURGERY  2003   Heart Stent  2018   Prox LAD (murrells inlet).   INCISIONAL HERNIA REPAIR  2017   w/mesh   TONSILLECTOMY     TRANSTHORACIC ECHOCARDIOGRAM  09/2013   2015 ALL NORMAL.  2020 normal per pt report (Drr. Metta Clines with [facility] [place]   WISDOM TOOTH EXTRACTION       OB History     Gravida  0   Para      Term      Preterm      AB      Living         SAB      IAB      Ectopic      Multiple      Live Births              Family History  Problem Relation Age of Onset   Heart disease Mother    Emphysema Mother    Hypertension Mother    Stroke Mother    Depression Mother    Heart failure Mother    Hypertension Brother    Depression Brother    Stroke Maternal Aunt    Depression Maternal Aunt    CAD Maternal Aunt    Colon cancer Other     Social History   Tobacco Use   Smoking status: Never   Smokeless tobacco: Never  Vaping Use  Vaping Use: Never used  Substance Use Topics   Alcohol use: No   Drug use: No    Home Medications Prior to Admission medications   Medication Sig Start Date End Date Taking? Authorizing Provider  ALPRAZolam Duanne Moron) 0.5 MG tablet Take 1 tablet (0.5 mg total) by mouth 3 (three) times daily. 09/01/20  Yes McGowen, Adrian Blackwater, MD  aspirin EC 81 MG tablet Take by mouth. 03/12/19  Yes [provider]  atorvastatin (LIPITOR) 80 MG tablet TAKE 1 TABLET EVERY DAY 10/02/20  Yes McGowen, Adrian Blackwater, MD  ezetimibe (ZETIA) 10 MG tablet Take 1 tablet (10 mg total) by mouth daily. 08/28/20  Yes McGowen, Adrian Blackwater, MD  lamoTRIgine (LAMICTAL) 200 MG tablet TAKE 1 TABLET (200 MG TOTAL) BY MOUTH DAILY. 07/14/20  Yes McGowen, Adrian Blackwater, MD  metoprolol succinate (TOPROL-XL) 25 MG 24 hr tablet TAKE 1/2 TABLET ONE TIME DAILY 08/06/20  Yes McGowen, Adrian Blackwater, MD  Multiple Vitamins-Minerals (MULTIVITAMIN ADULT PO) Take by mouth daily.   Yes [provider]  omeprazole (PRILOSEC) 40 MG capsule Take 1 capsule (40 mg total) by mouth daily. 08/28/20  Yes McGowen, Adrian Blackwater, MD  PARoxetine (PAXIL) 40 MG tablet Take 1 tablet (40 mg total) by mouth daily. 07/17/20  Yes McGowen, Adrian Blackwater, MD  zolpidem (AMBIEN) 10 MG tablet Take 1 tablet (10 mg total) by mouth at bedtime as needed for sleep. 02/27/20 03/28/20  McGowen, Adrian Blackwater, MD    Allergies    Sulfa antibiotics  Review of Systems   Review of Systems  Constitutional:  Negative for fever.  Respiratory:  Negative for shortness of breath.   Cardiovascular:  Negative for chest pain.  Gastrointestinal:  Positive for abdominal pain and diarrhea. Negative for vomiting.  Genitourinary:  Negative for dysuria.  All other systems reviewed and are negative.  Physical Exam Updated Vital Signs BP 124/67   Pulse 69   Temp 98 F (36.7 C) (Oral)   Resp 11   Ht 5\' 4"  (1.626 m)   Wt 63.5 kg   LMP 10/20/2011   SpO2 97%   BMI 24.03 kg/m   Physical Exam Vitals and nursing note reviewed.  Constitutional:      General: She is not in acute distress.    Appearance: She is well-developed. She is not ill-appearing or diaphoretic.  HENT:     Head: Normocephalic and atraumatic.     Right Ear: External ear normal.     Left Ear: External ear normal.     Nose: Nose normal.  Eyes:     General:        Right eye: No discharge.        Left eye: No discharge.  Cardiovascular:     Rate and Rhythm: Normal rate and regular rhythm.     Heart sounds: Normal heart sounds.  Pulmonary:     Effort: Pulmonary effort is normal.     Breath sounds: Normal breath sounds.  Abdominal:     Palpations: Abdomen is soft.     Tenderness: There is generalized abdominal tenderness.  Skin:    General: Skin is warm and dry.  Neurological:     Mental Status: She is alert.  Psychiatric:        Mood and Affect: Mood is not anxious.    ED Results / Procedures / Treatments   Labs (all labs ordered are listed, but only  abnormal results are displayed) Labs Reviewed  COMPREHENSIVE METABOLIC PANEL - Abnormal; Notable for  the following components:      Result Value   Glucose, Bld 110 (*)    Total Protein 5.9 (*)    All other components within normal limits  CBC WITH DIFFERENTIAL/PLATELET - Abnormal; Notable for the following components:   MCV 100.8 (*)    All other components within normal limits  LIPASE, BLOOD  URINALYSIS, ROUTINE W REFLEX MICROSCOPIC    EKG EKG Interpretation  Date/Time:  Thursday October 16 2020 14:11:26 EDT Ventricular Rate:  71 PR Interval:  168 QRS Duration: 92 QT Interval:  386 QTC Calculation: 420 R Axis:   39 Text Interpretation: Sinus rhythm Abnormal R-wave progression, early transition No old tracing to compare Confirmed by Sherwood Gambler [phone]) on 10/16/2020 2:17:28 PM  Radiology No results found.  Procedures Procedures   Medications Ordered in ED Medications  iohexol (OMNIPAQUE) 300 MG/ML solution 80 mL (has no administration in time range)  sodium chloride 0.9 % bolus 1,000 mL (1,000 mLs Intravenous New Bag/Given 10/16/20 1418)    ED Course  I have reviewed the triage vital signs and the nursing notes.  Pertinent labs & imaging results that were available during my care of the patient were reviewed by me and considered in my medical decision making (see chart for details).    MDM Rules/Calculators/A&P                          Lab work reviewed and is overall unremarkable.  Given her history along with the diffuse tenderness, CT scan will be obtained.  Disposition will be determined based on these results.  Care transferred to Dr. Kathrynn Humble. Final Clinical Impression(s) / ED Diagnoses Final diagnoses:  None    Rx / DC Orders ED Discharge Orders     None        Sherwood Gambler, MD 10/16/20 [phone]

## 2020-10-16 NOTE — Discharge Instructions (Addendum)
Please call the Oncologist for further assessment of the lymphadenopathy. Pain and nausea medicine prescribed for as needed use.

## 2020-10-17 NOTE — Telephone Encounter (Signed)
Pt seen in ED   [place] Day - Client TELEPHONE ADVICE RECORD [facility] Patient Name: Gabrielle [name] RD Gender: Female DOB: [birth year] Age: 60 Y 2 M 19 D Return Phone Number: [phone] (Primary) Address: City/ State/ Zip: [place] [address] Client [name] Day - Client Client Site [place] - Day Contact Type Call Who Is Calling Patient / Member / Family / Caregiver Call Type Triage / Clinical Relationship To Patient Self Return Phone Number [phone] (Primary) Chief Complaint Abdominal Pain Reason for Call Symptomatic / Request for Elizabeth states that she has abdominal pain and diarrhea. Translation No Nurse Assessment Nurse: Loletha Carrow, RN, Ronalee Belts Date/Time (Eastern Time): 10/16/2020 10:46:54 AM Confirm and document reason for call. If symptomatic, describe symptoms. ---Caller states: I have R abdominal pain since Tuesday and diarrhea yesterday. Denies any other symptoms possible contaminated water consumption. Does the patient have any new or worsening symptoms? ---Yes Will a triage be completed? ---Yes Related visit to physician within the last 2 weeks? ---No Does the PT have any chronic conditions? (i.e. diabetes, asthma, this includes High risk factors for pregnancy, etc.) ---Yes List chronic conditions. ---R abdominal surgery years ago, non Hodgkin's lymphoma Is this a behavioral health or substance abuse call? ---No Guidelines Guideline Title Affirmed Question Affirmed Notes Nurse Date/Time (Eastern Time) Abdominal Pain - Female [1] MILDMODERATE pain AND [2] constant AND [3] present > 2 hours Emch, RN, Ronalee Belts 10/16/2020 10:50:33 AM Disp. Time Eilene Ghazi Time) Disposition Final User 10/16/2020 10:53:14 AM See HCP within 4 Hours (or PCP triage) Yes Emch, RN, Ronalee Belts PLEASE NOTE: All timestamps contained within this report are represented as [place] Standard  Time. CONFIDENTIALTY NOTICE: This fax transmission is intended only for the addressee. It contains information that is legally privileged, confidential or otherwise protected from use or disclosure. If you are not the intended recipient, you are strictly prohibited from reviewing, disclosing, copying using or disseminating any of this information or taking any action in reliance on or regarding this information. If you have received this fax in error, please notify us immediately by telephone so that we can arrange for its return to [place]. Phone: [phone], Toll-Free: [phone], Fax: [phone] Page: 2 of 2 Call Id: [id number] [place] Disagree/Comply Comply Caller Understands Yes PreDisposition Did not know what to do Care Advice Given Per Guideline SEE HCP (OR PCP TRIAGE) WITHIN 4 HOURS: * IF OFFICE WILL BE OPEN: You need to be seen within the next 3 or 4 hours. Call your doctor (or NP/PA) now or as soon as the office opens. REST: * Lie down and rest. NOTHING BY MOUTH: * Do not eat or drink anything for now. CALL BACK IF: * You become worse CARE ADVICE given per Abdominal Pain, Female (Adult) guideline. Referrals REFERRED TO PCP OFFICE

## 2020-10-20 ENCOUNTER — Telehealth: Payer: Self-pay | Admitting: Oncology

## 2020-10-20 NOTE — Telephone Encounter (Signed)
Gabrielle [name] has been referred from the ED for mesenteric lymphadenopathy. [name] has been scheduled to see Dr. Benay Spice on 7/8 at 2pm. Appt date and time has been given to both the pt and her husband. Aware to arrive 15 minutes early.

## 2020-10-30 ENCOUNTER — Telehealth: Payer: Self-pay | Admitting: *Deleted

## 2020-10-30 NOTE — Telephone Encounter (Signed)
Called patient to confirm new appointment on 10/31/20 at 1400 w/arrival at 1545. She is aware of location of office. Instructed her parking and how to enter [employer] Explained what will occur at the appointment and that she is allowed one visitor and both should wear mask. Requested they call to cancel appointment for any covid infection or exposures prior to visit.

## 2020-10-31 ENCOUNTER — Inpatient Hospital Stay: Payer: Medicare HMO | Attending: Oncology | Admitting: Oncology

## 2020-10-31 ENCOUNTER — Other Ambulatory Visit: Payer: Self-pay

## 2020-10-31 VITALS — BP 131/72 | HR 66 | Temp 97.8°F | Resp 20 | Ht 64.0 in | Wt 139.2 lb

## 2020-10-31 DIAGNOSIS — C8580 Other specified types of non-Hodgkin lymphoma, unspecified site: Secondary | ICD-10-CM | POA: Diagnosis not present

## 2020-10-31 DIAGNOSIS — I2510 Atherosclerotic heart disease of native coronary artery without angina pectoris: Secondary | ICD-10-CM

## 2020-10-31 DIAGNOSIS — F411 Generalized anxiety disorder: Secondary | ICD-10-CM

## 2020-10-31 DIAGNOSIS — E785 Hyperlipidemia, unspecified: Secondary | ICD-10-CM | POA: Diagnosis not present

## 2020-10-31 NOTE — Progress Notes (Signed)
[facility] New Patient Consult   Requesting MD: Tammi Sou, Md [address],  [place] [address]   Gabrielle [name] 60 y.o.  Sep 18, 1960    Reason for Consult: Non-Hodgkin's lymphoma   HPI: Gabrielle [name] was diagnosed with non-Hodgkin's lymphoma in 2016.  She reports moving to the [facility] area at the time of the diagnosis. She presented with abdominal bloating.  She was seen by Dr. Ardis Hughs.  A CT of the abdomen pelvis on 05/29/2014 revealed a lobulated mass in the left mid abdomen abutting adjacent bowel.  No adenopathy or ascites.  She was referred for CT-guided biopsy of the abdominal mass on 06/04/2014.  The pathology revealed a B-cell non-Hodgkin lymphoma, follicular center cell type.  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  An abundance of large lymphocytes or admixed with small angulated lymphocytes.  Flow cytometry confirmed a monoclonal lambda restricted B-cell population with CD10 expression. She relocated to [place].  She was followed by Dr. Jacquelynn Cree in medical oncology.  She reports undergoing a laparoscopic biopsy.  The biopsy was complicated by bowel perforation requiring repeat surgery and a prolonged recovery.  Gabrielle [name] states the lymphoma improved on subsequent CTs and PET scans and did not require treatment.  Records from Dr. Jacquelynn Cree are not available today.  She moved back to [place] approximately 2 years ago.  She presented to the emergency room on 10/16/2020 with a 2-day history of right-sided abdominal pain.  A CT of the abdomen pelvis on 10/16/2020 revealed abnormal mesenteric adenopathy in the left abdomen at the site of the previous mesenteric mass.  The majority the lymph nodes measured 5-10 mm with 1 node measuring 15 mm.  There is associated mesenteric edema.  A separate right central mesenteric node measured 8 mm.  No retroperitoneal upper abdominal adenopathy.  No pelvic sidewall adenopathy.  No ascites.   There was equivocal thickening of the third portion of the duodenum.  She reports resolution of the abdominal pain.  No complaint today.  Past Medical History:  Diagnosis Date   Anxiety and depression    CAD (coronary artery disease)    MI + stent 2018   Colon cancer screening 04/2019   Cologuard NEG.  Repeat 04/2022   Dyspepsia    GERD (gastroesophageal reflux disease)    History of myocardial infarction 2018   Hodgkin's lymphoma (HCC)    L peritoneal mesenteric mass-->resolved w/out treatment.   Hx of migraines    remote past   Hypercholesterolemia    NHL (non-Hodgkin's lymphoma) ([place]) 2016   left peritoneal mesonteric mass   Palpitations 2015   no monitoring was done-->resolved spontaneously    .  G0, P0  Past Surgical History:  Procedure Laterality Date   biopsy  2016   Initial bx needle; then lab abd (done in [facility]) done to get further bx, exp lap--complications--sepsis/hemorrhage--memory/cognitive difficulties since that surgery/hospitalization--details somewhat cloudy from pt's info.   BREAST ENHANCEMENT SURGERY  2003   Heart Stent  2018   Prox LAD (murrells inlet).   INCISIONAL HERNIA REPAIR  2017   w/mesh   TONSILLECTOMY     TRANSTHORACIC ECHOCARDIOGRAM  09/2013   2015 ALL NORMAL.  2020 normal per pt report (Drr. Metta Clines with [facility] [place]   WISDOM TOOTH EXTRACTION      Medications: Reviewed  Allergies:  Allergies  Allergen Reactions   Sulfa Antibiotics Rash    Family history: No family history of cancer  Social History:   She lives with her husband in [place].  She has been on disability since 2013 secondary to an anxiety disorder.  She previously worked in a textile occupation.  She does not use cigarettes or alcohol.  No transfusion history.  No risk factor for HIV or hepatitis.  She has received COVID-19 vaccines.  ROS:   Positives include: None  A complete ROS was otherwise negative.  Physical Exam:  Blood pressure 131/72, pulse 66,  temperature 97.8 F (36.6 C), temperature source Oral, resp. rate 20, height _0  (1.626 m), weight 139 lb 3.2 oz (63.1 kg), last menstrual period 10/20/2011, SpO2 99 %.  HEENT: Neck without mass Lungs: Clear bilaterally Cardiac: Regular rate and rhythm Abdomen: No mass, nontender, no hepatosplenomegaly  Vascular: No leg edema Lymph nodes: No cervical, supraclavicular, axillary, or inguinal nodes Neurologic: Alert and oriented, the motor exam appears intact in the upper and lower extremities bilaterally Skin: No rash Musculoskeletal: No spine tenderness   LAB:  CBC  Lab Results  Component Value Date   WBC 6.2 10/16/2020   HGB 13.1 10/16/2020   HCT 39.5 10/16/2020   MCV 100.8 (H) 10/16/2020   PLT 165 10/16/2020   NEUTROABS 4.0 10/16/2020        CMP  Lab Results  Component Value Date   NA 142 10/16/2020   K 3.6 10/16/2020   CL 106 10/16/2020   CO2 29 10/16/2020   GLUCOSE 110 (H) 10/16/2020   BUN 16 10/16/2020   CREATININE 0.67 10/16/2020   CALCIUM 9.2 10/16/2020   PROT 5.9 (L) 10/16/2020   ALBUMIN 4.1 10/16/2020   AST 22 10/16/2020   ALT 23 10/16/2020   ALKPHOS 105 10/16/2020   BILITOT 0.6 10/16/2020   GFRNONAA >60 10/16/2020   GFRAA  06/20/2010    >60        The eGFR has been calculated using the MDRD equation. This calculation has not been validated in all clinical situations. eGFR's persistently <60 mL/min signify possible Chronic Kidney Disease.     Imaging: CT images from 10/16/2020 and 05/29/2014 reviewed with Gabrielle [name] and her husband    Assessment/Plan:   Non-Hodgkin lymphoma, B-cell follicular center cell type CT abdomen/pelvis 05/29/2014-left abdominal intraperitoneal mass, subcentimeter liver lesions felt to be benign, no adenopathy CT-guided biopsy of left abdominal mass 06/04/2014-non-Hodgkin B-cell lymphoma, follicular center cell type, high-grade favored, abundance of large lymphocytes admixed with small angulated lymphocytes Relocated to  [facility], underwent an open diagnostic biopsy 07/16/2014-pathology from mesenteric lymph node biopsies revealed sclerosing follicular B-cell lymphoma, CD20 positive, CD10 positive, predominantly diffuse architecture, low-grade.  Flow cytometry revealed a monoclonal B-cell population, lambda light chain restricted, 12.9% large cells, c-Myc rearrangement not detected, KI-67 20% Reports that she did not have treatment for lymphoma and underwent repeat imaging in [place] with no evidence of disease progression Coronary artery disease Anxiety and depression Hyperlipidemia   Disposition:   Gabrielle [name] was diagnosed with non-Hodgkin's lymphoma when she presented with a left abdominal mass in 2016.  Biopsy of the abdominal mass confirmed a follicular center cell type non-Hodgkin's lymphoma with suspicion of a high-grade lymphoma on needle biopsy. She relocated to [place] and underwent an open biopsy (we do not have complete records from [place] available today).  She had a complicated recovery after the open biopsy and did not have treatment for lymphoma.  She presented with abdominal pain 2 weeks ago and was found to have small mesenteric lymph nodes.  The pain has resolved.  I suspect the lymph nodes seen on the current CT are indicative of residual/progressive lymphoma.  She is asymptomatic at present.  The lymphoma has behaved in an indolent fashion as she is now greater than 6 years out from diagnosis.  I recommend continued observation.  She will return for an office and lab visit in 4 months.  She will call for recurrent abdominal pain or new symptoms.  We will request records from Dr. Jacquelynn Cree to include office notes and CT/PET reports.  The MCV was elevated in the emergency room on 10/16/2020.  Review of the electronic record reveals an elevated MCV in October 2021.  She is not anemic.  We will check a B12 level when she returns in 4 months.    Betsy Coder, MD  10/31/2020, 4:03 PM

## 2020-11-28 ENCOUNTER — Other Ambulatory Visit: Payer: Self-pay

## 2020-11-28 ENCOUNTER — Encounter: Payer: Self-pay | Admitting: Family Medicine

## 2020-11-28 ENCOUNTER — Ambulatory Visit (INDEPENDENT_AMBULATORY_CARE_PROVIDER_SITE_OTHER): Payer: Medicare HMO | Admitting: Family Medicine

## 2020-11-28 VITALS — BP 114/76 | HR 73 | Temp 98.6°F | Resp 16 | Ht 63.0 in | Wt 140.4 lb

## 2020-11-28 DIAGNOSIS — F5104 Psychophysiologic insomnia: Secondary | ICD-10-CM | POA: Diagnosis not present

## 2020-11-28 DIAGNOSIS — F411 Generalized anxiety disorder: Secondary | ICD-10-CM

## 2020-11-28 DIAGNOSIS — F319 Bipolar disorder, unspecified: Secondary | ICD-10-CM | POA: Diagnosis not present

## 2020-11-28 DIAGNOSIS — I10 Essential (primary) hypertension: Secondary | ICD-10-CM

## 2020-11-28 DIAGNOSIS — E7800 Pure hypercholesterolemia, unspecified: Secondary | ICD-10-CM

## 2020-11-28 DIAGNOSIS — Z0000 Encounter for general adult medical examination without abnormal findings: Secondary | ICD-10-CM

## 2020-11-28 DIAGNOSIS — E538 Deficiency of other specified B group vitamins: Secondary | ICD-10-CM

## 2020-11-28 DIAGNOSIS — Z79899 Other long term (current) drug therapy: Secondary | ICD-10-CM | POA: Diagnosis not present

## 2020-11-28 MED ORDER — PAROXETINE HCL 40 MG PO TABS: 40.0000 mg | ORAL_TABLET | Freq: Every day | ORAL | 3 refills | Status: DC

## 2020-11-28 MED ORDER — METOPROLOL SUCCINATE ER 25 MG PO TB24: ORAL_TABLET | ORAL | 3 refills | Status: DC

## 2020-11-28 NOTE — Progress Notes (Signed)
Office Note 11/28/2020  CC:  Chief Complaint  Patient presents with   Annual Exam    Pt is not fasting, pt reports #2 shingles completed [facility].   Follow-up    hypertension    HPI:  Gabrielle [name] is a 60 y.o. White female who is here for annual health maintenance exam and 3 mo f/u HTN, bipolar d/o, anxiety, and insomnia. A/P as of last visit: "1) HTN: good control on 12.'5mg'$  toprol xl qd.   2) HLD: pt with CAD, hx of stent placement.   LDL was at goal of <70 three mo ago after zetia was added to her atorva '80mg'$  qd. Plan rpt flp and hepatic panel 3 mo.   3) Bipolar II + GAD and anxiety-related insomnia-->stable on lamictal '200mg'$  qd, paxil '40mg'$  qd, and 0.'5mg'$  alpraz 0.'5mg'$  tid prn.  She does occasionally take an ambien hs severe insomnia but once again I warned her of potential for oversedation and resp depression when taking this on same night as xanax.  She expressed understanding and states she feels no adverse effects when she does this. CSC UTD. No new rx for xanax or Lorrin Mais were done today.   4) Hx of vit B12 deficiency: pt states she was dx'd with this in remote past and was on b12 injections at one point in time.  Currently taking oral b12 supp.  She requests B12 level check at the time of next f/u in 3 mo."  INTERIM HX: Feeling pretty well.   Recent oncol f/u 10/31/20, recently found to have residual/progressive lymphoma. She is asymptomatic.  Obs and serial radiologic surveillance is the plan. Dr. Benay Spice also wanted B12 checked b/c pt has elevated MCV.  Mood and anxiety levels stable.  Sleep fairly good most nights. Taking alpraz 1 qd every day. Seldom needs ambien. PMP AWARE reviewed today: most recent rx for alprazolam 0.'5mg'$  was filled 10/21/20, # 38, rx by me. Most recent rx for ambien '10mg'$  was filled 02/28/20, # 57, rx by me. No red flags.  HTN: home bp consistently <130/80. Tolerating both her zetia and atorvastatin w/out any side effects. Diet is  improving, she is active but no formal exercise habits.  Past Medical History:  Diagnosis Date   Anxiety and depression    CAD (coronary artery disease)    MI + stent 2018   Colon cancer screening 04/2019   Cologuard NEG.  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   Dyspepsia    GERD (gastroesophageal reflux disease)    History of myocardial infarction 2018   Hx of migraines    remote past   Hypercholesterolemia    NHL (non-Hodgkin's lymphoma) ([place]) 2016   left peritoneal mesonteric mass   Palpitations 2015   no monitoring was done-->resolved spontaneously    Past Surgical History:  Procedure Laterality Date   biopsy  2016   Initial bx needle; then lab abd (done in [facility]) done to get further bx, exp lap--complications--sepsis/hemorrhage--memory/cognitive difficulties since that surgery/hospitalization--details somewhat cloudy from pt's info.   BREAST ENHANCEMENT SURGERY  2003   Heart Stent  2018   Prox LAD ([place]).   INCISIONAL HERNIA REPAIR  2017   w/mesh   TONSILLECTOMY     TRANSTHORACIC ECHOCARDIOGRAM  09/2013   2015 ALL NORMAL.  2020 normal per pt report (Drr. Metta Clines with [facility] [place]   WISDOM TOOTH EXTRACTION      Family History  Problem Relation Age of Onset   Heart disease Mother    Emphysema Mother    Hypertension Mother    Stroke Mother    Depression Mother    Heart failure Mother    Hypertension Brother    Depression Brother    Stroke Maternal Aunt    Depression Maternal Aunt    CAD Maternal Aunt    Colon cancer Other     Social History   Socioeconomic History   Marital status: Married    Spouse name: Not on file   Number of children: 0   Years of education: Not on file   Highest education level: Not on file  Occupational History   Occupation: Disability  Tobacco Use   Smoking status: Never   Smokeless tobacco: Never  Vaping Use   Vaping Use: Never  used  Substance and Sexual Activity   Alcohol use: No   Drug use: No   Sexual activity: Yes    Birth control/protection: Condom  Other Topics Concern   Not on file  Social History Narrative   Married, no children.   Relocated from [place] about 10/2018.   Educ: BA [employer], grad w/honors.   [employer] and others prior to disability.   Disabled due to bipolar, depression 2012.   No tob, alc.   No drug use.   Social Determinants of Health   Financial Resource Strain: Low Risk    Difficulty of Paying Living Expenses: Not hard at all  Food Insecurity: No Food Insecurity   Worried About Charity fundraiser in the Last Year: Never true   [place] in the Last Year: Never true  Transportation Needs: No Transportation Needs   Lack of Transportation (Medical): No   Lack of Transportation (Non-Medical): No  Physical Activity: Inactive   Days of Exercise per Week: 0 days   Minutes of Exercise per Session: 0 min  Stress: No Stress Concern Present   Feeling of Stress : Not at all  Social Connections: Moderately Isolated   Frequency of Communication with Friends and Family: More than three times a week   Frequency of Social Gatherings with Friends and Family: Once a week   Attends Religious Services: Never   Marine scientist or Organizations: No   Attends Music therapist: Never   Marital Status: Married  Human resources officer Violence: Not At Risk   Fear of Current or Ex-Partner: No   Emotionally Abused: No   Physically Abused: No   Sexually Abused: No    Outpatient Medications Prior to Visit  Medication Sig Dispense Refill   ALPRAZolam (XANAX) 0.5 MG tablet Take 1 tablet (0.5 mg total) by mouth 3 (three) times daily. 270 tablet 1   aspirin EC 81 MG tablet Take by mouth.     atorvastatin (LIPITOR) 80 MG tablet TAKE 1 TABLET EVERY DAY 90 tablet 0   ezetimibe (ZETIA) 10 MG tablet Take 1 tablet (10 mg total) by mouth daily. 90 tablet 3   lamoTRIgine  (LAMICTAL) 200 MG tablet TAKE 1 TABLET (200 MG TOTAL) BY MOUTH DAILY. 90 tablet 3   Multiple Vitamins-Minerals (MULTIVITAMIN ADULT PO) Take by mouth daily.     omeprazole (PRILOSEC) 40 MG capsule Take 1 capsule (40 mg total) by mouth daily. 90 capsule 3   metoprolol succinate (TOPROL-XL) 25 MG 24 hr tablet TAKE 1/2 TABLET ONE TIME DAILY 45 tablet 1   PARoxetine (PAXIL) 40 MG tablet Take 1 tablet (  40 mg total) by mouth daily. 90 tablet 1   zolpidem (AMBIEN) 10 MG tablet Take 1 tablet (10 mg total) by mouth at bedtime as needed for sleep. 90 tablet 1   ondansetron (ZOFRAN ODT) 8 MG disintegrating tablet Take 1 tablet (8 mg total) by mouth every 8 (eight) hours as needed for nausea. (Patient not taking: No sig reported) 20 tablet 0   oxyCODONE-acetaminophen (PERCOCET/ROXICET) 5-325 MG tablet Take 1 tablet by mouth every 8 (eight) hours as needed for severe pain. (Patient not taking: No sig reported) 8 tablet 0   No facility-administered medications prior to visit.    Allergies  Allergen Reactions   Sulfa Antibiotics Rash   ROS Review of Systems  Constitutional:  Negative for appetite change, chills, fatigue and fever.  HENT:  Negative for congestion, dental problem, ear pain and sore throat.   Eyes:  Negative for discharge, redness and visual disturbance.  Respiratory:  Negative for cough, chest tightness, shortness of breath and wheezing.   Cardiovascular:  Negative for chest pain, palpitations and leg swelling.  Gastrointestinal:  Negative for abdominal pain, blood in stool, diarrhea, nausea and vomiting.  Genitourinary:  Negative for difficulty urinating, dysuria, flank pain, frequency, hematuria and urgency.  Musculoskeletal:  Negative for arthralgias, back pain, joint swelling, myalgias and neck stiffness.  Skin:  Negative for pallor and rash.  Neurological:  Negative for dizziness, speech difficulty, weakness and headaches.  Hematological:  Negative for adenopathy. Does not  bruise/bleed easily.  Psychiatric/Behavioral:  Negative for confusion and sleep disturbance. The patient is not nervous/anxious.    PE; Vitals with BMI 11/28/2020 10/31/2020 10/16/2020  Height '5\' 3"'$  '5\' 4"'$  -  Weight 140 lbs 6 oz 139 lbs 3 oz -  BMI [id number] -  Systolic [id number]  Diastolic 76 72 54  Pulse 73 66 68   Exam chaperoned by Deveron Furlong, CMA.  Gen: Alert, well appearing.  Patient is oriented to person, place, time, and situation. AFFECT: pleasant, lucid thought and speech. ENT: Ears: EACs clear, normal epithelium.  TMs with good light reflex and landmarks bilaterally.  Eyes: no injection, icteris, swelling, or exudate.  EOMI, PERRLA. Nose: no drainage or turbinate edema/swelling.  No injection or focal lesion.  Mouth: lips without lesion/swelling.  Oral mucosa pink and moist.  Dentition intact and without obvious caries or gingival swelling.  Oropharynx without erythema, exudate, or swelling.  Neck: supple/nontender.  No LAD, mass, or TM.  Carotid pulses 2+ bilaterally, without bruits. CV: RRR, no m/r/g.   LUNGS: CTA bilat, nonlabored resps, good aeration in all lung fields. ABD: soft, NT, ND, BS normal.  No hepatospenomegaly or mass.  No bruits. EXT: no clubbing, cyanosis, or edema.  Musculoskeletal: no joint swelling, erythema, warmth, or tenderness.  ROM of all joints intact. Skin - no sores or suspicious lesions or rashes or color changes  Pertinent labs:  Lab Results  Component Value Date   TSH 1.60 05/28/2014   Lab Results  Component Value Date   WBC 6.2 10/16/2020   HGB 13.1 10/16/2020   HCT 39.5 10/16/2020   MCV 100.8 (H) 10/16/2020   PLT 165 10/16/2020   Lab Results  Component Value Date   CREATININE 0.67 10/16/2020   BUN 16 10/16/2020   NA 142 10/16/2020   K 3.6 10/16/2020   CL 106 10/16/2020   CO2 29 10/16/2020   Lab Results  Component Value Date   ALT 23 10/16/2020   AST 22 10/16/2020   ALKPHOS  105 10/16/2020   BILITOT 0.6 10/16/2020    Lab Results  Component Value Date   CHOL 127 05/29/2020   Lab Results  Component Value Date   HDL 50.70 05/29/2020   Lab Results  Component Value Date   LDLCALC 63 05/29/2020   Lab Results  Component Value Date   TRIG 69.0 05/29/2020   Lab Results  Component Value Date   CHOLHDL 3 05/29/2020   ASSESSMENT AND PLAN:   1) HTN: well controlled on 1/2 '25mg'$  toprol xl qd. Lytes/cr ordered.  2) HLD: atorva 80 qd and zetia 10 qd. LDL goal <70. FLP and hepatic panel ordered.  3) Hx of vit B12 def: per pt report. Elevated MCV, no recent hx of anemia. Vit B12 level ordered.  4) Bipolar d/o, anxiety, insomnia-->high risk med use. CSC UTD. UDS today.  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  Obs + serial imaging is the plan per oncol. Will forward Dr. Benay Spice her lab results.  6) Health maintenance exam: Reviewed age and gender appropriate health maintenance issues (prudent diet, regular exercise, health risks of tobacco and excessive alcohol, use of seatbelts, fire alarms in home, use of sunscreen).  Also reviewed age and gender appropriate health screening as well as vaccine recommendations. Vaccines: Shingrix #2-->she's not sure if she got #2 at pharmacy or not, so we'll check.  No record of Tdap->she declines.  Labs:recent cbc and cmet normal in ED on 10/16/20.  HP labs, B12, LDH->she'll get in future at [facility] lab when fasting. Cervical ca screening: as per Dr. Louretta Shorten, GYN MD. Breast ca screening: 02/2019 is last mammogram, normal per pt.  She gets this via her GYN office. Colon ca screening: cologuard repeat due 04/2022.  An After Visit Summary was printed and given to the patient.  FOLLOW UP:  Return in about 6 months (around 05/31/2021) for routine chronic illness f/u.  Signed:  Crissie Sickles, MD           11/28/2020

## 2020-11-28 NOTE — Patient Instructions (Signed)
Health Maintenance, Female Adopting a healthy lifestyle and getting preventive care are important in promoting health and wellness. Ask your health care provider about: The right schedule for you to have regular tests and exams. Things you can do on your own to prevent diseases and keep yourself healthy. What should I know about diet, weight, and exercise? Eat a healthy diet  Eat a diet that includes plenty of vegetables, fruits, low-fat dairy products, and lean protein. Do not eat a lot of foods that are high in solid fats, added sugars, or sodium.  Maintain a healthy weight Body mass index (BMI) is used to identify weight problems. It estimates body fat based on height and weight. Your health care provider can help determineyour BMI and help you achieve or maintain a healthy weight. Get regular exercise Get regular exercise. This is one of the most important things you can do for your health. Most adults should: Exercise for at least 150 minutes each week. The exercise should increase your heart rate and make you sweat (moderate-intensity exercise). Do strengthening exercises at least twice a week. This is in addition to the moderate-intensity exercise. Spend less time sitting. Even light physical activity can be beneficial. Watch cholesterol and blood lipids Have your blood tested for lipids and cholesterol at 60 years of age, then havethis test every 5 years. Have your cholesterol levels checked more often if: Your lipid or cholesterol levels are high. You are older than 60 years of age. You are at high risk for heart disease. What should I know about cancer screening? Depending on your health history and family history, you may need to have cancer screening at various ages. This may include screening for: Breast cancer. Cervical cancer. Colorectal cancer. Skin cancer. Lung cancer. What should I know about heart disease, diabetes, and high blood pressure? Blood pressure and heart  disease High blood pressure causes heart disease and increases the risk of stroke. This is more likely to develop in people who have high blood pressure readings, are of African descent, or are overweight. Have your blood pressure checked: Every 3-5 years if you are 18-39 years of age. Every year if you are 40 years old or older. Diabetes Have regular diabetes screenings. This checks your fasting blood sugar level. Have the screening done: Once every three years after age 40 if you are at a normal weight and have a low risk for diabetes. More often and at a younger age if you are overweight or have a high risk for diabetes. What should I know about preventing infection? Hepatitis B If you have a higher risk for hepatitis B, you should be screened for this virus. Talk with your health care provider to find out if you are at risk forhepatitis B infection. Hepatitis C Testing is recommended for: Everyone born from 1945 through 1965. Anyone with known risk factors for hepatitis C. Sexually transmitted infections (STIs) Get screened for STIs, including gonorrhea and chlamydia, if: You are sexually active and are younger than 60 years of age. You are older than 60 years of age and your health care provider tells you that you are at risk for this type of infection. Your sexual activity has changed since you were last screened, and you are at increased risk for chlamydia or gonorrhea. Ask your health care provider if you are at risk. Ask your health care provider about whether you are at high risk for HIV. Your health care provider may recommend a prescription medicine to help   prevent HIV infection. If you choose to take medicine to prevent HIV, you should first get tested for HIV. You should then be tested every 3 months for as long as you are taking the medicine. Pregnancy If you are about to stop having your period (premenopausal) and you may become pregnant, seek counseling before you get  pregnant. Take 400 to 800 micrograms (mcg) of folic acid every day if you become pregnant. Ask for birth control (contraception) if you want to prevent pregnancy. Osteoporosis and menopause Osteoporosis is a disease in which the bones lose minerals and strength with aging. This can result in bone fractures. If you are 65 years old or older, or if you are at risk for osteoporosis and fractures, ask your health care provider if you should: Be screened for bone loss. Take a calcium or vitamin D supplement to lower your risk of fractures. Be given hormone replacement therapy (HRT) to treat symptoms of menopause. Follow these instructions at home: Lifestyle Do not use any products that contain nicotine or tobacco, such as cigarettes, e-cigarettes, and chewing tobacco. If you need help quitting, ask your health care provider. Do not use street drugs. Do not share needles. Ask your health care provider for help if you need support or information about quitting drugs. Alcohol use Do not drink alcohol if: Your health care provider tells you not to drink. You are pregnant, may be pregnant, or are planning to become pregnant. If you drink alcohol: Limit how much you use to 0-1 drink a day. Limit intake if you are breastfeeding. Be aware of how much alcohol is in your drink. In the [place] one drink equals one 12 oz bottle of beer (355 mL), one 5 oz glass of wine (148 mL), or one 1 oz glass of hard liquor (44 mL). General instructions Schedule regular health, dental, and eye exams. Stay current with your vaccines. Tell your health care provider if: You often feel depressed. You have ever been abused or do not feel safe at home. Summary Adopting a healthy lifestyle and getting preventive care are important in promoting health and wellness. Follow your health care provider's instructions about healthy diet, exercising, and getting tested or screened for diseases. Follow your health care provider's  instructions on monitoring your cholesterol and blood pressure. This information is not intended to replace advice given to you by your health care provider. Make sure you discuss any questions you have with your healthcare provider. Document Revised: 04/05/2018 Document Reviewed: 04/05/2018 Elsevier Patient Education  2022 Elsevier Inc.  

## 2021-02-22 ENCOUNTER — Other Ambulatory Visit: Payer: Self-pay | Admitting: Family Medicine

## 2021-03-03 ENCOUNTER — Inpatient Hospital Stay: Payer: Medicare HMO | Attending: Oncology

## 2021-03-03 ENCOUNTER — Inpatient Hospital Stay: Payer: Medicare HMO | Admitting: Oncology

## 2021-03-03 ENCOUNTER — Other Ambulatory Visit: Payer: Self-pay

## 2021-03-03 VITALS — BP 115/64 | HR 74 | Temp 98.7°F | Resp 18 | Ht 63.0 in | Wt 143.0 lb

## 2021-03-03 DIAGNOSIS — C8580 Other specified types of non-Hodgkin lymphoma, unspecified site: Secondary | ICD-10-CM

## 2021-03-03 DIAGNOSIS — F419 Anxiety disorder, unspecified: Secondary | ICD-10-CM | POA: Insufficient documentation

## 2021-03-03 DIAGNOSIS — E785 Hyperlipidemia, unspecified: Secondary | ICD-10-CM | POA: Diagnosis not present

## 2021-03-03 DIAGNOSIS — Z8572 Personal history of non-Hodgkin lymphomas: Secondary | ICD-10-CM | POA: Insufficient documentation

## 2021-03-03 DIAGNOSIS — F32A Depression, unspecified: Secondary | ICD-10-CM | POA: Insufficient documentation

## 2021-03-03 DIAGNOSIS — Z79899 Other long term (current) drug therapy: Secondary | ICD-10-CM | POA: Diagnosis not present

## 2021-03-03 LAB — CBC WITH DIFFERENTIAL (CANCER CENTER ONLY)
Abs Immature Granulocytes: 0.01 10*3/uL (ref 0.00–0.07)
Basophils Absolute: 0 10*3/uL (ref 0.0–0.1)
Basophils Relative: 0 %
Eosinophils Absolute: 0.1 10*3/uL (ref 0.0–0.5)
Eosinophils Relative: 2 %
HCT: 41.2 % (ref 36.0–46.0)
Hemoglobin: 13.6 g/dL (ref 12.0–15.0)
Immature Granulocytes: 0 %
Lymphocytes Relative: 36 %
Lymphs Abs: 2.2 10*3/uL (ref 0.7–4.0)
MCH: 32.5 pg (ref 26.0–34.0)
MCHC: 33 g/dL (ref 30.0–36.0)
MCV: 98.6 fL (ref 80.0–100.0)
Monocytes Absolute: 0.4 10*3/uL (ref 0.1–1.0)
Monocytes Relative: 6 %
Neutro Abs: 3.4 10*3/uL (ref 1.7–7.7)
Neutrophils Relative %: 56 %
Platelet Count: 192 10*3/uL (ref 150–400)
RBC: 4.18 MIL/uL (ref 3.87–5.11)
RDW: 12.5 % (ref 11.5–15.5)
WBC Count: 6.1 10*3/uL (ref 4.0–10.5)
nRBC: 0 % (ref 0.0–0.2)

## 2021-03-03 LAB — CMP (CANCER CENTER ONLY)
ALT: 29 U/L (ref 0–44)
AST: 27 U/L (ref 15–41)
Albumin: 4.4 g/dL (ref 3.5–5.0)
Alkaline Phosphatase: 109 U/L (ref 38–126)
Anion gap: 6 (ref 5–15)
BUN: 17 mg/dL (ref 6–20)
CO2: 28 mmol/L (ref 22–32)
Calcium: 9.3 mg/dL (ref 8.9–10.3)
Chloride: 104 mmol/L (ref 98–111)
Creatinine: 0.74 mg/dL (ref 0.44–1.00)
GFR, Estimated: >60 mL/min (ref >60)
Glucose, Bld: 97 mg/dL (ref 70–99)
Potassium: 4.4 mmol/L (ref 3.5–5.1)
Sodium: 138 mmol/L (ref 135–145)
Total Bilirubin: 0.7 mg/dL (ref 0.3–1.2)
Total Protein: 6.4 g/dL — ABNORMAL LOW (ref 6.5–8.1)

## 2021-03-03 LAB — VITAMIN B12: Vitamin B-12: 1462 pg/mL — ABNORMAL HIGH (ref 180–914)

## 2021-03-03 LAB — LACTATE DEHYDROGENASE: LDH: 152 U/L (ref 98–192)

## 2021-03-03 NOTE — Progress Notes (Signed)
[place] [facility] OFFICE PROGRESS NOTE   Diagnosis: Non-Hodgkin's Ph[place]  INTERVAL HISTORY:   Gabrielle [name] returns as scheduled.  She feels well.  Good appetite.  No fever or night sweats.  No palpable lymph nodes.  No abdominal pain.  Objective:  Vital signs in last 24 hours:  Blood pressure 115/64, pulse 74, temperature 98.7 F (37.1 C), temperature source Oral, resp. rate 18, height '5\' 3"'  (1.6 m), weight 143 lb (64.9 kg), last menstrual period 10/20/2011, SpO2 98 %.    Lymphatics: No cervical, supraclavicular, axillary, or inguinal nodes Resp: Lungs clear bilaterally Cardio: Regular rate and rhythm GI: No mass, no hepatosplenomegaly, nontender Vascular: No leg edema   Lab Results:  Lab Results  Component Value Date   WBC 6.1 03/03/2021   HGB 13.6 03/03/2021   HCT 41.2 03/03/2021   MCV 98.6 03/03/2021   PLT 192 03/03/2021   NEUTROABS 3.4 03/03/2021    CMP  Lab Results  Component Value Date   NA 138 03/03/2021   K 4.4 03/03/2021   CL 104 03/03/2021   CO2 28 03/03/2021   GLUCOSE 97 03/03/2021   BUN 17 03/03/2021   CREATININE 0.74 03/03/2021   CALCIUM 9.3 03/03/2021   PROT 6.4 (L) 03/03/2021   ALBUMIN 4.4 03/03/2021   AST 27 03/03/2021   ALT 29 03/03/2021   ALKPHOS 109 03/03/2021   BILITOT 0.7 03/03/2021   GFRNONAA >60 03/03/2021   GFRAA  06/20/2010    >60        The eGFR has been calculated using the MDRD equation. This calculation has not been validated in all clinical situations. eGFR's persistently <60 mL/min signify possible Chronic Kidney Disease.    Medications: I have reviewed the patient's current medications.   Assessment/Plan: Non-Hodgkin lymphoma, B-cell follicular center cell type CT abdomen/pelvis 05/29/2014-left abdominal intraperitoneal mass, subcentimeter liver lesions felt to be benign, no adenopathy CT-guided biopsy of left abdominal mass 06/04/2014-non-Hodgkin B-cell lymphoma, follicular center cell type, high-grade favored,  abundance of large lymphocytes admixed with small angulated lymphocytes Relocated to [facility], underwent an open diagnostic biopsy 07/16/2014-pathology from mesenteric lymph node biopsies revealed sclerosing follicular B-cell lymphoma, CD20 positive, CD10 positive, predominantly diffuse architecture, low-grade.  Flow cytometry revealed a monoclonal B-cell population, lambda light chain restricted, 12.9% large cells, c-Myc rearrangement not detected, KI-67 20% She was not treated for lymphoma  PET 12/12/2014-markedly decreased size of hypermetabolic mesenteric mass, measuring 2.3 x 3.8 cm compared to 4.8 x 7.9 cm with SUV 7.2 compared to 31.5 hypermetabolic activity in the region of the retroperitoneal reflection no longer evident, hypermetabolic soft tissue track extending from the bladder dome to the periumbilical region PET 12/28/5857-[id number] noted hypermetabolic mesenteric mass no longer visualized, no hypermetabolic lymphadenopathy or extranodal disease, midline subincisional fluid collection measuring 3 x 6 cm CT abdomen/pelvis 10/16/2020-compared to 05/29/2014 CT, no change in left lateral liver hypodensity, abnormal mesenteric adenopathy in the left abdomen at the site of a previous mesenteric mass, representative node measuring 15 mm, majority of nodes are 5-10 mm, no retroperitoneal or upper abdominal adenopathy, no pelvic sidewall or inguinal adenopathy 2.  coronary artery disease 3.  Anxiety and depression 4.  Hyperlipidemia     Disposition: Gabrielle [name] appears stable.  There is no clinical evidence of progressive lymphoma.  Her clinical presentation is consistent with a low-grade lymphoma.  She will call for new symptoms.  She will return for an office visit in 6 months.  We will consider repeat imaging in June  2023Betsy Coder, MD  03/03/2021  2:56 PM

## 2021-04-12 ENCOUNTER — Other Ambulatory Visit: Payer: Self-pay | Admitting: Family Medicine

## 2021-04-13 DIAGNOSIS — H524 Presbyopia: Secondary | ICD-10-CM | POA: Diagnosis not present

## 2021-04-13 DIAGNOSIS — Z0100 Encounter for examination of eyes and vision without abnormal findings: Secondary | ICD-10-CM | POA: Diagnosis not present

## 2021-04-13 NOTE — Telephone Encounter (Signed)
Requesting:  alprazolam Contract: 02/27/20 UDS: 11/28/20 Last Visit: 11/28/20 Next Visit: 06/01/21 Last Refill: 09/01/20(270,1)  Please Advise. Medication pending

## 2021-05-06 ENCOUNTER — Other Ambulatory Visit: Payer: Self-pay | Admitting: Family Medicine

## 2021-05-25 DIAGNOSIS — Z7982 Long term (current) use of aspirin: Secondary | ICD-10-CM | POA: Diagnosis not present

## 2021-05-25 DIAGNOSIS — Z955 Presence of coronary angioplasty implant and graft: Secondary | ICD-10-CM | POA: Diagnosis not present

## 2021-05-25 DIAGNOSIS — E785 Hyperlipidemia, unspecified: Secondary | ICD-10-CM | POA: Diagnosis not present

## 2021-05-25 DIAGNOSIS — I2510 Atherosclerotic heart disease of native coronary artery without angina pectoris: Secondary | ICD-10-CM | POA: Diagnosis not present

## 2021-05-25 DIAGNOSIS — Z79899 Other long term (current) drug therapy: Secondary | ICD-10-CM | POA: Diagnosis not present

## 2021-05-25 DIAGNOSIS — R0789 Other chest pain: Secondary | ICD-10-CM | POA: Diagnosis not present

## 2021-05-25 DIAGNOSIS — I252 Old myocardial infarction: Secondary | ICD-10-CM | POA: Diagnosis not present

## 2021-06-01 ENCOUNTER — Ambulatory Visit: Payer: Medicare HMO | Admitting: Family Medicine

## 2021-06-01 NOTE — Progress Notes (Deleted)
OFFICE VISIT  06/01/2021  CC: No chief complaint on file.   Patient is a 61 y.o. female who presents for 6 mo f/u HTN, HLD, bipolar disorder, anxiety, and insomnia (high risk med use). A/P as of last visit: "1) HTN: well controlled on 1/2 25mg  toprol xl qd. Lytes/cr ordered.   2) HLD: atorva 80 qd and zetia 10 qd. LDL goal <70. FLP and hepatic panel ordered.   3) Hx of vit B12 def: per pt report. Elevated MCV, no recent hx of anemia. Vit B12 level ordered.   4) Bipolar d/o, anxiety, insomnia-->high risk med use. CSC UTD. UDS today.   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  Obs + serial imaging is the plan per oncol. Will forward Dr. Benay Spice her lab results.   6) Health maintenance exam: Reviewed age and gender appropriate health maintenance issues (prudent diet, regular exercise, health risks of tobacco and excessive alcohol, use of seatbelts, fire alarms in home, use of sunscreen).  Also reviewed age and gender appropriate health screening as well as vaccine recommendations. Vaccines: Shingrix #2-->she's not sure if she got #2 at pharmacy or not, so we'll check.  No record of Tdap->she declines.  Labs:recent cbc and cmet normal in ED on 10/16/20.  HP labs, B12, LDH->she'll get in future at [facility] lab when fasting. Cervical ca screening: as per Dr. Louretta Shorten, GYN MD. Breast ca screening: 02/2019 is last mammogram, normal per pt.  She gets this via her GYN office. Colon ca screening: cologuard repeat due 04/2022.  INTERIM HX: Gabrielle [name] did not get the labs that I had ordered at her last visit with me in August. She has a diagnosis of low-grade lymphoma.  --Observation is her current mode of treatment.  At her oncologist's office on 03/03/2021 her B12 was 1462.  CBC with differential, complete metabolic panel, and LDH were all normal at that time as well. At oncology follow-up at that time it was determined that she had no  evidence of progression of her lymphoma.  Repeat imaging to be considered June 2023.  She had cardiology follow-up 05/25/2021.  Was having some atypical chest pain and they felt like no further work-up was needed. She remained on beta-blocker, statin, Zetia, and aspirin for secondary prevention of coronary artery disease.   PMP AWARE reviewed today: most recent rx for alprazolam was filled 04/15/21, # 44, rx by me. No red flags.  Past Medical History:  Diagnosis Date   Anxiety and depression    CAD (coronary artery disease)    MI + stent 2018   Colon cancer screening 04/2019   Cologuard NEG.  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   NHL-L peritoneal mesenteric mass-->residual/progressive dz 2022   Dyspepsia    GERD (gastroesophageal reflux disease)    History of myocardial infarction 2018   Hx of migraines    remote past   Hypercholesterolemia    NHL (non-Hodgkin's lymphoma) ([place]) 2016   left peritoneal mesonteric mass   Palpitations 2015   no monitoring was done-->resolved spontaneously    Past Surgical History:  Procedure Laterality Date   biopsy  2016   Initial bx needle; then lab abd (done in [facility]) done to get further bx, exp lap--complications--sepsis/hemorrhage--memory/cognitive difficulties since that surgery/hospitalization--details somewhat cloudy from pt's info.   BREAST ENHANCEMENT SURGERY  2003   Heart Stent  2018   Prox LAD (murrells inlet).   [place]  2017  w/mesh   TONSILLECTOMY     TRANSTHORACIC ECHOCARDIOGRAM  09/2013   2015 ALL NORMAL.  2020 normal per pt report (Drr. Metta Clines with [facility] [place]   WISDOM TOOTH EXTRACTION      Outpatient Medications Prior to Visit  Medication Sig Dispense Refill   ALPRAZolam (XANAX) 0.5 MG tablet TAKE 1 TABLET THREE TIMES DAILY 90 tablet 5   aspirin EC 81 MG tablet Take by mouth.     atorvastatin (LIPITOR) 80 MG tablet TAKE 1 TABLET EVERY DAY 90 tablet 0   ezetimibe (ZETIA) 10 MG  tablet Take 1 tablet (10 mg total) by mouth daily. 90 tablet 3   lamoTRIgine (LAMICTAL) 200 MG tablet TAKE 1 TABLET EVERY DAY 90 tablet 0   metoprolol succinate (TOPROL-XL) 25 MG 24 hr tablet TAKE 1/2 TABLET ONE TIME DAILY 45 tablet 3   Multiple Vitamins-Minerals (MULTIVITAMIN ADULT PO) Take by mouth daily.     omeprazole (PRILOSEC) 40 MG capsule Take 1 capsule (40 mg total) by mouth daily. 90 capsule 3   PARoxetine (PAXIL) 40 MG tablet Take 1 tablet (40 mg total) by mouth daily. 90 tablet 3   zolpidem (AMBIEN) 10 MG tablet Take 1 tablet (10 mg total) by mouth at bedtime as needed for sleep. 90 tablet 1   No facility-administered medications prior to visit.    Allergies  Allergen Reactions   Sulfa Antibiotics Rash    ROS As per HPI  PE: Vitals with BMI 03/03/2021 11/28/2020 10/31/2020  Height 5\' 3"  5\' 3"  5\' 4"   Weight 143 lbs 140 lbs 6 oz 139 lbs 3 oz  BMI 25.34 40.98 11.91  Systolic 478 295 621  Diastolic 64 76 72  Pulse 74 73 66     Physical Exam  ***  LABS:  Last CBC Lab Results  Component Value Date   WBC 6.1 03/03/2021   HGB 13.6 03/03/2021   HCT 41.2 03/03/2021   MCV 98.6 03/03/2021   MCH 32.5 03/03/2021   RDW 12.5 03/03/2021   PLT 192 30/86/5784   Last metabolic panel Lab Results  Component Value Date   GLUCOSE 97 03/03/2021   NA 138 03/03/2021   K 4.4 03/03/2021   CL 104 03/03/2021   CO2 28 03/03/2021   BUN 17 03/03/2021   CREATININE 0.74 03/03/2021   GFRNONAA >60 03/03/2021   CALCIUM 9.3 03/03/2021   PROT 6.4 (L) 03/03/2021   ALBUMIN 4.4 03/03/2021   BILITOT 0.7 03/03/2021   ALKPHOS 109 03/03/2021   AST 27 03/03/2021   ALT 29 03/03/2021   ANIONGAP 6 03/03/2021   Last lipids Lab Results  Component Value Date   CHOL 127 05/29/2020   HDL 50.70 05/29/2020   LDLCALC 63 05/29/2020   TRIG 69.0 05/29/2020   CHOLHDL 3 05/29/2020   Last hemoglobin A1c No results found for: HGBA1C Last thyroid functions Lab Results  Component Value Date   TSH  1.60 05/28/2014   Last vitamin B12 and Folate Lab Results  Component Value Date   VITAMINB12 1,462 (H) 03/03/2021   IMPRESSION AND PLAN:  No problem-specific Assessment & Plan notes found for this encounter.  ? Lipid panel if fasting.  An After Visit Summary was printed and given to the patient.  FOLLOW UP: No follow-ups on file.  Signed:  Crissie Sickles, MD           06/01/2021

## 2021-06-05 ENCOUNTER — Ambulatory Visit: Payer: Medicare HMO | Admitting: Family Medicine

## 2021-06-17 DIAGNOSIS — Z124 Encounter for screening for malignant neoplasm of cervix: Secondary | ICD-10-CM | POA: Diagnosis not present

## 2021-06-17 DIAGNOSIS — Z6826 Body mass index (BMI) 26.0-26.9, adult: Secondary | ICD-10-CM | POA: Diagnosis not present

## 2021-06-17 DIAGNOSIS — R319 Hematuria, unspecified: Secondary | ICD-10-CM | POA: Diagnosis not present

## 2021-06-17 DIAGNOSIS — Z01419 Encounter for gynecological examination (general) (routine) without abnormal findings: Secondary | ICD-10-CM | POA: Diagnosis not present

## 2021-06-17 DIAGNOSIS — R300 Dysuria: Secondary | ICD-10-CM | POA: Diagnosis not present

## 2021-06-18 ENCOUNTER — Other Ambulatory Visit: Payer: Self-pay

## 2021-06-18 NOTE — Progress Notes (Signed)
Future labs in EPIC

## 2021-06-19 ENCOUNTER — Encounter: Payer: Self-pay | Admitting: Family Medicine

## 2021-06-19 ENCOUNTER — Ambulatory Visit (INDEPENDENT_AMBULATORY_CARE_PROVIDER_SITE_OTHER): Payer: Medicare HMO | Admitting: Family Medicine

## 2021-06-19 VITALS — BP 115/71 | HR 67 | Temp 98.1°F | Ht 63.0 in | Wt 142.2 lb

## 2021-06-19 DIAGNOSIS — I10 Essential (primary) hypertension: Secondary | ICD-10-CM | POA: Diagnosis not present

## 2021-06-19 DIAGNOSIS — F5104 Psychophysiologic insomnia: Secondary | ICD-10-CM | POA: Diagnosis not present

## 2021-06-19 DIAGNOSIS — E7800 Pure hypercholesterolemia, unspecified: Secondary | ICD-10-CM

## 2021-06-19 DIAGNOSIS — F411 Generalized anxiety disorder: Secondary | ICD-10-CM

## 2021-06-19 DIAGNOSIS — Z79899 Other long term (current) drug therapy: Secondary | ICD-10-CM | POA: Diagnosis not present

## 2021-06-19 DIAGNOSIS — F3131 Bipolar disorder, current episode depressed, mild: Secondary | ICD-10-CM | POA: Diagnosis not present

## 2021-06-19 LAB — COMPREHENSIVE METABOLIC PANEL
ALT: 39 U/L — ABNORMAL HIGH (ref 0–35)
AST: 35 U/L (ref 0–37)
Albumin: 4.4 g/dL (ref 3.5–5.2)
Alkaline Phosphatase: 105 U/L (ref 39–117)
BUN: 14 mg/dL (ref 6–23)
CO2: 35 mEq/L — ABNORMAL HIGH (ref 19–32)
Calcium: 9.8 mg/dL (ref 8.4–10.5)
Chloride: 101 mEq/L (ref 96–112)
Creatinine, Ser: 0.72 mg/dL (ref 0.40–1.20)
GFR: 90.58 mL/min (ref >60.00)
Glucose, Bld: 79 mg/dL (ref 70–99)
Potassium: 4.3 mEq/L (ref 3.5–5.1)
Sodium: 137 mEq/L (ref 135–145)
Total Bilirubin: 0.8 mg/dL (ref 0.2–1.2)
Total Protein: 6.2 g/dL (ref 6.0–8.3)

## 2021-06-19 LAB — LIPID PANEL
Cholesterol: 144 mg/dL (ref 0–200)
HDL: 50.7 mg/dL (ref >39.00)
LDL Cholesterol: 69 mg/dL (ref 0–99)
NonHDL: 93.04
Total CHOL/HDL Ratio: 3
Triglycerides: 120 mg/dL (ref 0.0–149.0)
VLDL: 24 mg/dL (ref 0.0–40.0)

## 2021-06-19 LAB — HM PAP SMEAR: HM Pap smear: NORMAL

## 2021-06-19 MED ORDER — EZETIMIBE 10 MG PO TABS: 10.0000 mg | ORAL_TABLET | Freq: Every day | ORAL | 3 refills | Status: AC

## 2021-06-19 MED ORDER — LAMOTRIGINE ER 300 MG PO TB24
ORAL_TABLET | ORAL | 1 refills | Status: DC
Start: 2021-06-19 — End: 2021-12-04

## 2021-06-19 MED ORDER — OMEPRAZOLE 40 MG PO CPDR: 40.0000 mg | DELAYED_RELEASE_CAPSULE | Freq: Every day | ORAL | 3 refills | Status: DC

## 2021-06-19 MED ORDER — ZOLPIDEM TARTRATE 10 MG PO TABS: 10.0000 mg | ORAL_TABLET | Freq: Every evening | ORAL | 1 refills | Status: DC | PRN

## 2021-06-19 MED ORDER — ALPRAZOLAM 0.5 MG PO TABS: 0.5000 mg | ORAL_TABLET | Freq: Three times a day (TID) | ORAL | 5 refills | Status: AC

## 2021-06-19 MED ORDER — METOPROLOL SUCCINATE ER 25 MG PO TB24: ORAL_TABLET | ORAL | 3 refills | Status: AC

## 2021-06-19 NOTE — Progress Notes (Signed)
OFFICE VISIT  06/19/2021  CC:  Chief Complaint  Patient presents with   Hypertension    RCI; not fasting. Labs completed by cardiologist last month    HPI:    Patient is a 61 y.o. female who presents for 6 mo f/u HTN, HLD, bipolar d/o + anxiety (high risk med use). A/P as of last visit: "1) HTN: well controlled on 1/2 25mg  toprol xl qd. Lytes/cr ordered.   2) HLD: atorva 80 qd and zetia 10 qd. LDL goal <70. FLP and hepatic panel ordered.   3) Hx of vit B12 def: per pt report. Elevated MCV, no recent hx of anemia. Vit B12 level ordered.   4) Bipolar d/o, anxiety, insomnia-->high risk med use. CSC UTD. UDS today.   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  Obs + serial imaging is the plan per oncol. Will forward Dr. Benay Spice her lab results.   6) Health maintenance exam: Reviewed age and gender appropriate health maintenance issues (prudent diet, regular exercise, health risks of tobacco and excessive alcohol, use of seatbelts, fire alarms in home, use of sunscreen).  Also reviewed age and gender appropriate health screening as well as vaccine recommendations. Vaccines: Shingrix #2-->she's not sure if she got #2 at pharmacy or not, so we'll check.  No record of Tdap->she declines.  Labs:recent cbc and cmet normal in ED on 10/16/20.  HP labs, B12, LDH->she'll get in future at [facility] lab when fasting. Cervical ca screening: as per Dr. Louretta Shorten, GYN MD. Breast ca screening: 02/2019 is last mammogram, normal per pt.  She gets this via her GYN office. Colon ca screening: cologuard repeat due 04/2022."  INTERIM HX: Maryum is doing fair. Still has some bouts of depression lasting about 2 weeks or so each time. 12 is on the past, ruminates over regrets, feels generally sad, isolates.  No SI or HI.  No significant crying spells.  She had does have anhedonia.  Chronic fatigue.  Often did not sleep well due to her  mood/anxiety.  No panic attacks. She does not use zolpidem much at all.  Denies hypomanic or manic symptoms.   Takes her alprazolam 3 times a day regularly. Taking Paxil 40 mg a day and Lamictal 200 mg a day.  Hem/onc f/u 03/03/21 -->no evidence of progressive lymphoma.  Clinical presentation c/w low grade lymphoma-->f/u 25mo planned and they will consider imaging 09/2021.   PMP AWARE reviewed today: most recent rx for alprazolam was filled 04/15/21, # 90, rx by me. most recent rx for zolpidem was filled 02/28/20, # 36, rx by me.  No red flags.  Past Medical History:  Diagnosis Date   Anxiety and depression    CAD (coronary artery disease)    MI + stent 2018   Colon cancer screening 04/2019   Cologuard NEG.  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   NHL-L peritoneal mesenteric mass-->residual/progressive dz 2022   Dyspepsia    GERD (gastroesophageal reflux disease)    History of myocardial infarction 2018   Hx of migraines    remote past   Hypercholesterolemia    NHL (non-Hodgkin's [place]) 2016   left peritoneal mesonteric mass   Palpitations 2015   no monitoring was done-->resolved spontaneously    Past Surgical History:  Procedure Laterality Date   biopsy  2016   Initial bx needle; then lab abd (done in [facility]) done to get further bx, exp lap--complications--sepsis/hemorrhage--memory/cognitive difficulties since  that surgery/hospitalization--details somewhat cloudy from pt's info.   BREAST ENHANCEMENT SURGERY  2003   Heart Stent  2018   Prox LAD (murrells inlet).   INCISIONAL HERNIA REPAIR  2017   w/mesh   TONSILLECTOMY     TRANSTHORACIC ECHOCARDIOGRAM  09/2013   2015 ALL NORMAL.  2020 normal per pt report (Drr. Metta Clines with [facility] [place]   WISDOM TOOTH EXTRACTION      Outpatient Medications Prior to Visit  Medication Sig Dispense Refill   aspirin EC 81 MG tablet Take by mouth.     atorvastatin (LIPITOR) 80 MG tablet TAKE 1 TABLET EVERY DAY 90  tablet 0   Multiple Vitamins-Minerals (MULTIVITAMIN ADULT PO) Take by mouth daily.     PARoxetine (PAXIL) 40 MG tablet Take 1 tablet (40 mg total) by mouth daily. 90 tablet 3   ezetimibe (ZETIA) 10 MG tablet Take 1 tablet (10 mg total) by mouth daily. 90 tablet 3   lamoTRIgine (LAMICTAL) 200 MG tablet TAKE 1 TABLET EVERY DAY 90 tablet 0   metoprolol succinate (TOPROL-XL) 25 MG 24 hr tablet TAKE 1/2 TABLET ONE TIME DAILY 45 tablet 3   omeprazole (PRILOSEC) 40 MG capsule Take 1 capsule (40 mg total) by mouth daily. 90 capsule 3   nitroGLYCERIN (NITROSTAT) 0.4 MG SL tablet Place under the tongue. (Patient not taking: Reported on 06/19/2021)     ALPRAZolam (XANAX) 0.5 MG tablet TAKE 1 TABLET THREE TIMES DAILY 90 tablet 5   zolpidem (AMBIEN) 10 MG tablet Take 1 tablet (10 mg total) by mouth at bedtime as needed for sleep. 90 tablet 1   No facility-administered medications prior to visit.    Allergies  Allergen Reactions   Sulfa Antibiotics Rash    ROS As per HPI  PE: Vitals with BMI 06/19/2021 03/03/2021 11/28/2020  Height 5\' 3"  5\' 3"  5\' 3"   Weight 142 lbs 3 oz 143 lbs 140 lbs 6 oz  BMI 25.2 28.78 67.67  Systolic 209 470 962  Diastolic 71 64 76  Pulse 67 74 73     Physical Exam  Gen: Alert, well appearing.  Patient is oriented to person, place, time, and situation. AFFECT: pleasant, lucid thought and speech. CV: RRR, no m/r/g.   LUNGS: CTA bilat, nonlabored resps, good aeration in all lung fields. EXT: no clubbing or cyanosis.  no edema.    LABS:  Last CBC Lab Results  Component Value Date   WBC 6.1 03/03/2021   HGB 13.6 03/03/2021   HCT 41.2 03/03/2021   MCV 98.6 03/03/2021   MCH 32.5 03/03/2021   RDW 12.5 03/03/2021   PLT 192 83/66/2947   Last metabolic panel Lab Results  Component Value Date   GLUCOSE 97 03/03/2021   NA 138 03/03/2021   K 4.4 03/03/2021   CL 104 03/03/2021   CO2 28 03/03/2021   BUN 17 03/03/2021   CREATININE 0.74 03/03/2021   GFRNONAA >60  03/03/2021   CALCIUM 9.3 03/03/2021   PROT 6.4 (L) 03/03/2021   ALBUMIN 4.4 03/03/2021   BILITOT 0.7 03/03/2021   ALKPHOS 109 03/03/2021   AST 27 03/03/2021   ALT 29 03/03/2021   ANIONGAP 6 03/03/2021   Last lipids Lab Results  Component Value Date   CHOL 127 05/29/2020   HDL 50.70 05/29/2020   LDLCALC 63 05/29/2020   TRIG 69.0 05/29/2020   CHOLHDL 3 05/29/2020   Last hemoglobin A1c No results found for: HGBA1C Last thyroid functions Lab Results  Component Value Date  TSH 1.60 05/28/2014   Last vitamin B12 and Folate Lab Results  Component Value Date   VITAMINB12 1,462 (H) 03/03/2021   IMPRESSION AND PLAN:  #1 bipolar affective disorder, still not ideally controlled from a depression standpoint. Will increase Lamictal to 300 mg/day.  Continue Paxil 40 mg a day.  #2 chronic anxiety.  This is well controlled on Paxil and alprazolam.  New prescription for alprazolam 0.5 mg, 1 tab 3 times daily, #90, refill x5. Urine drug screen today.  3.  Hypertension, well controlled on 1/2 25mg  toprol xl qd. Lytes/cr ordered.  4. HLD, doing well on atorvastatin 80 mg a day and Zetia 10 mg a day. LDL was 63 about 1 year ago. She has had some sweet tea earlier today--we will check lipid panel.  Hepatic panel today as well.  #5 non-Hodgkin's lymphoma.  Hem/onc f/u 03/03/21 -->no evidence of progressive lymphoma.  Clinical presentation c/w low grade lymphoma-->f/u 37mo planned and they will consider imaging 09/2021.  An After Visit Summary was printed and given to the patient.  FOLLOW UP: Return in about 4 weeks (around 07/17/2021) for f/u mood d/o.  Signed:  Crissie Sickles, MD           06/19/2021

## 2021-06-21 LAB — DRUG MONITORING PANEL 376104, URINE

## 2021-06-21 LAB — DM TEMPLATE

## 2021-07-01 ENCOUNTER — Telehealth: Payer: Self-pay | Admitting: Family Medicine

## 2021-07-01 NOTE — Telephone Encounter (Signed)
Okay, this is fine. ?

## 2021-07-01 NOTE — Telephone Encounter (Signed)
LM for pt to return call regarding medication 

## 2021-07-01 NOTE — Telephone Encounter (Signed)
Pt states the '300mg'$  is too expensive right now. She would prefer to stay on '200mg'$  dose and possibly take 1 and 1/2 tabs. She has enough of '200mg'$  until scheduled appt on 3/24. ? ? ?Please review and advise ? ?

## 2021-07-01 NOTE — Telephone Encounter (Signed)
FYI: ?Pt calling about med below ? ?Pt wanting 200 mg instead of 300 mg. Pt has appt next week. She said she wants to speak to Doctor McGowen about this.  ? ?LamoTRIgine ?LamoTRIgine (LAMICTAL XR) 300 MG TB24 24 hour tablet ?

## 2021-07-02 NOTE — Telephone Encounter (Signed)
LM for pt to returncall

## 2021-07-02 NOTE — Telephone Encounter (Signed)
Pt returned call about medication, told pt that CMA will return her call, she is with a pt. ?Pt ok. ?

## 2021-07-02 NOTE — Telephone Encounter (Signed)
Pt advised of rx instructions. °

## 2021-07-02 NOTE — Telephone Encounter (Signed)
Pt returning call, informed patient to stay close to her phone. Someone will return her call.  ?Pt ok ?

## 2021-07-17 ENCOUNTER — Ambulatory Visit: Payer: Medicare HMO | Admitting: Family Medicine

## 2021-08-18 ENCOUNTER — Other Ambulatory Visit: Payer: Self-pay

## 2021-08-18 ENCOUNTER — Encounter: Payer: Self-pay | Admitting: Family Medicine

## 2021-08-18 ENCOUNTER — Ambulatory Visit (INDEPENDENT_AMBULATORY_CARE_PROVIDER_SITE_OTHER): Payer: Medicare HMO | Admitting: Family Medicine

## 2021-08-18 ENCOUNTER — Telehealth: Payer: Self-pay

## 2021-08-18 VITALS — BP 108/65 | HR 68 | Temp 97.7°F | Ht 63.0 in | Wt 142.0 lb

## 2021-08-18 DIAGNOSIS — R21 Rash and other nonspecific skin eruption: Secondary | ICD-10-CM | POA: Diagnosis not present

## 2021-08-18 DIAGNOSIS — R6883 Chills (without fever): Secondary | ICD-10-CM | POA: Diagnosis not present

## 2021-08-18 DIAGNOSIS — N12 Tubulo-interstitial nephritis, not specified as acute or chronic: Secondary | ICD-10-CM | POA: Diagnosis not present

## 2021-08-18 DIAGNOSIS — R410 Disorientation, unspecified: Secondary | ICD-10-CM | POA: Diagnosis not present

## 2021-08-18 DIAGNOSIS — R300 Dysuria: Secondary | ICD-10-CM | POA: Diagnosis not present

## 2021-08-18 DIAGNOSIS — R9431 Abnormal electrocardiogram [ECG] [EKG]: Secondary | ICD-10-CM | POA: Diagnosis not present

## 2021-08-18 DIAGNOSIS — I959 Hypotension, unspecified: Secondary | ICD-10-CM | POA: Diagnosis not present

## 2021-08-18 DIAGNOSIS — Z882 Allergy status to sulfonamides status: Secondary | ICD-10-CM | POA: Diagnosis not present

## 2021-08-18 DIAGNOSIS — R4020 Unspecified coma: Secondary | ICD-10-CM | POA: Diagnosis not present

## 2021-08-18 DIAGNOSIS — T7840XA Allergy, unspecified, initial encounter: Secondary | ICD-10-CM | POA: Diagnosis not present

## 2021-08-18 DIAGNOSIS — Z881 Allergy status to other antibiotic agents status: Secondary | ICD-10-CM | POA: Diagnosis not present

## 2021-08-18 LAB — URINALYSIS, ROUTINE W REFLEX MICROSCOPIC
Hgb urine dipstick: NEGATIVE
Leukocytes,Ua: NEGATIVE
Nitrite: POSITIVE — AB
RBC / HPF: NONE SEEN (ref 0–?)
Specific Gravity, Urine: >=1.03 — AB (ref 1.000–1.030)
Total Protein, Urine: 30 — AB
Urine Glucose: 100 — AB
Urobilinogen, UA: 4 — AB (ref 0.0–1.0)
pH: 5.5 (ref 5.0–8.0)

## 2021-08-18 LAB — POCT URINALYSIS DIPSTICK
Bilirubin, UA: POSITIVE
Blood, UA: NEGATIVE
Glucose, UA: POSITIVE — AB
Ketones, UA: POSITIVE
Nitrite, UA: POSITIVE
Protein, UA: POSITIVE — AB
Spec Grav, UA: >=1.03 — AB (ref 1.010–1.025)
Urobilinogen, UA: 2 E.U./dL — AB
pH, UA: 5 (ref 5.0–8.0)

## 2021-08-18 MED ORDER — CEFTRIAXONE SODIUM 1 G IJ SOLR
1.0000 g | Freq: Once | INTRAMUSCULAR | Status: AC
  Administered 2021-08-18: 1 g via INTRAMUSCULAR

## 2021-08-18 MED ORDER — CEFDINIR 300 MG PO CAPS: ORAL_CAPSULE | ORAL | 0 refills | Status: DC

## 2021-08-18 NOTE — Telephone Encounter (Signed)
Patient spouse called (DPR) he is stating that his wife was here earlier and received Rocephin shot and thinks she is having an allergic reaction. ?Broke out in hives and hard to breathe. ?I transferred call to Daytime Triage. ? ?Patient husband called back, they called EMS, and she is being transported to ED right now. ?

## 2021-08-18 NOTE — Telephone Encounter (Signed)
Sent as FYI ? ?[facility] - Client ?TELEPHONE ADVICE RECORD ?[facility]? ?Patient Name: ?Gabrielle [name] ?Gender: Female ?DOB: [birth year] ?Age: 61 Y 21 D ?Return Phone Number: [phone] ?(Primary), ?[phone] ?(Secondary) ?Address: ?City/State/Zip: [place] [address] ?Client [facility] - Client ?Client Site [facility] - Day ?Provider Crissie Sickles - MD ?Contact Type Call ?Who Is Calling Patient / Member / Family / Caregiver ?Call Type Triage / Clinical ?Caller Name Aalaysia Liggins ?Relationship To Patient Spouse ?Return Phone Number [phone] (Primary) ?Chief Complaint Numbness ?Reason for Call Symptomatic / Request for Health Information ?Initial Comment Caller states his wife had a rocephin injection ?about an hour ago. She is now red all over with ?hives. She is getting numb on her upper lip to her nose ?[facility]- ED ?Translation No ?Nurse Assessment ?Nurse: Breeding, RN, [place] Date/Time (Eastern Time): 08/18/2021 12:25:02 PM ?Confirm and document reason for call. If ?symptomatic, describe symptoms. ?---Caller states his wife had a rocephin injection about ?an hour ago. She is now red all over with hives and ?itchy. She is getting numb on her upper lip to her ?nose. Caller notes facial swelling and numbness into ?her face too. Caller denies any difficulty breathing or ?swallowing or tongue welling. Denies any other s/s at ?this time. ?Does the patient have any new or worsening ?symptoms? ---Yes ?Will a triage be completed? ---Yes ?Related visit to physician within the last 2 weeks? ---N/A ?Does the PT have any chronic conditions? (i.e. ?diabetes, asthma, this includes High risk factors for ?pregnancy, etc.) ?---Unknown ?Is this a behavioral health or substance abuse call? ---No ?Guidelines ?Guideline Title Affirmed Question Affirmed Notes Nurse Date/Time (Eastern ?Time) ?Face Swelling [1] SEVERE swelling ?of entire face  AND ?[2] < 2 hours since ?exposure to highBreeding, RN, ?Reserve ?08/18/2021 12:28:20 ?PM ?PLEASE NOTE: All timestamps contained within this report are represented as [place] Standard Time. ?CONFIDENTIALTY NOTICE: This fax transmission is intended only for the addressee. It contains information that is legally privileged, confidential or ?otherwise protected from use or disclosure. If you are not the intended recipient, you are strictly prohibited from reviewing, disclosing, copying using ?or disseminating any of this information or taking any action in reliance on or regarding this information. If you have received this fax in error, please ?notify us immediately by telephone so that we can arrange for its return to [place]. Phone: [phone], Toll-Free: [phone], Fax: [phone] ?Page: 2 of 2 ?Call Id: [id number] ?Guidelines ?Guideline Title Affirmed Question Affirmed Notes Nurse Date/Time (Eastern ?Time) ?risk allergen (e.g., ?peanuts, tree nuts, ?fish, shellfish or ?1st dose of drug) ?AND [3] no serious ?symptoms AND [4] ?no serious allergic ?reaction in the past ?Disp. Time (Eastern ?Time) Disposition Final User ?08/18/2021 12:31:09 PM Go to ED Now Yes Breeding, RN, [place] ?Caller Disagree/Comply Comply ?Caller Understands Yes ?PreDisposition Go to Urgent Care/Walk-In Clinic ?Care Advice Given Per Guideline ?GO TO ED NOW: * You need to be seen in the Emergency Department. * Go to the ED at ___________ [place] now. ?Drive carefully. CALL EMS 911 IF: * Any difficulty breathing or swallowing * Any tongue swelling or swelling inside your mouth. ?Comments ?User: Jenny Reichmann, RN Date/Time Eilene Ghazi Time): 08/18/2021 12:26:10 PM ?Pt provides permission for [place] to discuss her medical information. ?Referrals ?GO TO FACILITY OTHER - SPECIFY ?

## 2021-08-18 NOTE — Telephone Encounter (Addendum)
Provider verbally made aware as well. Requested to have cefdinir rx previously sent cancelled. Spoke with pharmacy, rx sent. Pt currently at [facility] ED.  Allergy list updated ?

## 2021-08-18 NOTE — Progress Notes (Signed)
OFFICE VISIT ? ?08/18/2021 ? ?CC:  ?Chief Complaint  ?Patient presents with  ? Back Pain  ?  3 days ago;  getting progressively worse,  radiating towards R side going up back, using aleve and heating pad  ? Chills  ?  Yesterday along with fatigue. Last night burning with urination started. Started using AZO last night, urine was light yellow/clear.  ? ? ?Patient is a 61 y.o. female who presents for back pain and chills. ? ?HPI: ?Onset 3 days ago, diffuse low back pain, severe fatigue, some urinary urgency and frequency and burning.  She felt some chills but does not sound like rigors.  No detectable fever.  The pain localized more to the right side of her back yesterday briefly but then went back to symmetrically affecting entire low back up into the base of her neck. ?No nausea vomiting or diarrhea.  No cough or shortness of breath. ?Took 2 Aleve yesterday morning and yesterday evening and no improvement. ? ?ROS as above, plus--> no CP,, no wheezing, no dizziness, no HAs, no rashes, no melena/hematochezia.  No polyuria or polydipsia.  No myalgias or arthralgias.  No focal weakness, paresthesias, or tremors.  No acute vision or hearing abnormalities.   No recent changes in lower legs. ?No n/v/d or abd pain.  No palpitations.   ? ? ?Past Medical History:  ?Diagnosis Date  ? Anxiety and depression   ? CAD (coronary artery disease)   ? MI + stent 2018  ? Colon cancer screening 04/2019  ? Cologuard NEG.  Repeat 04/2022  ? 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 NHL-L peritoneal mesenteric mass-->no evidence of aggressive lymphoma as of 02/2021 hem/onc f/u  ? Dyspepsia   ? GERD (gastroesophageal reflux disease)   ? History of myocardial infarction 2018  ? Hx of migraines   ? remote past  ? Hypercholesterolemia   ? NHL (non-Hodgkin's lymphoma) ([place]) 2016  ? left peritoneal mesonteric mass  ? Palpitations 2015  ? no monitoring was done-->resolved spontaneously  ? ? ?Past Surgical History:  ?Procedure Laterality Date  ?  biopsy  2016  ? Initial bx needle; then lab abd (done in [facility]) done to get further bx, exp lap--complications--sepsis/hemorrhage--memory/cognitive difficulties since that surgery/hospitalization--details somewhat cloudy from pt's info.  ? BREAST ENHANCEMENT SURGERY  2003  ? Heart Stent  2018  ? Prox LAD (murrells inlet).  ? INCISIONAL HERNIA REPAIR  2017  ? w/mesh  ? TONSILLECTOMY    ? TRANSTHORACIC ECHOCARDIOGRAM  09/2013  ? 2015 ALL NORMAL.  2020 normal per pt report (Drr. Metta Clines with [facility] [place]  ? WISDOM TOOTH EXTRACTION    ? ? ?Outpatient Medications Prior to Visit  ?Medication Sig Dispense Refill  ? ALPRAZolam (XANAX) 0.5 MG tablet Take 1 tablet (0.5 mg total) by mouth 3 (three) times daily. 90 tablet 5  ? aspirin EC 81 MG tablet Take by mouth.    ? atorvastatin (LIPITOR) 80 MG tablet TAKE 1 TABLET EVERY DAY 90 tablet 0  ? ezetimibe (ZETIA) 10 MG tablet Take 1 tablet (10 mg total) by mouth daily. 90 tablet 3  ? LamoTRIgine (LAMICTAL XR) 300 MG TB24 24 hour tablet 1 tab po qd 90 tablet 1  ? metoprolol succinate (TOPROL-XL) 25 MG 24 hr tablet TAKE 1/2 TABLET ONE TIME DAILY 45 tablet 3  ? Multiple Vitamins-Minerals (MULTIVITAMIN ADULT PO) Take by mouth daily.    ? nitroGLYCERIN (NITROSTAT) 0.4 MG SL tablet Place under the tongue.    ?  omeprazole (PRILOSEC) 40 MG capsule Take 1 capsule (40 mg total) by mouth daily. 90 capsule 3  ? PARoxetine (PAXIL) 40 MG tablet Take 1 tablet (40 mg total) by mouth daily. 90 tablet 3  ? zolpidem (AMBIEN) 10 MG tablet Take 1 tablet (10 mg total) by mouth at bedtime as needed for sleep. 30 tablet 1  ? ?No facility-administered medications prior to visit.  ? ? ?Allergies  ?Allergen Reactions  ? Sulfa Antibiotics Rash  ? ? ?ROS ?As per HPI ? ?PE: ? ?  08/18/2021  ? 10:35 AM 06/19/2021  ?  1:03 PM 03/03/2021  ?  2:35 PM  ?Vitals with BMI  ?Height '5\' 3"'$  '5\' 3"'$  '5\' 3"'$   ?Weight 142 lbs 142 lbs 3 oz 143 lbs  ?BMI 25.16 25.2 25.34  ?Systolic 967 893 810  ?Diastolic 65 71 64  ?Pulse 68 67 74   ? ?Physical Exam ? ?General: Appears tired but no distress ?Cardiovascular: Regular rhythm and rate without murmur. ?Lungs clear to auscultation bilaterally, nonlabored respirations. ?No CVA or flank tenderness to palpation ?Her back and neck are not tender to palpation. ? ? ?LABS:  ?Last CBC ?Lab Results  ?Component Value Date  ? WBC 6.1 03/03/2021  ? HGB 13.6 03/03/2021  ? HCT 41.2 03/03/2021  ? MCV 98.6 03/03/2021  ? MCH 32.5 03/03/2021  ? RDW 12.5 03/03/2021  ? PLT 192 03/03/2021  ? ?Last metabolic panel ?Lab Results  ?Component Value Date  ? GLUCOSE 79 06/19/2021  ? NA 137 06/19/2021  ? K 4.3 06/19/2021  ? CL 101 06/19/2021  ? CO2 35 (H) 06/19/2021  ? BUN 14 06/19/2021  ? CREATININE 0.72 06/19/2021  ? GFRNONAA >60 03/03/2021  ? CALCIUM 9.8 06/19/2021  ? PROT 6.2 06/19/2021  ? ALBUMIN 4.4 06/19/2021  ? BILITOT 0.8 06/19/2021  ? ALKPHOS 105 06/19/2021  ? AST 35 06/19/2021  ? ALT 39 (H) 06/19/2021  ? ANIONGAP 6 03/03/2021  ? ?Dipstick UA today: Color is purple, indicating Azo contamination.  Globally positive on analysis ? ?IMPRESSION AND PLAN: ? ?UTI with systemic symptoms. ?UA today contaminated with Azo, uninterpretable urinalysis. ?We will send urine for culture as well as microscopy. ?Rocephin 1 g IM here today.  Starting tomorrow take Omnicef 300 mg twice daily x6 days. ?Continue Aleve for 40 twice daily as needed. ?Hydrate and rest. ? ?An After Visit Summary was printed and given to the patient. ? ?FOLLOW UP: Return for 2-3d f/u pyelo. ? ?Signed:  Crissie Sickles, MD           08/18/2021 ? ?

## 2021-08-19 ENCOUNTER — Telehealth: Payer: Self-pay

## 2021-08-19 LAB — URINE CULTURE
MICRO NUMBER:: 13309843
Result:: NO GROWTH
SPECIMEN QUALITY:: ADEQUATE

## 2021-08-19 NOTE — Telephone Encounter (Addendum)
Requesting records; pt's husband informed us of the following regarding medications. EpiPen Jr. as needed. Pepcid '20mg'$  bid x 7 days. Apresoline '25mg'$  tid/prn x 5 days. Prednisone '20mg'$  - 60/40/20 taper over 6 days. ?

## 2021-08-19 NOTE — Telephone Encounter (Signed)
Spoke with pt to follow up and see how she was doing. She is feeling better but still tired, having back pain, abd is sore. She was given rx for apresoline, prednisone and Pepcid. He was not sure the doses but will be picking up the meds later today. Pt is scheduled to f/u on 4/28 ? ? ?Sent as FYI ?

## 2021-08-19 NOTE — Telephone Encounter (Signed)
User error

## 2021-08-19 NOTE — Telephone Encounter (Signed)
Okay. ?Please get ED records. ?

## 2021-08-19 NOTE — Telephone Encounter (Signed)
Called and spoke with her husband Ed and confirmed that she had been prescribed hydralazine by the [facility] emergency department yesterday when she was seen for anaphylactic reaction.  I reviewed their records and the only documentation of blood pressure was in the HPI and it stated systolic pressure 89. ?It does list hydralazine, famotidine, and prednisone as medications prescribed at that encounter. ?Unsure if maybe it was supposed to have been prescribed as hydroxyzine (???). ?At any rate I advised her to NOT take hydralazine. ?She is currently without any itching or rash so I recommended she not take any H1 or H2 blocker as well. ?She will continue with prednisone and I will see her in 2 days. ?Signed:  Crissie Sickles, MD           08/19/2021 ? ?

## 2021-08-21 ENCOUNTER — Encounter: Payer: Self-pay | Admitting: Family Medicine

## 2021-08-21 ENCOUNTER — Ambulatory Visit (INDEPENDENT_AMBULATORY_CARE_PROVIDER_SITE_OTHER): Payer: Medicare HMO | Admitting: Family Medicine

## 2021-08-21 VITALS — BP 123/74 | HR 58 | Temp 97.7°F | Ht 63.0 in | Wt 144.2 lb

## 2021-08-21 DIAGNOSIS — T782XXD Anaphylactic shock, unspecified, subsequent encounter: Secondary | ICD-10-CM

## 2021-08-21 DIAGNOSIS — N12 Tubulo-interstitial nephritis, not specified as acute or chronic: Secondary | ICD-10-CM | POA: Diagnosis not present

## 2021-08-21 NOTE — Progress Notes (Signed)
OFFICE VISIT ? ?08/21/2021 ? ?CC:  ?Chief Complaint  ?Patient presents with  ? UTI  ?  Follow up  ? ?Patient is a 61 y.o. female who presents for 3-day follow-up UTI with systemic symptoms as well as follow-up anaphylaxis secondary to Rocephin. ? ?INTERIM HX: ?She got onset of itchy hives, facial swelling, shortness of breath, and passed out upon arriving home after I saw her couple days ago--- anaphylactic reaction to Rocephin.  She went to the ED, was noted to have low blood pressure at that time.  Was given epinephrine.  She stabilized and was able to be discharged home same day. ?It appears she had a anaphylactic reaction to Rocephin. ?Her urine culture 08/18/2021 showed no growth. ? ?CURRENTLY: ?She is feeling very well, back to her baseline state of health. ?Denies any urinary urgency or frequency.  She has some diffuse lower back pain that is mild.  No CVA pain, no fever, no nausea, no flank pain, no fatigue. ? ?Her rash, swelling, and itching have all resolved.  She continues to take prednisone and Pepcid. ? ? ?Past Medical History:  ?Diagnosis Date  ? Anxiety and depression   ? CAD (coronary artery disease)   ? MI + stent 2018  ? Colon cancer screening 04/2019  ? Cologuard NEG.  Repeat 04/2022  ? 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 NHL-L peritoneal mesenteric mass-->no evidence of aggressive lymphoma as of 02/2021 hem/onc f/u  ? Dyspepsia   ? GERD (gastroesophageal reflux disease)   ? History of myocardial infarction 2018  ? Hx of migraines   ? remote past  ? Hypercholesterolemia   ? NHL (non-Hodgkin's lymphoma) ([place]) 2016  ? left peritoneal mesonteric mass  ? Palpitations 2015  ? no monitoring was done-->resolved spontaneously  ? ? ?Past Surgical History:  ?Procedure Laterality Date  ? biopsy  2016  ? Initial bx needle; then lab abd (done in [facility]) done to get further bx, exp lap--complications--sepsis/hemorrhage--memory/cognitive difficulties since that surgery/hospitalization--details somewhat cloudy  from pt's info.  ? BREAST ENHANCEMENT SURGERY  2003  ? Heart Stent  2018  ? Prox LAD (murrells inlet).  ? INCISIONAL HERNIA REPAIR  2017  ? w/mesh  ? TONSILLECTOMY    ? TRANSTHORACIC ECHOCARDIOGRAM  09/2013  ? 2015 ALL NORMAL.  2020 normal per pt report (Drr. Metta Clines with [facility] in [place]  ? WISDOM TOOTH EXTRACTION    ? ? ?Outpatient Medications Prior to Visit  ?Medication Sig Dispense Refill  ? ALPRAZolam (XANAX) 0.5 MG tablet Take 1 tablet (0.5 mg total) by mouth 3 (three) times daily. 90 tablet 5  ? aspirin EC 81 MG tablet Take by mouth.    ? atorvastatin (LIPITOR) 80 MG tablet TAKE 1 TABLET EVERY DAY 90 tablet 0  ? ezetimibe (ZETIA) 10 MG tablet Take 1 tablet (10 mg total) by mouth daily. 90 tablet 3  ? famotidine (PEPCID) 20 MG tablet Take by mouth.    ? LamoTRIgine (LAMICTAL XR) 300 MG TB24 24 hour tablet 1 tab po qd 90 tablet 1  ? metoprolol succinate (TOPROL-XL) 25 MG 24 hr tablet TAKE 1/2 TABLET ONE TIME DAILY 45 tablet 3  ? Multiple Vitamins-Minerals (MULTIVITAMIN ADULT PO) Take by mouth daily.    ? omeprazole (PRILOSEC) 40 MG capsule Take 1 capsule (40 mg total) by mouth daily. 90 capsule 3  ? PARoxetine (PAXIL) 40 MG tablet Take 1 tablet (40 mg total) by mouth daily. 90 tablet 3  ? predniSONE (DELTASONE) 20 MG  tablet Take 20 mg by mouth 2 (two) times daily.    ? zolpidem (AMBIEN) 10 MG tablet Take 1 tablet (10 mg total) by mouth at bedtime as needed for sleep. 30 tablet 1  ? EPINEPHrine (EPIPEN JR) 0.15 MG/0.3ML injection SMARTSIG:1 IM Daily (Patient not taking: Reported on 08/21/2021)    ? hydrOXYzine (ATARAX) 25 MG tablet Take 25 mg by mouth 3 (three) times daily. (Patient not taking: Reported on 08/21/2021)    ? nitroGLYCERIN (NITROSTAT) 0.4 MG SL tablet Place under the tongue. (Patient not taking: Reported on 08/21/2021)    ? cefdinir (OMNICEF) 300 MG capsule Take 300 mg by mouth 2 (two) times daily.    ? ?No facility-administered medications prior to visit.  ? ? ?Allergies  ?Allergen Reactions  ?  Rocephin [Ceftriaxone] Hives and Shortness Of Breath  ?  Facial numbness  ? Sulfa Antibiotics Itching and Rash  ? ? ?ROS ?As per HPI ? ?PE: ? ?  08/21/2021  ?  9:54 AM 08/18/2021  ? 10:35 AM 06/19/2021  ?  1:03 PM  ?Vitals with BMI  ?Height '5\' 3"'$  '5\' 3"'$  '5\' 3"'$   ?Weight 144 lbs 3 oz 142 lbs 142 lbs 3 oz  ?BMI 25.55 25.16 25.2  ?Systolic 102 725 366  ?Diastolic 74 65 71  ?Pulse 58 68 67  ? ?Physical Exam ? ?Neuro: Alert and well-appearing. ?Affect: Pleasant, lucid thought and speech. ?Skin: No rash. ?Face: No swelling of lips or anywhere on the face. ? ?LABS:  ?Last CBC ?Lab Results  ?Component Value Date  ? WBC 6.1 03/03/2021  ? HGB 13.6 03/03/2021  ? HCT 41.2 03/03/2021  ? MCV 98.6 03/03/2021  ? MCH 32.5 03/03/2021  ? RDW 12.5 03/03/2021  ? PLT 192 03/03/2021  ? ?Last metabolic panel ?Lab Results  ?Component Value Date  ? GLUCOSE 79 06/19/2021  ? NA 137 06/19/2021  ? K 4.3 06/19/2021  ? CL 101 06/19/2021  ? CO2 35 (H) 06/19/2021  ? BUN 14 06/19/2021  ? CREATININE 0.72 06/19/2021  ? GFRNONAA >60 03/03/2021  ? CALCIUM 9.8 06/19/2021  ? PROT 6.2 06/19/2021  ? ALBUMIN 4.4 06/19/2021  ? BILITOT 0.8 06/19/2021  ? ALKPHOS 105 06/19/2021  ? AST 35 06/19/2021  ? ALT 39 (H) 06/19/2021  ? ANIONGAP 6 03/03/2021  ? ?IMPRESSION AND PLAN: ? ?1) Pyelonephritis suspected, completely resolved with 1 shot of Rocephin. ?Her presenting symptoms matched this diagnosis well but the fact that her urine culture showed no growth coupled with the fact that all symptoms resolved with 1 shot of Rocephin does make this perplexing. ?At any rate, she is completely back to feeling well. ? ?2) Anaphylactic reaction to Rocephin--completely resolved. ?Finish prednisone. ?She is not taking hydroxyzine, which I think is fine at this time. ? ?An After Visit Summary was printed and given to the patient. ? ?FOLLOW UP: Return in about 4 months (around 12/21/2021) for annual CPE (fasting. ? ?Signed:  Crissie Sickles, MD           08/21/2021 ? ?

## 2021-08-31 ENCOUNTER — Encounter: Payer: Self-pay | Admitting: Nurse Practitioner

## 2021-08-31 ENCOUNTER — Inpatient Hospital Stay: Payer: Medicare HMO | Attending: Oncology | Admitting: Nurse Practitioner

## 2021-08-31 VITALS — BP 123/63 | HR 83 | Temp 98.1°F | Resp 18 | Ht 63.0 in | Wt 142.2 lb

## 2021-08-31 DIAGNOSIS — Z8572 Personal history of non-Hodgkin lymphomas: Secondary | ICD-10-CM | POA: Diagnosis not present

## 2021-08-31 DIAGNOSIS — E785 Hyperlipidemia, unspecified: Secondary | ICD-10-CM | POA: Insufficient documentation

## 2021-08-31 DIAGNOSIS — C8580 Other specified types of non-Hodgkin lymphoma, unspecified site: Secondary | ICD-10-CM

## 2021-08-31 DIAGNOSIS — F419 Anxiety disorder, unspecified: Secondary | ICD-10-CM | POA: Insufficient documentation

## 2021-08-31 DIAGNOSIS — F32A Depression, unspecified: Secondary | ICD-10-CM | POA: Diagnosis not present

## 2021-08-31 NOTE — Progress Notes (Signed)
?  [facility] ?OFFICE PROGRESS NOTE ? ? ?Diagnosis: Non-Hodgkin's lymphoma ? ?INTERVAL HISTORY:  ? ?Gabrielle [name] returns as scheduled.  She feels well.  No fevers or sweats.  No weight loss.  Good appetite.  She is not aware of any enlarged lymph nodes. ? ?Objective: ? ?Vital signs in last 24 hours: ? ?Blood pressure 123/63, pulse 83, temperature 98.1 ?F (36.7 ?C), temperature source Oral, resp. rate 18, height _0  (1.6 m), weight 142 lb 3.2 oz (64.5 kg), last menstrual period 10/20/2011, SpO2 100 %. ?  ?Lymphatics: No palpable cervical, supraclavicular, axillary or inguinal lymph nodes. ?Resp: Lungs clear bilaterally. ?Cardio: Regular rate and rhythm. ?GI: Abdomen soft and nontender.  No hepatosplenomegaly.  No mass. ?Vascular: No leg edema. ? ?Lab Results: ? ?Lab Results  ?Component Value Date  ? WBC 6.1 03/03/2021  ? HGB 13.6 03/03/2021  ? HCT 41.2 03/03/2021  ? MCV 98.6 03/03/2021  ? PLT 192 03/03/2021  ? NEUTROABS 3.4 03/03/2021  ? ? ?Imaging: ? ?No results found. ? ?Medications: I have reviewed the patient's current medications. ? ?Assessment/Plan: ?Non-Hodgkin lymphoma, B-cell follicular center cell type ?CT abdomen/pelvis 05/29/2014-left abdominal intraperitoneal mass, subcentimeter liver lesions felt to be benign, no adenopathy ?CT-guided biopsy of left abdominal mass 06/04/2014-non-Hodgkin B-cell lymphoma, follicular center cell type, high-grade favored, abundance of large lymphocytes admixed with small angulated lymphocytes ?Relocated to [facility], underwent an open diagnostic biopsy 07/16/2014-pathology from mesenteric lymph node biopsies revealed sclerosing follicular B-cell lymphoma, CD20 positive, CD10 positive, predominantly diffuse architecture, low-grade.  Flow cytometry revealed a monoclonal B-cell population, lambda light chain restricted, 12.9% large cells, c-Myc rearrangement not detected, KI-67 20% ?She was not treated for lymphoma  ?PET 12/12/2014-markedly decreased  size of hypermetabolic mesenteric mass, measuring 2.3 x 3.8 cm compared to 4.8 x 7.9 cm with SUV 7.2 compared to 54.6 hypermetabolic activity in the region of the retroperitoneal reflection no longer evident, hypermetabolic soft tissue track extending from the bladder dome to the periumbilical region ?PET 07/31/2015-previously noted hypermetabolic mesenteric mass no longer visualized, no hypermetabolic lymphadenopathy or extranodal disease, midline subincisional fluid collection measuring 3 x 6 cm ?CT abdomen/pelvis 10/16/2020-compared to 05/29/2014 CT, no change in left lateral liver hypodensity, abnormal mesenteric adenopathy in the left abdomen at the site of a previous mesenteric mass, representative node measuring 15 mm, majority of nodes are 5-10 mm, no retroperitoneal or upper abdominal adenopathy, no pelvic sidewall or inguinal adenopathy ?2.  coronary artery disease ?3.  Anxiety and depression ?4.  Hyperlipidemia ? ?Disposition: Gabrielle [name] appears stable.  There is no clinical evidence of progression of the non-Hodgkin's lymphoma.  Plan for repeat CT scans in approximately 4 weeks.  This will be at a 1 year interval from the previous.  She would like to return to review the results in person.  We will see her 2 to 3 days after scans have been completed. ? ? ? ?Ned Card ANP/GNP-BC  ? ?08/31/2021  ?9:10 AM ? ? ? ? ? ? ? ?

## 2021-09-08 ENCOUNTER — Other Ambulatory Visit (HOSPITAL_BASED_OUTPATIENT_CLINIC_OR_DEPARTMENT_OTHER): Payer: Medicare HMO

## 2021-09-23 ENCOUNTER — Ambulatory Visit (INDEPENDENT_AMBULATORY_CARE_PROVIDER_SITE_OTHER): Payer: Medicare HMO

## 2021-09-23 DIAGNOSIS — Z0000 Encounter for general adult medical examination without abnormal findings: Secondary | ICD-10-CM

## 2021-09-23 NOTE — Patient Instructions (Signed)
Gabrielle [name] , Thank you for taking time to come for your Medicare Wellness Visit. I appreciate your ongoing commitment to your health goals. Please review the following plan we discussed and let me know if I can assist you in the future.   Screening recommendations/referrals: Colonoscopy: Cologuard 05/02/19 Mammogram: Declined at this time  Recommended yearly ophthalmology/optometry visit for glaucoma screening and checkup Recommended yearly dental visit for hygiene and checkup  Vaccinations: Influenza vaccine: declined  Tdap vaccine: Due Shingles vaccine: 1st dose 07/03/19  Covid-19: Completed 4/7, 08/28/19 & 11/10/20  Advanced directives: Please bring a copy of your health care power of attorney and living will to the office at your convenience.  Conditions/risks identified: None at this time   Next appointment: Follow up in one year for your annual wellness visit.   Preventive Care 40-64 Years, Female Preventive care refers to lifestyle choices and visits with your health care provider that can promote health and wellness. What does preventive care include? A yearly physical exam. This is also called an annual well check. Dental exams once or twice a year. Routine eye exams. Ask your health care provider how often you should have your eyes checked. Personal lifestyle choices, including: Daily care of your teeth and gums. Regular physical activity. Eating a healthy diet. Avoiding tobacco and drug use. Limiting alcohol use. Practicing safe sex. Taking low-dose aspirin daily starting at age 28. Taking vitamin and mineral supplements as recommended by your health care provider. What happens during an annual well check? The services and screenings done by your health care provider during your annual well check will depend on your age, overall health, lifestyle risk factors, and family history of disease. Counseling  Your health care provider may ask you questions about your: Alcohol  use. Tobacco use. Drug use. Emotional well-being. Home and relationship well-being. Sexual activity. Eating habits. Work and work Statistician. Method of birth control. Menstrual cycle. Pregnancy history. Screening  You may have the following tests or measurements: Height, weight, and BMI. Blood pressure. Lipid and cholesterol levels. These may be checked every 5 years, or more frequently if you are over 95 years old. Skin check. Lung cancer screening. You may have this screening every year starting at age 73 if you have a 30-pack-year history of smoking and currently smoke or have quit within the past 15 years. Fecal occult blood test (FOBT) of the stool. You may have this test every year starting at age 64. Flexible sigmoidoscopy or colonoscopy. You may have a sigmoidoscopy every 5 years or a colonoscopy every 10 years starting at age 39. Hepatitis C blood test. Hepatitis B blood test. Sexually transmitted disease (STD) testing. Diabetes screening. This is done by checking your blood sugar (glucose) after you have not eaten for a while (fasting). You may have this done every 1-3 years. Mammogram. This may be done every 1-2 years. Talk to your health care provider about when you should start having regular mammograms. This may depend on whether you have a family history of breast cancer. BRCA-related cancer screening. This may be done if you have a family history of breast, ovarian, tubal, or peritoneal cancers. Pelvic exam and Pap test. This may be done every 3 years starting at age 79. Starting at age 70, this may be done every 5 years if you have a Pap test in combination with an HPV test. Bone density scan. This is done to screen for osteoporosis. You may have this scan if you are at high risk  for osteoporosis. Discuss your test results, treatment options, and if necessary, the need for more tests with your health care provider. Vaccines  Your health care provider may recommend  certain vaccines, such as: Influenza vaccine. This is recommended every year. Tetanus, diphtheria, and acellular pertussis (Tdap, Td) vaccine. You may need a Td booster every 10 years. Zoster vaccine. You may need this after age 36. Pneumococcal 13-valent conjugate (PCV13) vaccine. You may need this if you have certain conditions and were not previously vaccinated. Pneumococcal polysaccharide (PPSV23) vaccine. You may need one or two doses if you smoke cigarettes or if you have certain conditions. Talk to your health care provider about which screenings and vaccines you need and how often you need them. This information is not intended to replace advice given to you by your health care provider. Make sure you discuss any questions you have with your health care provider. Document Released: 05/09/2015 Document Revised: 12/31/2015 Document Reviewed: 02/11/2015 Elsevier Interactive Patient Education  2017 [place] Prevention in the Home Falls can cause injuries. They can happen to people of all ages. There are many things you can do to make your home safe and to help prevent falls. What can I do on the outside of my home? Regularly fix the edges of walkways and driveways and fix any cracks. Remove anything that might make you trip as you walk through a door, such as a raised step or threshold. Trim any bushes or trees on the path to your home. Use bright outdoor lighting. Clear any walking paths of anything that might make someone trip, such as rocks or tools. Regularly check to see if handrails are loose or broken. Make sure that both sides of any steps have handrails. Any raised decks and porches should have guardrails on the edges. Have any leaves, snow, or ice cleared regularly. Use sand or salt on walking paths during winter. Clean up any spills in your garage right away. This includes oil or grease spills. What can I do in the bathroom? Use night lights. Install grab bars  by the toilet and in the tub and shower. Do not use towel bars as grab bars. Use non-skid mats or decals in the tub or shower. If you need to sit down in the shower, use a plastic, non-slip stool. Keep the floor dry. Clean up any water that spills on the floor as soon as it happens. Remove soap buildup in the tub or shower regularly. Attach bath mats securely with double-sided non-slip rug tape. Do not have throw rugs and other things on the floor that can make you trip. What can I do in the bedroom? Use night lights. Make sure that you have a light by your bed that is easy to reach. Do not use any sheets or blankets that are too big for your bed. They should not hang down onto the floor. Have a firm chair that has side arms. You can use this for support while you get dressed. Do not have throw rugs and other things on the floor that can make you trip. What can I do in the kitchen? Clean up any spills right away. Avoid walking on wet floors. Keep items that you use a lot in easy-to-reach places. If you need to reach something above you, use a strong step stool that has a grab bar. Keep electrical cords out of the way. Do not use floor polish or wax that makes floors slippery. If you must  use wax, use non-skid floor wax. Do not have throw rugs and other things on the floor that can make you trip. What can I do with my stairs? Do not leave any items on the stairs. Make sure that there are handrails on both sides of the stairs and use them. Fix handrails that are broken or loose. Make sure that handrails are as long as the stairways. Check any carpeting to make sure that it is firmly attached to the stairs. Fix any carpet that is loose or worn. Avoid having throw rugs at the top or bottom of the stairs. If you do have throw rugs, attach them to the floor with carpet tape. Make sure that you have a light switch at the top of the stairs and the bottom of the stairs. If you do not have them, ask  someone to add them for you. What else can I do to help prevent falls? Wear shoes that: Do not have high heels. Have rubber bottoms. Are comfortable and fit you well. Are closed at the toe. Do not wear sandals. If you use a stepladder: Make sure that it is fully opened. Do not climb a closed stepladder. Make sure that both sides of the stepladder are locked into place. Ask someone to hold it for you, if possible. Clearly mark and make sure that you can see: Any grab bars or handrails. First and last steps. Where the edge of each step is. Use tools that help you move around (mobility aids) if they are needed. These include: Canes. Walkers. Scooters. Crutches. Turn on the lights when you go into a dark area. Replace any light bulbs as soon as they burn out. Set up your furniture so you have a clear path. Avoid moving your furniture around. If any of your floors are uneven, fix them. If there are any pets around you, be aware of where they are. Review your medicines with your doctor. Some medicines can make you feel dizzy. This can increase your chance of falling. Ask your doctor what other things that you can do to help prevent falls. This information is not intended to replace advice given to you by your health care provider. Make sure you discuss any questions you have with your health care provider. Document Released: 02/06/2009 Document Revised: 09/18/2015 Document Reviewed: 05/17/2014 Elsevier Interactive Patient Education  2017 [employer].

## 2021-09-23 NOTE — Progress Notes (Signed)
Virtual Visit via Telephone Note  I connected with  LAURAL [name] on 09/23/21 at  1:45 PM EDT by telephone and verified that I am speaking with the correct person using two identifiers.  Medicare Annual Wellness visit completed telephonically due to Covid-19 pandemic.   Persons participating in this call: This Health Coach and this patient.   Location: Patient: Home Provider: Office   I discussed the limitations, risks, security and privacy concerns of performing an evaluation and management service by telephone and the availability of in person appointments. The patient expressed understanding and agreed to proceed.  Unable to perform video visit due to video visit attempted and failed and/or patient does not have video capability.   Some vital signs may be absent or patient reported.   Willette Brace, LPN   Subjective:   Gabrielle [name] is a 61 y.o. female who presents for Medicare Annual (Subsequent) preventive examination.  Review of Systems     Cardiac Risk Factors include: advanced age (>69mn, >>49women);sedentary lifestyle     Objective:    There were no vitals filed for this visit. There is no height or weight on file to calculate BMI.     09/23/2021    1:34 PM 08/31/2021    9:14 AM 10/16/2020    1:32 PM 08/27/2020   12:48 PM 06/04/2014    8:19 AM  Advanced Directives  Does Patient Have a Medical Advance Directive? Yes No Yes Yes Yes  Type of AAcademic librarianLiving will;Healthcare Power of AInksterLiving will [place]indsorLiving will Living will;Healthcare Power of Attorney  Does patient want to make changes to medical advance directive?  No - Patient declined   No - Patient declined  Copy of [place] Chart?    No - copy requested No - copy requested  Would patient like information on creating a medical advance directive?  No - Patient declined       Current  Medications (verified) Outpatient Encounter Medications as of 09/23/2021  Medication Sig   ALPRAZolam (XANAX) 0.5 MG tablet Take 1 tablet (0.5 mg total) by mouth 3 (three) times daily.   aspirin EC 81 MG tablet Take by mouth.   atorvastatin (LIPITOR) 80 MG tablet TAKE 1 TABLET EVERY DAY   EPINEPHrine (EPIPEN JR) 0.15 MG/0.3ML injection    ezetimibe (ZETIA) 10 MG tablet Take 1 tablet (10 mg total) by mouth daily.   famotidine (PEPCID) 20 MG tablet Take by mouth.   LamoTRIgine (LAMICTAL XR) 300 MG TB24 24 hour tablet 1 tab po qd   metoprolol succinate (TOPROL-XL) 25 MG 24 hr tablet TAKE 1/2 TABLET ONE TIME DAILY   Multiple Vitamins-Minerals (MULTIVITAMIN ADULT PO) Take by mouth daily.   omeprazole (PRILOSEC) 40 MG capsule Take 1 capsule (40 mg total) by mouth daily.   PARoxetine (PAXIL) 40 MG tablet Take 1 tablet (40 mg total) by mouth daily.   hydrOXYzine (ATARAX) 25 MG tablet Take 25 mg by mouth 3 (three) times daily. (Patient not taking: Reported on 08/21/2021)   nitroGLYCERIN (NITROSTAT) 0.4 MG SL tablet Place under the tongue. (Patient not taking: Reported on 08/21/2021)   zolpidem (AMBIEN) 10 MG tablet Take 1 tablet (10 mg total) by mouth at bedtime as needed for sleep.   [DISCONTINUED] predniSONE (DELTASONE) 20 MG tablet Take 20 mg by mouth 2 (two) times daily. (Patient not taking: Reported on 08/31/2021)   No facility-administered encounter medications on  file as of 09/23/2021.    Allergies (verified) Rocephin [ceftriaxone] and Sulfa antibiotics   History: Past Medical History:  Diagnosis Date   Anxiety and depression    CAD (coronary artery disease)    MI + stent 2018   Colon cancer screening 04/2019   Cologuard NEG.  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   NHL-L peritoneal mesenteric mass-->no evidence of aggressive lymphoma as of 02/2021 hem/onc f/u   Dyspepsia    GERD (gastroesophageal reflux disease)    History of myocardial infarction 2018   Hx of  migraines    remote past   Hypercholesterolemia    NHL (non-Hodgkin's lymphoma) ([place]) 2016   left peritoneal mesonteric mass   Palpitations 2015   no monitoring was done-->resolved spontaneously   Past Surgical History:  Procedure Laterality Date   biopsy  2016   Initial bx needle; then lab abd (done in [facility]) done to get further bx, exp lap--complications--sepsis/hemorrhage--memory/cognitive difficulties since that surgery/hospitalization--details somewhat cloudy from pt's info.   BREAST ENHANCEMENT SURGERY  2003   Heart Stent  2018   Prox LAD ([place]).   INCISIONAL HERNIA REPAIR  2017   w/mesh   TONSILLECTOMY     TRANSTHORACIC ECHOCARDIOGRAM  09/2013   2015 ALL NORMAL.  2020 normal per pt report (Drr. Metta Clines with [facility] [place]   WISDOM TOOTH EXTRACTION     Family History  Problem Relation Age of Onset   Heart disease Mother    Emphysema Mother    Hypertension Mother    Stroke Mother    Depression Mother    Heart failure Mother    Hypertension Brother    Depression Brother    Stroke Maternal Aunt    Depression Maternal Aunt    CAD Maternal Aunt    Colon cancer Other    Social History   Socioeconomic History   Marital status: Married    Spouse name: Not on file   Number of children: 0   Years of education: Not on file   Highest education level: Not on file  Occupational History   Occupation: Disability  Tobacco Use   Smoking status: Never   Smokeless tobacco: Never  Vaping Use   Vaping Use: Never used  Substance and Sexual Activity   Alcohol use: No   Drug use: No   Sexual activity: Yes    Birth control/protection: Condom  Other Topics Concern   Not on file  Social History Narrative   Married, no children.   [place]ocated from [place] about 10/2018.   Educ: BA [employer], grad w/honors.   [employer] and others prior to disability.   Disabled due to bipolar, depression 2012.   No tob, alc.   No drug use.   Social Determinants of Health    Financial Resource Strain: Low Risk    Difficulty of Paying Living Expenses: Not hard at all  Food Insecurity: No Food Insecurity   Worried About Charity fundraiser in the Last Year: Never true   [place] in the Last Year: Never true  Transportation Needs: No Transportation Needs   Lack of Transportation (Medical): No   Lack of Transportation (Non-Medical): No  Physical Activity: Inactive   Days of Exercise per Week: 0 days   Minutes of Exercise per Session: 0 min  Stress: No Stress Concern Present   Feeling of Stress : Not at all  Social Connections: Moderately Isolated   Frequency  of Communication with Friends and Family: More than three times a week   Frequency of Social Gatherings with Friends and Family: Once a week   Attends Religious Services: Never   Marine scientist or Organizations: No   Attends Music therapist: Never   Marital Status: Married    Tobacco Counseling Counseling given: Not Answered   Clinical Intake:  Pre-visit preparation completed: Yes  Pain : No/denies pain     BMI - recorded: 25.2 Nutritional Status: BMI 25 -29 Overweight Nutritional Risks: None Diabetes: No  How often do you need to have someone help you when you read instructions, pamphlets, or other written materials from your doctor or pharmacy?: 1 - Never  Diabetic?no  Interpreter Needed?: No  Information entered by :: Charlott Rakes, LPN   Activities of Daily Living    09/23/2021    1:35 PM  In your present state of health, do you have any difficulty performing the following activities:  Hearing? 0  Vision? 0  Difficulty concentrating or making decisions? 0  Walking or climbing stairs? 0  Dressing or bathing? 0  Doing errands, shopping? 0  Preparing Food and eating ? N  Using the Toilet? N  In the past six months, have you accidently leaked urine? N  Do you have problems with loss of bowel control? N  Managing your Medications? N  Managing  your Finances? N  Housekeeping or managing your Housekeeping? N    Patient Care Team: Tammi Sou, MD as PCP - General (Family Medicine) Louretta Shorten, MD as Consulting Physician (Obstetrics and Gynecology) Dudley Major, MD as Referring Physician (Internal Medicine) Burnell Blanks, MD as Consulting Physician (Cardiology) Milus Banister, MD as Consulting Physician (Gastroenterology) Algie Coffer, Mindi Slicker, RN as Oncology Nurse Navigator Benay Spice Izola Price, MD as Consulting Physician (Oncology)  Indicate any recent Medical Services you may have received from other than [facility] providers in the past year (date may be approximate).     Assessment:   This is a routine wellness examination for Krisann.  Hearing/Vision screen Hearing Screening - Comments:: Pt denies nay hearing issues  Vision Screening - Comments:: [facility] for [facility] eye exams   Dietary issues and exercise activities discussed: Current Exercise Habits: The patient does not participate in regular exercise at present   Goals Addressed             This Visit's Progress    Patient Stated       None at this time        Depression Screen    09/23/2021    1:33 PM 08/27/2020   12:51 PM 04/02/2019    1:35 PM  PHQ 2/9 Scores  PHQ - 2 Score 0 1 0    Fall Risk    09/23/2021    1:35 PM 08/27/2020   12:50 PM  [place] in the past year? 0 0  Number falls in past yr: 0 0  Injury with Fall? 0 0  Risk for fall due to : Impaired vision   Follow up Falls prevention discussed Falls prevention discussed    FALL RISK PREVENTION PERTAINING TO THE HOME:  Any stairs in or around the home? No  If so, are there any without handrails? No  Home free of loose throw rugs in walkways, pet beds, electrical cords, etc? Yes  Adequate lighting in your home to reduce risk of falls? Yes   ASSISTIVE DEVICES  UTILIZED TO PREVENT FALLS:  Life alert? No  Use of a cane, walker or w/c? No   Grab bars in the bathroom? No  Shower chair or bench in shower? Yes Elevated toilet seat or a handicapped toilet? No   TIMED UP AND GO:  Was the test performed? No .   Cognitive Function:        09/23/2021    1:37 PM  6CIT Screen  What Year? 0 points  What month? 0 points  What time? 0 points  Count back from 20 0 points  Months in reverse 0 points  Repeat phrase 0 points  Total Score 0 points    Immunizations Immunization History  Administered Date(s) Administered   Moderna Sars-Covid-2 Vaccination 08/01/2019, 08/28/2019, 11/10/2020   Zoster Recombinat (Shingrix) 07/03/2019    TDAP status: Due, Education has been provided regarding the importance of this vaccine. Advised may receive this vaccine at local pharmacy or Health Dept. Aware to provide a copy of the vaccination record if obtained from local pharmacy or Health Dept. Verbalized acceptance and understanding.  Flu Vaccine status: Declined, Education has been provided regarding the importance of this vaccine but patient still declined. Advised may receive this vaccine at local pharmacy or Health Dept. Aware to provide a copy of the vaccination record if obtained from local pharmacy or Health Dept. Verbalized acceptance and understanding.  Pneumococcal vaccine status: Declined,  Education has been provided regarding the importance of this vaccine but patient still declined. Advised may receive this vaccine at local pharmacy or Health Dept. Aware to provide a copy of the vaccination record if obtained from local pharmacy or Health Dept. Verbalized acceptance and understanding.   Covid-19 vaccine status: Completed vaccines  Qualifies for Shingles Vaccine? Yes   Zostavax completed Yes   Shingrix Completed?: Yes  Screening Tests Health Maintenance  Topic Date Due   Hepatitis C Screening  Never done   TETANUS/TDAP  Never done   COLONOSCOPY (Pts 45-53yr Insurance coverage will need to be confirmed)  Never done    Zoster Vaccines- Shingrix (2 of 2) 08/28/2019   COVID-19 Vaccine (4 - Booster for Moderna series) 01/05/2021   HIV Screening  11/28/2021 (Originally 07/29/1975)   MAMMOGRAM  09/24/2022 (Originally 03/04/2021)   INFLUENZA VACCINE  11/24/2021   PAP SMEAR-Modifier  06/19/2024   HPV VSeldoviaMaintenance  Health Maintenance Due  Topic Date Due   Hepatitis C Screening  Never done   TETANUS/TDAP  Never done   COLONOSCOPY (Pts 45-484yrInsurance coverage will need to be confirmed)  Never done   Zoster Vaccines- Shingrix (2 of 2) 08/28/2019   COVID-19 Vaccine (4 - Booster for Moderna series) 01/05/2021    Colorectal cancer screening: Type of screening: Cologuard. Completed 05/02/19. Repeat every as directed years  Mammogram status: No longer required due to pt preference.     Additional Screening:  Hepatitis C Screening: does qualify;  Vision Screening: Recommended annual ophthalmology exams for early detection of glaucoma and other disorders of the eye. Is the patient up to date with their annual eye exam?  Yes  Who is the provider or what is the name of the office in which the patient attends annual eye exams? [facility] If pt is not established with a provider, would they like to be referred to a provider to establish care? No .   Dental Screening: Recommended annual dental exams for proper oral hygiene  Community Resource Referral / Chronic Care  Management: CRR required this visit?  No   CCM required this visit?  No      Plan:     I have personally reviewed and noted the following in the patient's chart:   Medical and social history Use of alcohol, tobacco or illicit drugs  Current medications and supplements including opioid prescriptions.  Functional ability and status Nutritional status Physical activity Advanced directives List of other physicians Hospitalizations, surgeries, and ER visits in previous 12 months Vitals Screenings to  include cognitive, depression, and falls Referrals and appointments  In addition, I have reviewed and discussed with patient certain preventive protocols, quality metrics, and best practice recommendations. A written personalized care plan for preventive services as well as general preventive health recommendations were provided to patient.     Willette Brace, LPN   11/08/9676   Nurse Notes: None

## 2021-09-25 DIAGNOSIS — N952 Postmenopausal atrophic vaginitis: Secondary | ICD-10-CM | POA: Diagnosis not present

## 2021-09-25 DIAGNOSIS — N959 Unspecified menopausal and perimenopausal disorder: Secondary | ICD-10-CM | POA: Diagnosis not present

## 2021-09-28 ENCOUNTER — Inpatient Hospital Stay: Payer: Medicare HMO | Attending: Oncology

## 2021-09-28 ENCOUNTER — Ambulatory Visit (HOSPITAL_BASED_OUTPATIENT_CLINIC_OR_DEPARTMENT_OTHER)
Admission: RE | Admit: 2021-09-28 | Discharge: 2021-09-28 | Disposition: A | Discharge status: Home / Self Care | Payer: Medicare HMO | Source: Ambulatory Visit | Attending: Nurse Practitioner | Admitting: Nurse Practitioner

## 2021-09-28 ENCOUNTER — Encounter (HOSPITAL_BASED_OUTPATIENT_CLINIC_OR_DEPARTMENT_OTHER): Payer: Self-pay

## 2021-09-28 ENCOUNTER — Encounter: Payer: Self-pay | Admitting: *Deleted

## 2021-09-28 DIAGNOSIS — Z8572 Personal history of non-Hodgkin lymphomas: Secondary | ICD-10-CM | POA: Insufficient documentation

## 2021-09-28 DIAGNOSIS — F419 Anxiety disorder, unspecified: Secondary | ICD-10-CM | POA: Insufficient documentation

## 2021-09-28 DIAGNOSIS — C8190 Hodgkin lymphoma, unspecified, unspecified site: Secondary | ICD-10-CM | POA: Diagnosis not present

## 2021-09-28 DIAGNOSIS — F32A Depression, unspecified: Secondary | ICD-10-CM | POA: Insufficient documentation

## 2021-09-28 DIAGNOSIS — C8580 Other specified types of non-Hodgkin lymphoma, unspecified site: Secondary | ICD-10-CM | POA: Diagnosis not present

## 2021-09-28 DIAGNOSIS — E785 Hyperlipidemia, unspecified: Secondary | ICD-10-CM | POA: Insufficient documentation

## 2021-09-28 DIAGNOSIS — K5909 Other constipation: Secondary | ICD-10-CM | POA: Insufficient documentation

## 2021-09-28 LAB — CBC WITH DIFFERENTIAL (CANCER CENTER ONLY)
Abs Immature Granulocytes: 0.01 10*3/uL (ref 0.00–0.07)
Basophils Absolute: 0 10*3/uL (ref 0.0–0.1)
Basophils Relative: 1 %
Eosinophils Absolute: 0.1 10*3/uL (ref 0.0–0.5)
Eosinophils Relative: 2 %
HCT: 42.8 % (ref 36.0–46.0)
Hemoglobin: 14 g/dL (ref 12.0–15.0)
Immature Granulocytes: 0 %
Lymphocytes Relative: 39 %
Lymphs Abs: 2.5 10*3/uL (ref 0.7–4.0)
MCH: 32.6 pg (ref 26.0–34.0)
MCHC: 32.7 g/dL (ref 30.0–36.0)
MCV: 99.5 fL (ref 80.0–100.0)
Monocytes Absolute: 0.5 10*3/uL (ref 0.1–1.0)
Monocytes Relative: 9 %
Neutro Abs: 3.2 10*3/uL (ref 1.7–7.7)
Neutrophils Relative %: 49 %
Platelet Count: 203 10*3/uL (ref 150–400)
RBC: 4.3 MIL/uL (ref 3.87–5.11)
RDW: 12.5 % (ref 11.5–15.5)
WBC Count: 6.4 10*3/uL (ref 4.0–10.5)
nRBC: 0 % (ref 0.0–0.2)

## 2021-09-28 LAB — CMP (CANCER CENTER ONLY)
ALT: 39 U/L (ref 0–44)
AST: 39 U/L (ref 15–41)
Albumin: 4.5 g/dL (ref 3.5–5.0)
Alkaline Phosphatase: 108 U/L (ref 38–126)
Anion gap: 9 (ref 5–15)
BUN: 16 mg/dL (ref 8–23)
CO2: 30 mmol/L (ref 22–32)
Calcium: 9.8 mg/dL (ref 8.9–10.3)
Chloride: 103 mmol/L (ref 98–111)
Creatinine: 0.97 mg/dL (ref 0.44–1.00)
GFR, Estimated: >60 mL/min (ref >60)
Glucose, Bld: 93 mg/dL (ref 70–99)
Potassium: 5.2 mmol/L — ABNORMAL HIGH (ref 3.5–5.1)
Sodium: 142 mmol/L (ref 135–145)
Total Bilirubin: 0.5 mg/dL (ref 0.3–1.2)
Total Protein: 7 g/dL (ref 6.5–8.1)

## 2021-09-28 LAB — LACTATE DEHYDROGENASE: LDH: 215 U/L — ABNORMAL HIGH (ref 98–192)

## 2021-09-28 MED ORDER — IOHEXOL 300 MG/ML  SOLN
100.0000 mL | Freq: Once | INTRAMUSCULAR | Status: AC | PRN
Start: 2021-09-28 — End: 2021-09-28
  Administered 2021-09-28: 80 mL via INTRAVENOUS

## 2021-09-28 NOTE — Progress Notes (Signed)
MD notified of slightly high K+ of 5.2 today noted on pre-CT lab today. Does not take K+. Specimen showed slight hemolysis. No need to re-collect, per Dr. Benay Spice.

## 2021-10-01 ENCOUNTER — Other Ambulatory Visit: Payer: Medicare HMO

## 2021-10-01 ENCOUNTER — Inpatient Hospital Stay (HOSPITAL_BASED_OUTPATIENT_CLINIC_OR_DEPARTMENT_OTHER): Payer: Medicare HMO | Admitting: Oncology

## 2021-10-01 ENCOUNTER — Encounter: Payer: Self-pay | Admitting: *Deleted

## 2021-10-01 VITALS — BP 121/76 | HR 66 | Temp 98.2°F | Resp 16 | Wt 143.2 lb

## 2021-10-01 DIAGNOSIS — C8580 Other specified types of non-Hodgkin lymphoma, unspecified site: Secondary | ICD-10-CM

## 2021-10-01 DIAGNOSIS — F32A Depression, unspecified: Secondary | ICD-10-CM | POA: Diagnosis not present

## 2021-10-01 DIAGNOSIS — F419 Anxiety disorder, unspecified: Secondary | ICD-10-CM | POA: Diagnosis not present

## 2021-10-01 DIAGNOSIS — E785 Hyperlipidemia, unspecified: Secondary | ICD-10-CM | POA: Diagnosis not present

## 2021-10-01 DIAGNOSIS — Z8572 Personal history of non-Hodgkin lymphomas: Secondary | ICD-10-CM | POA: Diagnosis not present

## 2021-10-01 DIAGNOSIS — K5909 Other constipation: Secondary | ICD-10-CM | POA: Diagnosis not present

## 2021-10-01 NOTE — Progress Notes (Signed)
Faxed referral order and office note to [facility] for referral to Dr. Ardis Hughs at [phone].

## 2021-10-01 NOTE — Progress Notes (Signed)
[place] OFFICE PROGRESS NOTE   Diagnosis: Non-Hodgkin's lymphoma  INTERVAL HISTORY:   Ms. Dexheimer returns as scheduled.  She feels well.  No fever, night sweats, or anorexia.  She had an anaphylactic reaction to ceftriaxone given for treatment of urinary tract infection.  She was seen in the emergency room at [facility] and treated for the anaphylactic reaction. She complains of chronic constipation.  She had a bowel movement after drinking the CT contrast Monday.  Objective:  Vital signs in last 24 hours:  Blood pressure 121/76, pulse 66, temperature 98.2 F (36.8 C), resp. rate 16, weight 143 lb 3.2 oz (65 kg), last menstrual period 10/20/2011, SpO2 97 %.    Lymphatics: No cervical, supraclavicular, axillary, or inguinal nodes Resp: Lungs clear bilaterally Cardio: Regular rate and rhythm GI: No mass, nontender, no hepatosplenomegaly Vascular: No leg edema   Lab Results:  Lab Results  Component Value Date   WBC 6.4 09/28/2021   HGB 14.0 09/28/2021   HCT 42.8 09/28/2021   MCV 99.5 09/28/2021   PLT 203 09/28/2021   NEUTROABS 3.2 09/28/2021    CMP  Lab Results  Component Value Date   NA 142 09/28/2021   K 5.2 (H) 09/28/2021   CL 103 09/28/2021   CO2 30 09/28/2021   GLUCOSE 93 09/28/2021   BUN 16 09/28/2021   CREATININE 0.97 09/28/2021   CALCIUM 9.8 09/28/2021   PROT 7.0 09/28/2021   ALBUMIN 4.5 09/28/2021   AST 39 09/28/2021   ALT 39 09/28/2021   ALKPHOS 108 09/28/2021   BILITOT 0.5 09/28/2021   GFRNONAA >60 09/28/2021   GFRAA  06/20/2010    >60        The eGFR has been calculated using the MDRD equation. This calculation has not been validated in all clinical situations. eGFR's persistently <60 mL/min signify possible Chronic Kidney Disease.    No results found for: "CEA1", "CEA", "TWS568", "CA125"  Lab Results  Component Value Date   INR 0.98 06/04/2014   LABPROT 13.1 06/04/2014    Imaging:  CT Abdomen Pelvis W  Contrast  Result Date: 09/28/2021 CLINICAL DATA:  Follow-up non-Hodgkin's lymphoma. * Tracking Code: BO * EXAM: CT ABDOMEN AND PELVIS WITH CONTRAST TECHNIQUE: Multidetector CT imaging of the abdomen and pelvis was performed using the standard protocol following bolus administration of intravenous contrast. RADIATION DOSE REDUCTION: This exam was performed according to the departmental dose-optimization program which includes automated exposure control, adjustment of the mA and/or kV according to patient size and/or use of iterative reconstruction technique. CONTRAST:  56m OMNIPAQUE IOHEXOL 300 MG/ML  SOLN COMPARISON:  10/16/2020. FINDINGS: Lower chest: Mild linear atelectasis at the lung bases. Bilateral breast prosthetics noted. Hepatobiliary: No focal hepatic lesion. No biliary duct dilatation. Common bile duct is normal. Pancreas: Pancreas is normal. No ductal dilatation. No pancreatic inflammation. Spleen: Spleen is normal volume. Adrenals/urinary tract: Adrenal glands and kidneys are normal. The ureters and bladder normal. Stomach/Bowel: Stomach, small bowel, appendix, and cecum are normal. The colon and rectosigmoid colon are normal. In the LEFT central mesentery again demonstrated several enlarged lymph nodes. Example node measures 14 mm short axis (image 37/series 2) compared to 15 mm. Example node measuring 10 mm( image 35/2) compares to 9 mm. 11 mm node ( image 40) compares to 10 mm. There is interval clearing of the haziness within the mesentery seen on comparison exam. No new mesenteric adenopathy. Vascular/Lymphatic: Abdominal aorta is normal caliber. No periportal or retroperitoneal adenopathy. No pelvic adenopathy. No inguinal  adenopathy Reproductive: Uterus and adnexa unremarkable. Other: No free fluid. Musculoskeletal: No aggressive osseous lesion. IMPRESSION: 1. No interval change in mesenteric adenopathy compared to CT exam 1 year prior. 2. Interval clearing of haziness within the mesentery seen  on comparison exam. 3. No new or enlarged lymph nodes. 4. Normal volume spleen. Electronically Signed   By: Suzy Bouchard M.D.   On: 09/28/2021 12:01    Medications: I have reviewed the patient's current medications.   Assessment/Plan: Non-Hodgkin lymphoma, B-cell follicular center cell type CT abdomen/pelvis 05/29/2014-left abdominal intraperitoneal mass, subcentimeter liver lesions felt to be benign, no adenopathy CT-guided biopsy of left abdominal mass 06/04/2014-non-Hodgkin B-cell lymphoma, follicular center cell type, high-grade favored, abundance of large lymphocytes admixed with small angulated lymphocytes Relocated to [facility], underwent an open diagnostic biopsy 07/16/2014-pathology from mesenteric lymph node biopsies revealed sclerosing follicular B-cell lymphoma, CD20 positive, CD10 positive, predominantly diffuse architecture, low-grade.  Flow cytometry revealed a monoclonal B-cell population, lambda light chain restricted, 12.9% large cells, c-Myc rearrangement not detected, KI-67 20% She was not treated for lymphoma  PET 12/12/2014-markedly decreased size of hypermetabolic mesenteric mass, measuring 2.3 x 3.8 cm compared to 4.8 x 7.9 cm with SUV 7.2 compared to 96.2 hypermetabolic activity in the region of the retroperitoneal reflection no longer evident, hypermetabolic soft tissue track extending from the bladder dome to the periumbilical region PET 05/28/9796-XQJJHERDEY noted hypermetabolic mesenteric mass no longer visualized, no hypermetabolic lymphadenopathy or extranodal disease, midline subincisional fluid collection measuring 3 x 6 cm CT abdomen/pelvis 10/16/2020-compared to 05/29/2014 CT, no change in left lateral liver hypodensity, abnormal mesenteric adenopathy in the left abdomen at the site of a previous mesenteric mass, representative node measuring 15 mm, majority of nodes are 5-10 mm, no retroperitoneal or upper abdominal adenopathy, no pelvic sidewall or  inguinal adenopathy CT abdomen/pelvis 09/28/2021-no change in mesenteric adenopathy, clearing of haziness in the mesentery, no new lymph nodes 2.  coronary artery disease 3.  Anxiety and depression 4.  Hyperlipidemia 5.  Anaphylactic reaction to ceftriaxone 08/18/2021    Disposition: Ms. Luckenbaugh appears stable.  There is no clinical or radiologic evidence for progression of the lymphoma.  I doubt her chronic constipation is related to lymphoma.  We will refer her to gastroenterology for recommendations regarding management of the constipation.  She has tried multiple over-the-counter medications without improvement.  The LDH is mildly elevated.  This is likely a nonspecific finding.  She will return for an office visit in 6 months.  We will check the LDH when she is here in 6 months.  She will call for new symptoms.  I reviewed the CT findings and images with Ms. Rentfrow and her husband.  Betsy Coder, MD  10/01/2021  9:49 AM

## 2021-10-05 ENCOUNTER — Ambulatory Visit: Payer: Medicare HMO | Admitting: Oncology

## 2021-10-19 ENCOUNTER — Encounter: Payer: Self-pay | Admitting: Physician Assistant

## 2021-10-20 ENCOUNTER — Other Ambulatory Visit: Payer: Self-pay | Admitting: Family Medicine

## 2021-11-13 NOTE — Progress Notes (Deleted)
11/13/2021 [facility] [id number] May 21, 1960  Referring provider: Tammi Sou, MD Primary GI doctor: Dr. Ardis Hughs  ASSESSMENT AND PLAN:   There are no diagnoses linked to this encounter.   Patient Care Team: Tammi Sou, MD as PCP - General (Family Medicine) Louretta Shorten, MD as Consulting Physician (Obstetrics and Gynecology) Dudley Major, MD as Referring Physician (Internal Medicine) Burnell Blanks, MD as Consulting Physician (Cardiology) Milus Banister, MD as Consulting Physician (Gastroenterology) Algie Coffer, Mindi Slicker, RN as Oncology Nurse Navigator Ladell Pier, MD as Consulting Physician (Oncology)  HISTORY OF PRESENT ILLNESS: 61 y.o. female with a past medical history of anxiety, depression, coronary disease status post PCI 2018, GERD, diffuse large B-cell lymphoma diagnosed 2016, follows with Dr. Benay Spice and others listed below presents for evaluation of chronic constipation.   05/28/2014 office visit with PA Cecille Rubin here for abdominal bloating, dyspepsia and need for colorectal screening.  Had CT abdomen pelvis which diagnosed non-Hodgkin's lymphoma 09/28/2021 CT abdomen pelvis with contrast follow-up in non-Hodgkin's lymphoma no interval change in mesenteric adenopathy, interval clearing of haziness within the mesentery seen on comparison exam, no new or enlarged lymph nodes.  Normal volume spleen. 05/02/2019 Cologuard negative recall 04/2022   Current Medications:    Current Outpatient Medications (Cardiovascular):    atorvastatin (LIPITOR) 80 MG tablet, TAKE 1 TABLET EVERY DAY   ezetimibe (ZETIA) 10 MG tablet, Take 1 tablet (10 mg total) by mouth daily.   metoprolol succinate (TOPROL-XL) 25 MG 24 hr tablet, TAKE 1/2 TABLET ONE TIME DAILY   nitroGLYCERIN (NITROSTAT) 0.4 MG SL tablet, Place under the tongue. (Patient not taking: Reported on 08/21/2021)   Current Outpatient Medications (Analgesics):    aspirin EC 81 MG tablet, Take by  mouth.   Current Outpatient Medications (Other):    ALPRAZolam (XANAX) 0.5 MG tablet, Take 1 tablet (0.5 mg total) by mouth 3 (three) times daily.   famotidine (PEPCID) 20 MG tablet, Take by mouth. (Patient not taking: Reported on 10/01/2021)   hydrOXYzine (ATARAX) 25 MG tablet, Take 25 mg by mouth 3 (three) times daily. (Patient not taking: Reported on 08/21/2021)   LamoTRIgine (LAMICTAL XR) 300 MG TB24 24 hour tablet, 1 tab po qd   Multiple Vitamins-Minerals (MULTIVITAMIN ADULT PO), Take by mouth daily.   omeprazole (PRILOSEC) 40 MG capsule, Take 1 capsule (40 mg total) by mouth daily.   PARoxetine (PAXIL) 40 MG tablet, Take 1 tablet (40 mg total) by mouth daily.   zolpidem (AMBIEN) 10 MG tablet, Take 1 tablet (10 mg total) by mouth at bedtime as needed for sleep.  Medical History:  Past Medical History:  Diagnosis Date   Anxiety and depression    CAD (coronary artery disease)    MI + stent 2018   Colon cancer screening 04/2019   Cologuard NEG.  Repeat 04/2022   Diffuse large B cell lymphoma ([place]) 2018   NHL-L peritoneal mesenteric mass-->no evidence of aggressive lymphoma as of 02/2021 hem/onc f/u   Dyspepsia    GERD (gastroesophageal reflux disease)    History of myocardial infarction 2018   Hx of migraines    remote past   Hypercholesterolemia    NHL (non-Hodgkin's lymphoma) ([place]) 2016   left peritoneal mesonteric mass   Palpitations 2015   no monitoring was done-->resolved spontaneously   Allergies:  Allergies  Allergen Reactions   Rocephin [Ceftriaxone] Anaphylaxis, Hives and Shortness Of Breath    Facial numbness   Sulfa Antibiotics Itching and Rash  Surgical History:  She  has a past surgical history that includes Wisdom tooth extraction; Tonsillectomy; Breast enhancement surgery (2003); Heart Stent (2018); biopsy (2016); transthoracic echocardiogram (09/2013); and Incisional hernia repair (2017). Family History:  Her family history includes CAD in her maternal  aunt; Colon cancer in an other family member; Depression in her brother, maternal aunt, and mother; Emphysema in her mother; Heart disease in her mother; Heart failure in her mother; Hypertension in her brother and mother; Stroke in her maternal aunt and mother. Social History:   reports that she has never smoked. She has never used smokeless tobacco. She reports that she does not drink alcohol and does not use drugs.  REVIEW OF SYSTEMS  : All other systems reviewed and negative except where noted in the History of Present Illness.   PHYSICAL EXAM: LMP 10/20/2011  General:   Pleasant, well developed female in no acute distress Head:   Normocephalic and atraumatic. Eyes:  {sclerae:26738},conjunctive {conjuctiva:26739}  Heart:   {HEART EXAM HEM/ONC:21750} Pulm:  Clear anteriorly; no wheezing Abdomen:   {BlankSingle:19197::"Distended","Ridged","Soft"}, {BlankSingle:19197::"Flat","Obese","Non-distended"} AB, {BlankSingle:19197::"Absent","Hyperactive, tinkling","Hypoactive","Sluggish","Active"} bowel sounds. {actendernessAB:27319} tenderness {anatomy; site abdomen:5010}. {BlankMultiple:19196::"Without guarding","With guarding","Without rebound","With rebound"}, No organomegaly appreciated. Rectal: {acrectalexam:27461} Extremities:  {With/Without:304960234} edema. Msk: Symmetrical without gross deformities. Peripheral pulses intact.  Neurologic:  Alert and  oriented x4;  No focal deficits.  Skin:   Dry and intact without significant lesions or rashes. Psychiatric:  Cooperative. Normal mood and affect.    Vladimir Crofts, PA-C 12:47 PM

## 2021-11-16 ENCOUNTER — Ambulatory Visit: Payer: Medicare HMO | Admitting: Physician Assistant

## 2021-11-23 ENCOUNTER — Ambulatory Visit: Payer: Medicare HMO | Admitting: Physician Assistant

## 2021-12-02 ENCOUNTER — Other Ambulatory Visit: Payer: Self-pay | Admitting: Family Medicine

## 2021-12-04 ENCOUNTER — Other Ambulatory Visit: Payer: Self-pay

## 2021-12-04 MED ORDER — PAROXETINE HCL 40 MG PO TABS: 40.0000 mg | ORAL_TABLET | Freq: Every day | ORAL | 0 refills | Status: AC

## 2021-12-04 MED ORDER — LAMOTRIGINE ER 300 MG PO TB24: ORAL_TABLET | ORAL | 0 refills | Status: DC

## 2021-12-04 NOTE — Telephone Encounter (Signed)
Pharmacy called stating that the lamictal XR is going to cost the pt over $90. If pt gets regular lamictal BID it will only cost around $8. Rx is pending

## 2021-12-07 MED ORDER — LAMOTRIGINE 150 MG PO TABS: 150.0000 mg | ORAL_TABLET | Freq: Two times a day (BID) | ORAL | 1 refills | Status: DC

## 2021-12-10 ENCOUNTER — Other Ambulatory Visit: Payer: Self-pay

## 2021-12-25 ENCOUNTER — Telehealth: Payer: Self-pay | Admitting: Family Medicine

## 2021-12-25 MED ORDER — LAMOTRIGINE 150 MG PO TABS: 150.0000 mg | ORAL_TABLET | Freq: Two times a day (BID) | ORAL | 0 refills | Status: DC

## 2021-12-25 NOTE — Telephone Encounter (Signed)
Patient was called and advised '200mg'$  dose is not on her current medication list. She explained that it should be because she has been previously on it and during her last visit in February, her and the provider agreed to it. I agreed it was noted for her to continue on this dose until next follow up. A recent rx was sent on 8/14 for '150mg'$  bid.  She mentioned that she needed this medication ('200mg'$ ) sent today to local pharmacy for 7-10 days and a new prescription and for [place] to contact [facility] to straighten this out. She was made aware provider approval needed for dose change but we can not guarantee a specific time for this. She was dissatisfied with this and hung up the first time.   She called back less than 10 minutes later stating she no longer needed [place] to complete a 7-10 day prescription to the local pharmacy. She got it taken care of. She just needed [place] to complete a prescription for '200mg'$  dose to the mail order. I asked what dose she received from the pharmacy to clarify. She asked if there was someone else she could speak to because I was not comprehending. At first, she declined to share the dosage but yelled stated '200mg'$  and hoped this call was recorded and documented. She would be informing provider of this conversation at her next visit. Advised she could speak with my manager about it.

## 2021-12-25 NOTE — Telephone Encounter (Signed)
Contacted patient per patient request. Patient stated she is very frustrated with CMA as she is trying to get her Lamictal '200mg'$  prescription sent to the pharmacy that was prescribed to her. Patient was informed that per notes her Lamictal is total '300mg'$  per day. Patient stated that she made provider aware verbally that she needed to be on '200mg'$  during a previous office visit a few months back due to insurance purposes. Patient was informed that a message had been sent to provider inquiring. Patient stated she does not know why CMA was not understanding what she was asking. Assured patient that CMA understood and sent a message to the provider on behalf of her request. CMA informed that patient consistently raised her voice and made comments regarding intellectual ability to process patient request. While attempting to address the comments patient made to [place], patient proceeded to say " I am not going to be lectured by you, just send my medication to the pharmacy" and hung up.   Provider has been made aware.

## 2021-12-25 NOTE — Telephone Encounter (Signed)
Pt called and states that she needs 200 mg of Lamotrigine sent into her local pharmacy [facility] in [place]. She states that she also needs it sent to [facility] but she will be traveling next week and needs for pick up at the local pharmacy.

## 2021-12-27 ENCOUNTER — Other Ambulatory Visit: Payer: Self-pay | Admitting: Family Medicine

## 2021-12-29 ENCOUNTER — Encounter: Payer: Self-pay | Admitting: Family Medicine

## 2022-01-04 DIAGNOSIS — G309 Alzheimer's disease, unspecified: Secondary | ICD-10-CM | POA: Diagnosis not present

## 2022-01-04 DIAGNOSIS — F339 Major depressive disorder, recurrent, unspecified: Secondary | ICD-10-CM | POA: Diagnosis not present

## 2022-01-04 DIAGNOSIS — C8590 Non-Hodgkin lymphoma, unspecified, unspecified site: Secondary | ICD-10-CM | POA: Diagnosis not present

## 2022-01-04 DIAGNOSIS — F0280 Dementia in other diseases classified elsewhere without behavioral disturbance: Secondary | ICD-10-CM | POA: Diagnosis not present

## 2022-01-04 DIAGNOSIS — Z299 Encounter for prophylactic measures, unspecified: Secondary | ICD-10-CM | POA: Diagnosis not present

## 2022-01-04 DIAGNOSIS — E7800 Pure hypercholesterolemia, unspecified: Secondary | ICD-10-CM | POA: Diagnosis not present

## 2022-01-04 DIAGNOSIS — Z6824 Body mass index (BMI) 24.0-24.9, adult: Secondary | ICD-10-CM | POA: Diagnosis not present

## 2022-01-04 DIAGNOSIS — J339 Nasal polyp, unspecified: Secondary | ICD-10-CM | POA: Diagnosis not present

## 2022-01-22 ENCOUNTER — Other Ambulatory Visit: Payer: Self-pay | Admitting: Family Medicine

## 2022-01-29 ENCOUNTER — Other Ambulatory Visit: Payer: Self-pay | Admitting: Family Medicine

## 2022-02-24 DIAGNOSIS — Z2821 Immunization not carried out because of patient refusal: Secondary | ICD-10-CM | POA: Diagnosis not present

## 2022-02-24 DIAGNOSIS — Z7189 Other specified counseling: Secondary | ICD-10-CM | POA: Diagnosis not present

## 2022-02-24 DIAGNOSIS — Z6825 Body mass index (BMI) 25.0-25.9, adult: Secondary | ICD-10-CM | POA: Diagnosis not present

## 2022-02-24 DIAGNOSIS — Z299 Encounter for prophylactic measures, unspecified: Secondary | ICD-10-CM | POA: Diagnosis not present

## 2022-02-24 DIAGNOSIS — R569 Unspecified convulsions: Secondary | ICD-10-CM | POA: Diagnosis not present

## 2022-02-24 DIAGNOSIS — G4700 Insomnia, unspecified: Secondary | ICD-10-CM | POA: Diagnosis not present

## 2022-02-24 DIAGNOSIS — Z1339 Encounter for screening examination for other mental health and behavioral disorders: Secondary | ICD-10-CM | POA: Diagnosis not present

## 2022-02-24 DIAGNOSIS — I10 Essential (primary) hypertension: Secondary | ICD-10-CM | POA: Diagnosis not present

## 2022-02-24 DIAGNOSIS — Z1331 Encounter for screening for depression: Secondary | ICD-10-CM | POA: Diagnosis not present

## 2022-02-24 DIAGNOSIS — Z0000 Encounter for general adult medical examination without abnormal findings: Secondary | ICD-10-CM | POA: Diagnosis not present

## 2022-02-24 DIAGNOSIS — E7800 Pure hypercholesterolemia, unspecified: Secondary | ICD-10-CM | POA: Diagnosis not present

## 2022-03-05 DIAGNOSIS — Z955 Presence of coronary angioplasty implant and graft: Secondary | ICD-10-CM | POA: Diagnosis not present

## 2022-03-05 DIAGNOSIS — E785 Hyperlipidemia, unspecified: Secondary | ICD-10-CM | POA: Diagnosis not present

## 2022-03-05 DIAGNOSIS — Z7982 Long term (current) use of aspirin: Secondary | ICD-10-CM | POA: Diagnosis not present

## 2022-03-05 DIAGNOSIS — Z79899 Other long term (current) drug therapy: Secondary | ICD-10-CM | POA: Diagnosis not present

## 2022-03-05 DIAGNOSIS — I2510 Atherosclerotic heart disease of native coronary artery without angina pectoris: Secondary | ICD-10-CM | POA: Diagnosis not present

## 2022-03-05 DIAGNOSIS — I252 Old myocardial infarction: Secondary | ICD-10-CM | POA: Diagnosis not present

## 2022-03-05 DIAGNOSIS — Z882 Allergy status to sulfonamides status: Secondary | ICD-10-CM | POA: Diagnosis not present

## 2022-03-16 DIAGNOSIS — E7800 Pure hypercholesterolemia, unspecified: Secondary | ICD-10-CM | POA: Diagnosis not present

## 2022-03-16 DIAGNOSIS — Z79899 Other long term (current) drug therapy: Secondary | ICD-10-CM | POA: Diagnosis not present

## 2022-03-16 DIAGNOSIS — Z299 Encounter for prophylactic measures, unspecified: Secondary | ICD-10-CM | POA: Diagnosis not present

## 2022-03-16 DIAGNOSIS — I10 Essential (primary) hypertension: Secondary | ICD-10-CM | POA: Diagnosis not present

## 2022-03-16 DIAGNOSIS — F339 Major depressive disorder, recurrent, unspecified: Secondary | ICD-10-CM | POA: Diagnosis not present

## 2022-03-16 DIAGNOSIS — Z0000 Encounter for general adult medical examination without abnormal findings: Secondary | ICD-10-CM | POA: Diagnosis not present

## 2022-03-16 DIAGNOSIS — R5383 Other fatigue: Secondary | ICD-10-CM | POA: Diagnosis not present

## 2022-03-16 DIAGNOSIS — Z6825 Body mass index (BMI) 25.0-25.9, adult: Secondary | ICD-10-CM | POA: Diagnosis not present

## 2022-04-01 ENCOUNTER — Inpatient Hospital Stay: Payer: Medicare HMO | Attending: Oncology

## 2022-04-01 ENCOUNTER — Inpatient Hospital Stay: Payer: Medicare HMO | Admitting: Oncology

## 2022-04-01 VITALS — BP 122/58 | HR 80 | Temp 98.1°F | Resp 16 | Wt 142.8 lb

## 2022-04-01 DIAGNOSIS — E785 Hyperlipidemia, unspecified: Secondary | ICD-10-CM | POA: Insufficient documentation

## 2022-04-01 DIAGNOSIS — F419 Anxiety disorder, unspecified: Secondary | ICD-10-CM | POA: Insufficient documentation

## 2022-04-01 DIAGNOSIS — F32A Depression, unspecified: Secondary | ICD-10-CM | POA: Insufficient documentation

## 2022-04-01 DIAGNOSIS — C8580 Other specified types of non-Hodgkin lymphoma, unspecified site: Secondary | ICD-10-CM

## 2022-04-01 DIAGNOSIS — Z8572 Personal history of non-Hodgkin lymphomas: Secondary | ICD-10-CM | POA: Diagnosis not present

## 2022-04-01 LAB — BASIC METABOLIC PANEL - CANCER CENTER ONLY
Anion gap: 10 (ref 5–15)
BUN: 19 mg/dL (ref 8–23)
CO2: 29 mmol/L (ref 22–32)
Calcium: 9.2 mg/dL (ref 8.9–10.3)
Chloride: 103 mmol/L (ref 98–111)
Creatinine: 0.83 mg/dL (ref 0.44–1.00)
GFR, Estimated: >60 mL/min (ref >60)
Glucose, Bld: 79 mg/dL (ref 70–99)
Potassium: 4.2 mmol/L (ref 3.5–5.1)
Sodium: 142 mmol/L (ref 135–145)

## 2022-04-01 LAB — CBC WITH DIFFERENTIAL (CANCER CENTER ONLY)
Abs Immature Granulocytes: 0.01 10*3/uL (ref 0.00–0.07)
Basophils Absolute: 0 10*3/uL (ref 0.0–0.1)
Basophils Relative: 0 %
Eosinophils Absolute: 0.1 10*3/uL (ref 0.0–0.5)
Eosinophils Relative: 2 %
HCT: 40.7 % (ref 36.0–46.0)
Hemoglobin: 13.5 g/dL (ref 12.0–15.0)
Immature Granulocytes: 0 %
Lymphocytes Relative: 33 %
Lymphs Abs: 2.2 10*3/uL (ref 0.7–4.0)
MCH: 33.5 pg (ref 26.0–34.0)
MCHC: 33.2 g/dL (ref 30.0–36.0)
MCV: 101 fL — ABNORMAL HIGH (ref 80.0–100.0)
Monocytes Absolute: 0.6 10*3/uL (ref 0.1–1.0)
Monocytes Relative: 9 %
Neutro Abs: 3.7 10*3/uL (ref 1.7–7.7)
Neutrophils Relative %: 56 %
Platelet Count: 197 10*3/uL (ref 150–400)
RBC: 4.03 MIL/uL (ref 3.87–5.11)
RDW: 12.9 % (ref 11.5–15.5)
WBC Count: 6.5 10*3/uL (ref 4.0–10.5)
nRBC: 0 % (ref 0.0–0.2)

## 2022-04-01 LAB — LACTATE DEHYDROGENASE: LDH: 147 U/L (ref 98–192)

## 2022-04-01 NOTE — Progress Notes (Signed)
[place] OFFICE PROGRESS NOTE   Diagnosis: Non-Hodgkin's Ph[place]  INTERVAL HISTORY:   Gabrielle [name] returns as scheduled.  She generally feels well.  No fever or drenching night sweats.  She has occasional mild sweats.  She has noted fullness in the mid upper abdomen for the past few weeks.  No pain.  No difficulty with bowel or bladder function.  Her appetite is slightly diminished.  Objective:  Vital signs in last 24 hours:  Blood pressure (!) 122/58, pulse 80, temperature 98.1 F (36.7 C), temperature source Temporal, resp. rate 16, weight 142 lb 12.8 oz (64.8 kg), last menstrual period 10/20/2011, SpO2 99 %.    Lymphatics: No cervical, supraclavicular, axillary, or inguinal nodes Resp: Lungs clear bilaterally Cardio: Regular rate and rhythm GI: No hepatosplenomegaly, nontender, diffuse firmness at the mid upper abdomen to the left greater than right of the midline scar.  No discrete mass. Vascular: No leg edema  Lab Results:  Lab Results  Component Value Date   WBC 6.5 04/01/2022   HGB 13.5 04/01/2022   HCT 40.7 04/01/2022   MCV 101.0 (H) 04/01/2022   PLT 197 04/01/2022   NEUTROABS 3.7 04/01/2022    CMP  Lab Results  Component Value Date   NA 142 04/01/2022   K 4.2 04/01/2022   CL 103 04/01/2022   CO2 29 04/01/2022   GLUCOSE 79 04/01/2022   BUN 19 04/01/2022   CREATININE 0.83 04/01/2022   CALCIUM 9.2 04/01/2022   PROT 7.0 09/28/2021   ALBUMIN 4.5 09/28/2021   AST 39 09/28/2021   ALT 39 09/28/2021   ALKPHOS 108 09/28/2021   BILITOT 0.5 09/28/2021   GFRNONAA >60 04/01/2022   GFRAA  06/20/2010    >60        The eGFR has been calculated using the MDRD equation. This calculation has not been validated in all clinical situations. eGFR's persistently <60 mL/min signify possible Chronic Kidney Disease.    Medications: I have reviewed the patient's current medications.   Assessment/Plan: Non-Hodgkin lymphoma, B-cell follicular center cell  type CT abdomen/pelvis 05/29/2014-left abdominal intraperitoneal mass, subcentimeter liver lesions felt to be benign, no adenopathy CT-guided biopsy of left abdominal mass 06/04/2014-non-Hodgkin B-cell lymphoma, follicular center cell type, high-grade favored, abundance of large lymphocytes admixed with small angulated lymphocytes Relocated to [facility], underwent an open diagnostic biopsy 07/16/2014-pathology from mesenteric lymph node biopsies revealed sclerosing follicular B-cell lymphoma, CD20 positive, CD10 positive, predominantly diffuse architecture, low-grade.  Flow cytometry revealed a monoclonal B-cell population, lambda light chain restricted, 12.9% large cells, c-Myc rearrangement not detected, KI-67 20% She was not treated for lymphoma  PET 12/12/2014-markedly decreased size of hypermetabolic mesenteric mass, measuring 2.3 x 3.8 cm compared to 4.8 x 7.9 cm with SUV 7.2 compared to 28.7 hypermetabolic activity in the region of the retroperitoneal reflection no longer evident, hypermetabolic soft tissue track extending from the bladder dome to the periumbilical region PET 11/30/7670-[id number] noted hypermetabolic mesenteric mass no longer visualized, no hypermetabolic lymphadenopathy or extranodal disease, midline subincisional fluid collection measuring 3 x 6 cm CT abdomen/pelvis 10/16/2020-compared to 05/29/2014 CT, no change in left lateral liver hypodensity, abnormal mesenteric adenopathy in the left abdomen at the site of a previous mesenteric mass, representative node measuring 15 mm, majority of nodes are 5-10 mm, no retroperitoneal or upper abdominal adenopathy, no pelvic sidewall or inguinal adenopathy CT abdomen/pelvis 09/28/2021-no change in mesenteric adenopathy, clearing of haziness in the mesentery, no new lymph nodes 2.  coronary artery disease 3.  Anxiety and depression 4.  Hyperlipidemia 5.  Anaphylactic reaction to ceftriaxone 08/18/2021    Disposition: [name]  has a history of non-Hodgkin's lymphoma.  Her overall clinical status appears stable.  She has experienced upper abdominal fullness for the past few weeks.  No discrete mass is noted on exam today.  She will contact us if the fullness progresses or if she develops new symptoms. She will be scheduled for an office visit and restaging CT in June.  We will arrange for a CT sooner if she has persistent or progressive abdominal symptoms.  Betsy Coder, MD  04/01/2022  2:53 PM

## 2022-04-14 DIAGNOSIS — H5203 Hypermetropia, bilateral: Secondary | ICD-10-CM | POA: Diagnosis not present

## 2022-05-23 DIAGNOSIS — Z1211 Encounter for screening for malignant neoplasm of colon: Secondary | ICD-10-CM | POA: Diagnosis not present

## 2022-05-23 DIAGNOSIS — Z1212 Encounter for screening for malignant neoplasm of rectum: Secondary | ICD-10-CM | POA: Diagnosis not present

## 2022-05-31 DIAGNOSIS — H52223 Regular astigmatism, bilateral: Secondary | ICD-10-CM | POA: Diagnosis not present

## 2022-05-31 DIAGNOSIS — H524 Presbyopia: Secondary | ICD-10-CM | POA: Diagnosis not present

## 2022-06-02 LAB — COLOGUARD: COLOGUARD: POSITIVE — AB

## 2022-06-07 DIAGNOSIS — D225 Melanocytic nevi of trunk: Secondary | ICD-10-CM | POA: Diagnosis not present

## 2022-06-07 DIAGNOSIS — L821 Other seborrheic keratosis: Secondary | ICD-10-CM | POA: Diagnosis not present

## 2022-06-07 DIAGNOSIS — L820 Inflamed seborrheic keratosis: Secondary | ICD-10-CM | POA: Diagnosis not present

## 2022-06-16 ENCOUNTER — Other Ambulatory Visit: Payer: Self-pay | Admitting: Family Medicine

## 2022-06-18 DIAGNOSIS — F0280 Dementia in other diseases classified elsewhere without behavioral disturbance: Secondary | ICD-10-CM | POA: Diagnosis not present

## 2022-06-18 DIAGNOSIS — G4700 Insomnia, unspecified: Secondary | ICD-10-CM | POA: Diagnosis not present

## 2022-06-18 DIAGNOSIS — Z299 Encounter for prophylactic measures, unspecified: Secondary | ICD-10-CM | POA: Diagnosis not present

## 2022-06-18 DIAGNOSIS — I10 Essential (primary) hypertension: Secondary | ICD-10-CM | POA: Diagnosis not present

## 2022-06-18 DIAGNOSIS — R569 Unspecified convulsions: Secondary | ICD-10-CM | POA: Diagnosis not present

## 2022-06-18 DIAGNOSIS — G309 Alzheimer's disease, unspecified: Secondary | ICD-10-CM | POA: Diagnosis not present

## 2022-06-18 DIAGNOSIS — E7800 Pure hypercholesterolemia, unspecified: Secondary | ICD-10-CM | POA: Diagnosis not present

## 2022-08-26 DIAGNOSIS — F319 Bipolar disorder, unspecified: Secondary | ICD-10-CM | POA: Diagnosis not present

## 2022-08-26 DIAGNOSIS — F419 Anxiety disorder, unspecified: Secondary | ICD-10-CM | POA: Diagnosis not present

## 2022-08-26 DIAGNOSIS — E7800 Pure hypercholesterolemia, unspecified: Secondary | ICD-10-CM | POA: Diagnosis not present

## 2022-08-26 DIAGNOSIS — I10 Essential (primary) hypertension: Secondary | ICD-10-CM | POA: Diagnosis not present

## 2022-08-26 DIAGNOSIS — K219 Gastro-esophageal reflux disease without esophagitis: Secondary | ICD-10-CM | POA: Diagnosis not present

## 2022-08-26 DIAGNOSIS — R569 Unspecified convulsions: Secondary | ICD-10-CM | POA: Diagnosis not present

## 2022-08-26 DIAGNOSIS — Z299 Encounter for prophylactic measures, unspecified: Secondary | ICD-10-CM | POA: Diagnosis not present

## 2022-09-15 DIAGNOSIS — Z1331 Encounter for screening for depression: Secondary | ICD-10-CM | POA: Diagnosis not present

## 2022-09-15 DIAGNOSIS — I10 Essential (primary) hypertension: Secondary | ICD-10-CM | POA: Diagnosis not present

## 2022-09-15 DIAGNOSIS — Z1339 Encounter for screening examination for other mental health and behavioral disorders: Secondary | ICD-10-CM | POA: Diagnosis not present

## 2022-09-15 DIAGNOSIS — Z0000 Encounter for general adult medical examination without abnormal findings: Secondary | ICD-10-CM | POA: Diagnosis not present

## 2022-09-15 DIAGNOSIS — Z66 Do not resuscitate: Secondary | ICD-10-CM | POA: Diagnosis not present

## 2022-09-15 DIAGNOSIS — Z7189 Other specified counseling: Secondary | ICD-10-CM | POA: Diagnosis not present

## 2022-10-01 ENCOUNTER — Ambulatory Visit (HOSPITAL_BASED_OUTPATIENT_CLINIC_OR_DEPARTMENT_OTHER)
Admission: RE | Admit: 2022-10-01 | Discharge: 2022-10-01 | Disposition: A | Discharge status: Home / Self Care | Payer: Medicare HMO | Source: Ambulatory Visit | Attending: Oncology | Admitting: Oncology

## 2022-10-01 ENCOUNTER — Inpatient Hospital Stay: Payer: Medicare HMO | Admitting: Oncology

## 2022-10-01 ENCOUNTER — Inpatient Hospital Stay: Payer: Medicare HMO | Attending: Oncology

## 2022-10-01 VITALS — BP 128/60 | HR 67 | Temp 98.1°F | Resp 18 | Ht 63.0 in | Wt 143.8 lb

## 2022-10-01 DIAGNOSIS — Z8572 Personal history of non-Hodgkin lymphomas: Secondary | ICD-10-CM | POA: Insufficient documentation

## 2022-10-01 DIAGNOSIS — C8580 Other specified types of non-Hodgkin lymphoma, unspecified site: Secondary | ICD-10-CM | POA: Diagnosis not present

## 2022-10-01 DIAGNOSIS — E785 Hyperlipidemia, unspecified: Secondary | ICD-10-CM | POA: Insufficient documentation

## 2022-10-01 DIAGNOSIS — K5909 Other constipation: Secondary | ICD-10-CM | POA: Insufficient documentation

## 2022-10-01 DIAGNOSIS — I2510 Atherosclerotic heart disease of native coronary artery without angina pectoris: Secondary | ICD-10-CM | POA: Insufficient documentation

## 2022-10-01 DIAGNOSIS — K3189 Other diseases of stomach and duodenum: Secondary | ICD-10-CM | POA: Diagnosis not present

## 2022-10-01 DIAGNOSIS — F32A Depression, unspecified: Secondary | ICD-10-CM | POA: Insufficient documentation

## 2022-10-01 DIAGNOSIS — F419 Anxiety disorder, unspecified: Secondary | ICD-10-CM | POA: Diagnosis not present

## 2022-10-01 DIAGNOSIS — C8590 Non-Hodgkin lymphoma, unspecified, unspecified site: Secondary | ICD-10-CM | POA: Diagnosis not present

## 2022-10-01 LAB — BASIC METABOLIC PANEL - CANCER CENTER ONLY
Anion gap: 7 (ref 5–15)
BUN: 16 mg/dL (ref 8–23)
CO2: 29 mmol/L (ref 22–32)
Calcium: 9.6 mg/dL (ref 8.9–10.3)
Chloride: 102 mmol/L (ref 98–111)
Creatinine: 0.76 mg/dL (ref 0.44–1.00)
GFR, Estimated: >60 mL/min (ref >60)
Glucose, Bld: 102 mg/dL — ABNORMAL HIGH (ref 70–99)
Potassium: 4.9 mmol/L (ref 3.5–5.1)
Sodium: 138 mmol/L (ref 135–145)

## 2022-10-01 LAB — CBC WITH DIFFERENTIAL (CANCER CENTER ONLY)
Abs Immature Granulocytes: 0.02 10*3/uL (ref 0.00–0.07)
Basophils Absolute: 0 10*3/uL (ref 0.0–0.1)
Basophils Relative: 0 %
Eosinophils Absolute: 0.1 10*3/uL (ref 0.0–0.5)
Eosinophils Relative: 2 %
HCT: 43.1 % (ref 36.0–46.0)
Hemoglobin: 14.5 g/dL (ref 12.0–15.0)
Immature Granulocytes: 0 %
Lymphocytes Relative: 31 %
Lymphs Abs: 2.5 10*3/uL (ref 0.7–4.0)
MCH: 32.8 pg (ref 26.0–34.0)
MCHC: 33.6 g/dL (ref 30.0–36.0)
MCV: 97.5 fL (ref 80.0–100.0)
Monocytes Absolute: 0.6 10*3/uL (ref 0.1–1.0)
Monocytes Relative: 8 %
Neutro Abs: 4.9 10*3/uL (ref 1.7–7.7)
Neutrophils Relative %: 59 %
Platelet Count: 183 10*3/uL (ref 150–400)
RBC: 4.42 MIL/uL (ref 3.87–5.11)
RDW: 12.9 % (ref 11.5–15.5)
WBC Count: 8.2 10*3/uL (ref 4.0–10.5)
nRBC: 0 % (ref 0.0–0.2)

## 2022-10-01 LAB — LACTATE DEHYDROGENASE: LDH: 162 U/L (ref 98–192)

## 2022-10-01 MED ORDER — IOHEXOL 300 MG/ML  SOLN
100.0000 mL | Freq: Once | INTRAMUSCULAR | Status: AC | PRN
  Administered 2022-10-01: 80 mL via INTRAVENOUS

## 2022-10-01 NOTE — Progress Notes (Signed)
[facility] OFFICE PROGRESS NOTE   Diagnosis: Non-Hodgkin's lymphoma  INTERVAL HISTORY:   Gabrielle [name] returns as scheduled.  She feels well.  Good appetite.  No fever.  No palpable lymph nodes.  She reports hot flashes since going through menopause.  No abdominal pain or nausea.  She has chronic constipation.  Objective:  Vital signs in last 24 hours:  Blood pressure 128/60, pulse 67, temperature 98.1 F (36.7 C), temperature source Oral, resp. rate 18, height 5\' 3"  (1.6 m), weight 143 lb 12.8 oz (65.2 kg), last menstrual period 10/20/2011, SpO2 98 %.   Lymphatics: No cervical, supraclavicular, axillary, or inguinal nodes Resp: Lungs clear bilaterally Cardio: Regular rate and rhythm GI: No mass, nontender, no hepatosplenomegaly Vascular: No leg edema    Lab Results:  Lab Results  Component Value Date   WBC 8.2 10/01/2022   HGB 14.5 10/01/2022   HCT 43.1 10/01/2022   MCV 97.5 10/01/2022   PLT 183 10/01/2022   NEUTROABS 4.9 10/01/2022    CMP  Lab Results  Component Value Date   NA 138 10/01/2022   K 4.9 10/01/2022   CL 102 10/01/2022   CO2 29 10/01/2022   GLUCOSE 102 (H) 10/01/2022   BUN 16 10/01/2022   CREATININE 0.76 10/01/2022   CALCIUM 9.6 10/01/2022   PROT 7.0 09/28/2021   ALBUMIN 4.5 09/28/2021   AST 39 09/28/2021   ALT 39 09/28/2021   ALKPHOS 108 09/28/2021   BILITOT 0.5 09/28/2021   GFRNONAA >60 10/01/2022   GFRAA  06/20/2010    >60        The eGFR has been calculated using the MDRD equation. This calculation has not been validated in all clinical situations. eGFR's persistently <60 mL/min signify possible Chronic Kidney Disease.     Medications: I have reviewed the patient's current medications.   Assessment/Plan: Non-Hodgkin lymphoma, B-cell follicular center cell type CT abdomen/pelvis 05/29/2014-left abdominal intraperitoneal mass, subcentimeter liver lesions felt to be benign, no adenopathy CT-guided biopsy of left  abdominal mass 06/04/2014-non-Hodgkin B-cell lymphoma, follicular center cell type, high-grade favored, abundance of large lymphocytes admixed with small angulated lymphocytes Relocated to [facility], underwent an open diagnostic biopsy 07/16/2014-pathology from mesenteric lymph node biopsies revealed sclerosing follicular B-cell lymphoma, CD20 positive, CD10 positive, predominantly diffuse architecture, low-grade.  Flow cytometry revealed a monoclonal B-cell population, lambda light chain restricted, 12.9% large cells, c-Myc rearrangement not detected, KI-67 20% She was not treated for lymphoma  PET 12/12/2014-markedly decreased size of hypermetabolic mesenteric mass, measuring 2.3 x 3.8 cm compared to 4.8 x 7.9 cm with SUV 7.2 compared to 18.9 hypermetabolic activity in the region of the retroperitoneal reflection no longer evident, hypermetabolic soft tissue track extending from the bladder dome to the periumbilical region PET 07/31/2015-previously noted hypermetabolic mesenteric mass no longer visualized, no hypermetabolic lymphadenopathy or extranodal disease, midline subincisional fluid collection measuring 3 x 6 cm CT abdomen/pelvis 10/16/2020-compared to 05/29/2014 CT, no change in left lateral liver hypodensity, abnormal mesenteric adenopathy in the left abdomen at the site of a previous mesenteric mass, representative node measuring 15 mm, majority of nodes are 5-10 mm, no retroperitoneal or upper abdominal adenopathy, no pelvic sidewall or inguinal adenopathy CT abdomen/pelvis 09/28/2021-no change in mesenteric adenopathy, clearing of haziness in the mesentery, no new lymph nodes CT abdomen/pelvis 10/01/2022-no change in mesenteric adenopathy, prominent stool throughout the colon 2.  coronary artery disease 3.  Anxiety and depression 4.  Hyperlipidemia 5.  Anaphylactic reaction to ceftriaxone 08/18/2021  Disposition: [name] appears stable.  There is no clinical or radiologic  evidence of progressive lymphoma.  She appears asymptomatic from lymphoma.  The plan is to continue observation.  She will return for an office visit in 8 months. She has chronic constipation.  She will begin a stool softener and MiraLAX.  She will follow-up with Dr. Sherryll Burger if the constipation does not improve.  Thornton Papas, MD  10/01/2022  12:47 PM

## 2022-10-11 DIAGNOSIS — N9089 Other specified noninflammatory disorders of vulva and perineum: Secondary | ICD-10-CM | POA: Diagnosis not present

## 2022-11-08 ENCOUNTER — Encounter: Payer: Medicare HMO | Admitting: Plastic Surgery

## 2022-11-29 ENCOUNTER — Institutional Professional Consult (permissible substitution): Payer: Medicare HMO | Admitting: Plastic Surgery

## 2022-12-15 DIAGNOSIS — I10 Essential (primary) hypertension: Secondary | ICD-10-CM | POA: Diagnosis not present

## 2022-12-15 DIAGNOSIS — Z299 Encounter for prophylactic measures, unspecified: Secondary | ICD-10-CM | POA: Diagnosis not present

## 2022-12-15 DIAGNOSIS — R5383 Other fatigue: Secondary | ICD-10-CM | POA: Diagnosis not present

## 2022-12-15 DIAGNOSIS — E349 Endocrine disorder, unspecified: Secondary | ICD-10-CM | POA: Diagnosis not present

## 2022-12-17 IMAGING — CT CT ABD-PELV W/ CM
2 of 5 series · 15 of 46 positions shown, 17 images · IV contrast (omnipaque)
Comparison: CT 05/29/2014

CLINICAL DATA: Bowel obstruction suspected

Pain and diarrhea.  Patient with history of Hodgkin's lymphoma
EXAM:
CT ABDOMEN AND PELVIS WITH CONTRAST
TECHNIQUE: Multidetector CT imaging of the abdomen and pelvis was performed
using the standard protocol following bolus administration of
intravenous contrast.
CONTRAST:  80mL OMNIPAQUE IOHEXOL 300 MG/ML  SOLN

[Series 2: abd pel w · axial · 0.80mm/px · z∈[+869,+1264]mm · 12 of 89 slices shown, 14 images]
[im 5/89  soft-tissue]
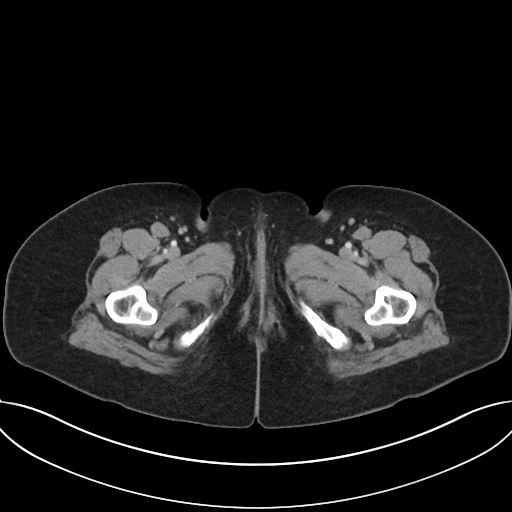
[im 5/89  bone]
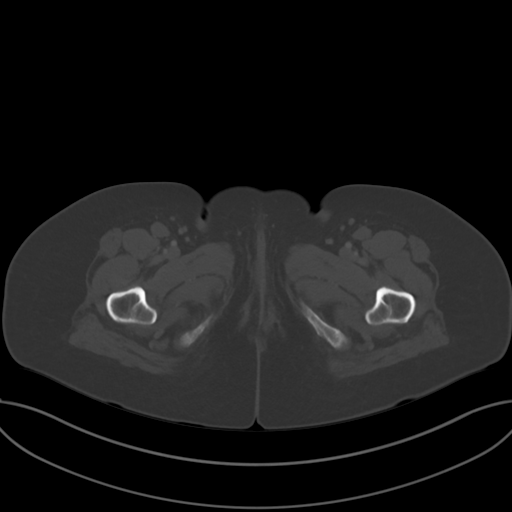
[im 14/89  soft-tissue]
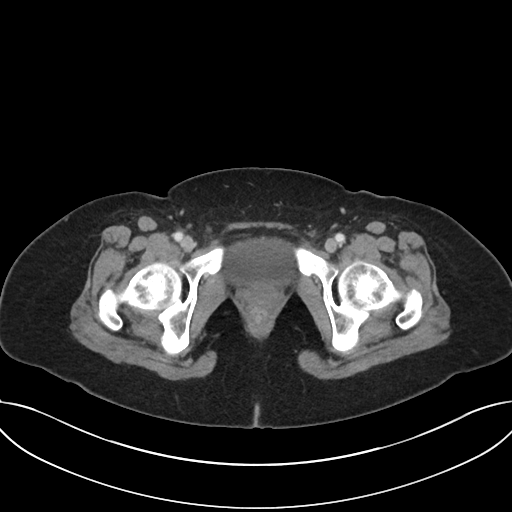
[im 19/89  soft-tissue]
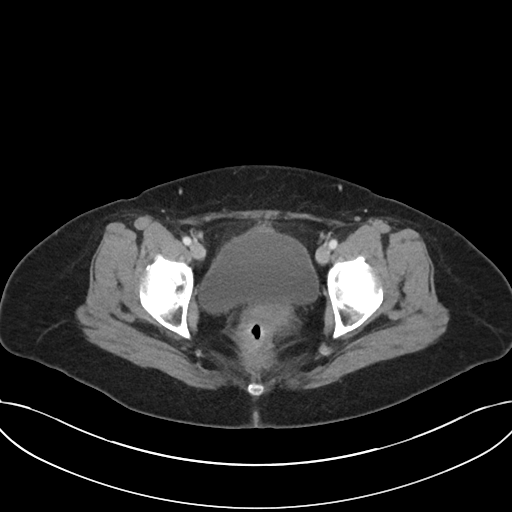
[im 28/89  soft-tissue]
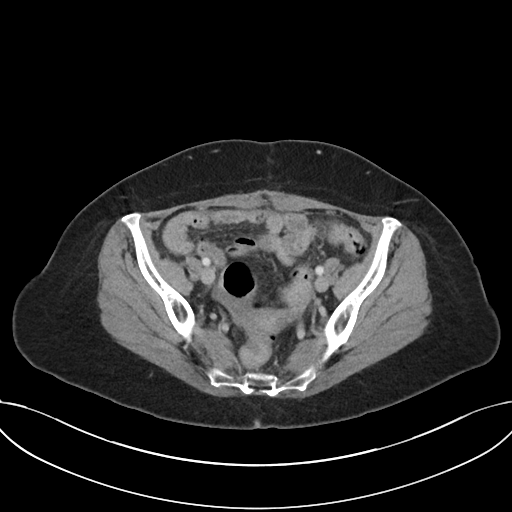
[im 33/89  soft-tissue]
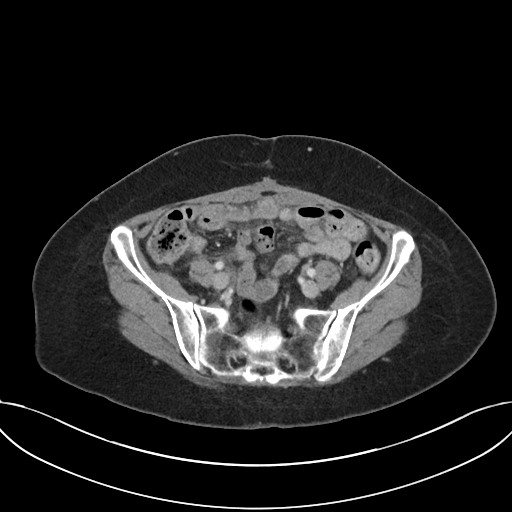
[im 42/89  soft-tissue]
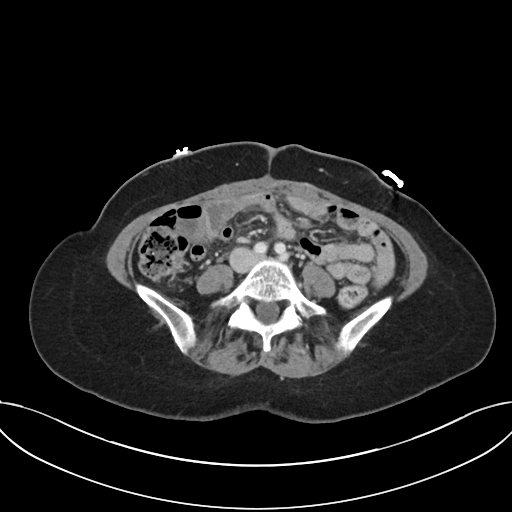
[im 47/89  soft-tissue]
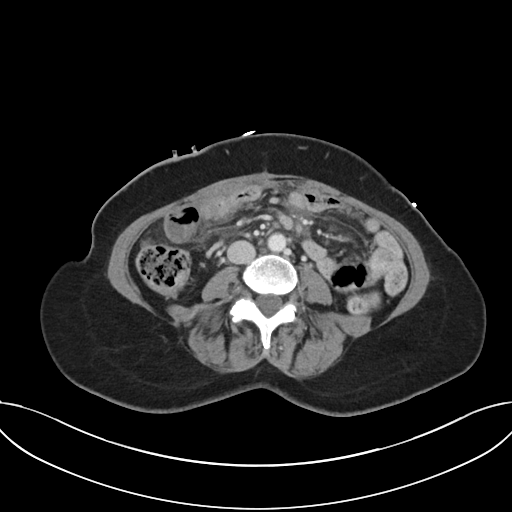
[im 56/89  soft-tissue]
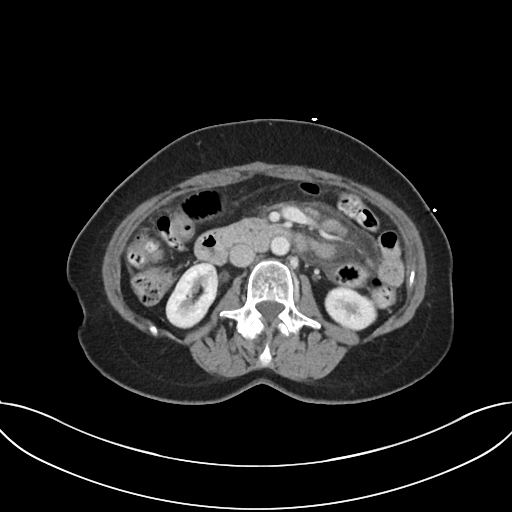
[im 61/89  soft-tissue]
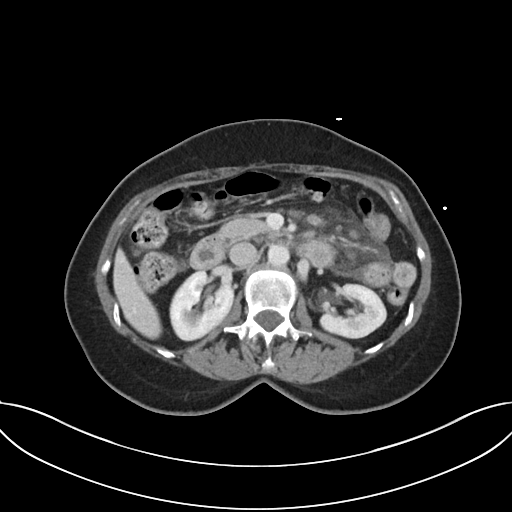
[im 61/89  bone]
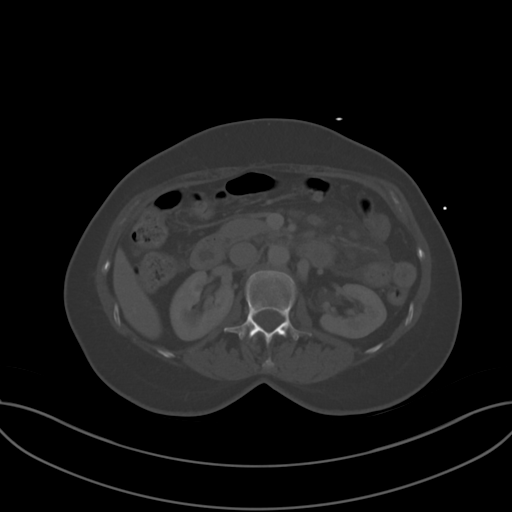
[im 70/89  soft-tissue]
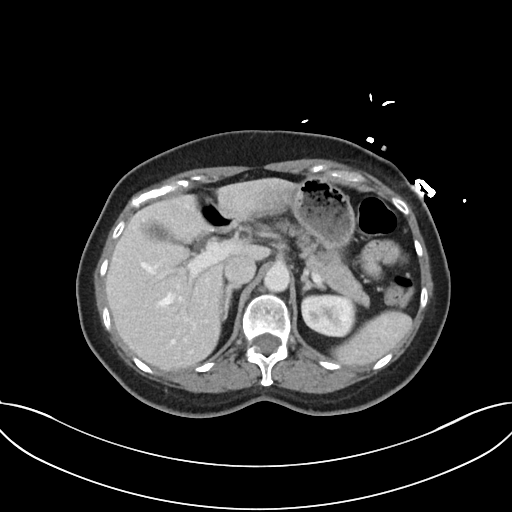
[im 75/89  soft-tissue]
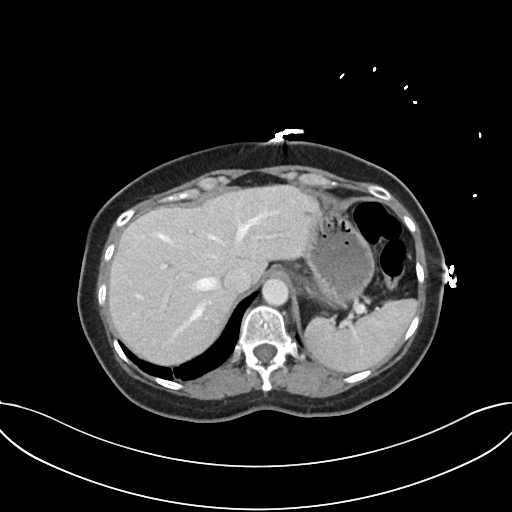
[im 84/89  soft-tissue]
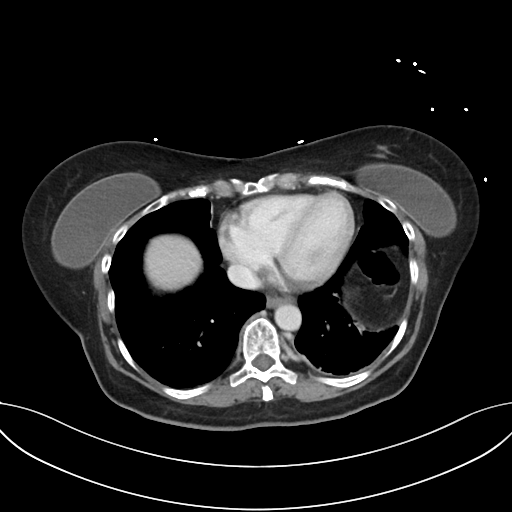

[Series 5: coronal · coronal · 0.78mm/px · 3 of 93 slices shown]
[im 31/93  soft-tissue]
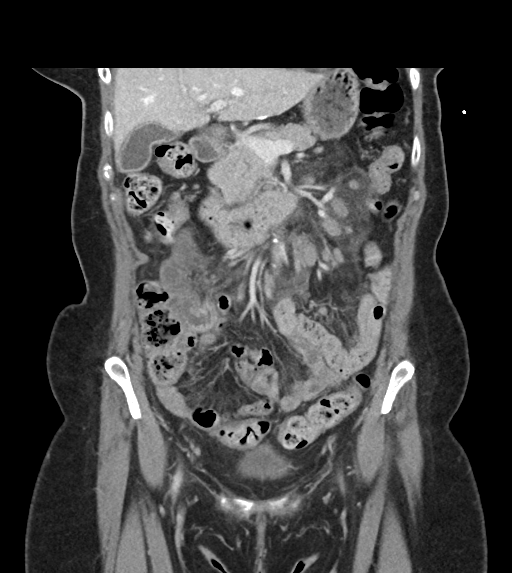
[im 41/93  soft-tissue]
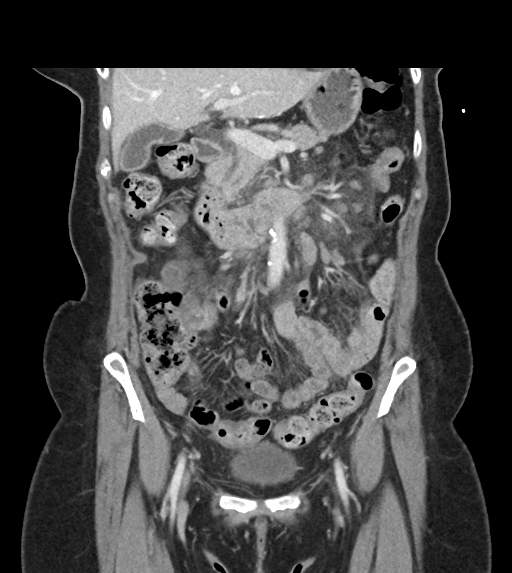
[im 52/93  soft-tissue]
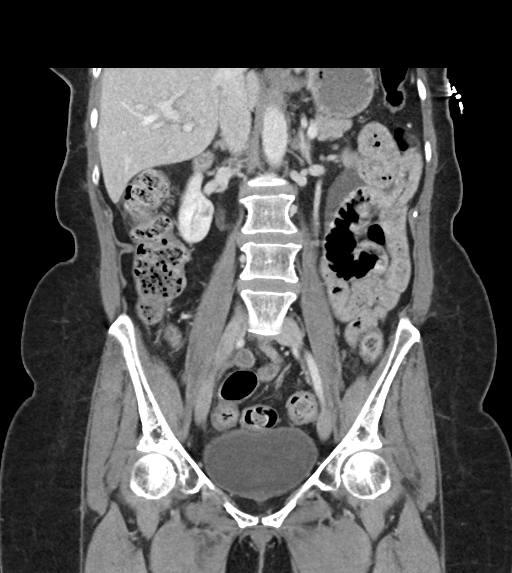

[15 of 46 positions shown; findings below may reference images not displayed]

FINDINGS: Lower chest: Linear atelectasis within both lower lobes. Trace left
pleural effusion. Bilateral breast implants. Normal heart size. No
pericardial effusion.

Hepatobiliary: Subcentimeter low-density in the far lateral left
lobe of the liver not significantly changed from prior exam, too
small to characterize but likely cyst. No new hepatic abnormality.
No calcified gallstone or pericholecystic fat stranding. No biliary
dilatation.

Pancreas: No ductal dilatation or inflammation. No evidence of
pancreatic mass.

Spleen: Normal in size maximal dimension 9 cm. No focal abnormality.

Adrenals/Urinary Tract: Normal adrenal glands. No hydronephrosis or
perinephric edema. Homogeneous renal enhancement with symmetric
excretion on delayed phase imaging. Urinary bladder is
physiologically distended without wall thickening.

Stomach/Bowel: No bowel obstruction. Partially distended stomach.
Equivocal wall thickening of the third portion of the duodenum. No
other small bowel wall thickening or inflammation. Occasional
fluid-filled small bowel that are not abnormally dilated nor
inflamed. Small volume of stool throughout the colon. No colonic
wall thickening or inflammatory change. Appendix not definitively
visualized. No appendicitis.

Vascular/Lymphatic: There is abnormal mesenteric adenopathy in the
left abdomen at site of previous mesenteric mass. Representative
lymph node measures 15 mm, series 2, image 35. Majority of the
additional nodes are 5-10 mm short axis dimension. There is
associated mesenteric edema. There is a separate right central
mesenteric node measuring 8 mm, series 2, image 46. No
retroperitoneal or upper abdominal adenopathy. No pelvic sidewall
adenopathy. No inguinal adenopathy. Normal caliber abdominal aorta
with mild atherosclerosis. Patent portal vein and mesenteric
vessels.

Reproductive: Uterus and bilateral adnexa are unremarkable.

Other: No ascites or free fluid. No free air. No evidence of omental
thickening.

Musculoskeletal: There are no acute or suspicious osseous
abnormalities.
IMPRESSION: 1. No bowel obstruction.
2. Abnormal mesenteric adenopathy in the left abdomen at site of
previous mesenteric mass. The largest lymph node measures 15 mm
short axis dimension. Majority of the additional nodes are 5-10 mm
short axis dimension. There is associated mesenteric edema. Given
history of prior lymphoma, findings may represent recurrent disease.
No interval exams from 9571 CT to assess for interval change. This
may represent patient's baseline, however cannot be confirmed in the
absence of interval imaging. If no interval imaging is available,
recommend oncologic referral and potential PET characterization.
3. Equivocal wall thickening of the third portion of the duodenum,
can be seen with duodenitis.
4. Trace left pleural effusion.

Aortic Atherosclerosis ([id number]).

## 2023-01-10 DIAGNOSIS — E538 Deficiency of other specified B group vitamins: Secondary | ICD-10-CM | POA: Diagnosis not present

## 2023-01-10 DIAGNOSIS — R635 Abnormal weight gain: Secondary | ICD-10-CM | POA: Diagnosis not present

## 2023-01-10 DIAGNOSIS — L680 Hirsutism: Secondary | ICD-10-CM | POA: Diagnosis not present

## 2023-01-10 DIAGNOSIS — R4589 Other symptoms and signs involving emotional state: Secondary | ICD-10-CM | POA: Diagnosis not present

## 2023-01-10 DIAGNOSIS — L659 Nonscarring hair loss, unspecified: Secondary | ICD-10-CM | POA: Diagnosis not present

## 2023-01-10 DIAGNOSIS — E7800 Pure hypercholesterolemia, unspecified: Secondary | ICD-10-CM | POA: Diagnosis not present

## 2023-01-10 DIAGNOSIS — I10 Essential (primary) hypertension: Secondary | ICD-10-CM | POA: Diagnosis not present

## 2023-01-10 DIAGNOSIS — K219 Gastro-esophageal reflux disease without esophagitis: Secondary | ICD-10-CM | POA: Diagnosis not present

## 2023-01-10 DIAGNOSIS — R5382 Chronic fatigue, unspecified: Secondary | ICD-10-CM | POA: Diagnosis not present

## 2023-01-12 DIAGNOSIS — I509 Heart failure, unspecified: Secondary | ICD-10-CM

## 2023-01-12 HISTORY — DX: Heart failure, unspecified: I50.9

## 2023-01-13 DIAGNOSIS — I10 Essential (primary) hypertension: Secondary | ICD-10-CM | POA: Diagnosis not present

## 2023-01-13 DIAGNOSIS — E7800 Pure hypercholesterolemia, unspecified: Secondary | ICD-10-CM | POA: Diagnosis not present

## 2023-01-13 DIAGNOSIS — R7303 Prediabetes: Secondary | ICD-10-CM | POA: Diagnosis not present

## 2023-01-31 ENCOUNTER — Telehealth: Payer: Self-pay | Admitting: Internal Medicine

## 2023-01-31 NOTE — Telephone Encounter (Signed)
She needs an appt  - husband texted me - He used to work in [employer]  I have an opening 10/17 at 830 see if she can take that - or offer a 7 day hold spot 10/15

## 2023-02-01 ENCOUNTER — Other Ambulatory Visit: Payer: Self-pay

## 2023-02-01 NOTE — Telephone Encounter (Signed)
Pt husband Ed made aware of Dr. Leone Payor recommendations: Ed stated that Ruchi has been having chronic constipation, abdominal pain, Loss of appetite. Pt was scheduled to see Dr. Leone Payor 02/10/2023 at 8:30 AM. Ed made aware. Ed verbalized understanding with all questions answered.

## 2023-02-02 ENCOUNTER — Encounter (HOSPITAL_BASED_OUTPATIENT_CLINIC_OR_DEPARTMENT_OTHER): Payer: Self-pay | Admitting: Emergency Medicine

## 2023-02-02 ENCOUNTER — Emergency Department (HOSPITAL_BASED_OUTPATIENT_CLINIC_OR_DEPARTMENT_OTHER): Payer: Medicare HMO

## 2023-02-02 ENCOUNTER — Emergency Department (HOSPITAL_BASED_OUTPATIENT_CLINIC_OR_DEPARTMENT_OTHER)
Admission: EM | Admit: 2023-02-02 | Discharge: 2023-02-02 | Disposition: A | Discharge status: Home / Self Care | Payer: Medicare HMO | Attending: Emergency Medicine | Admitting: Emergency Medicine

## 2023-02-02 ENCOUNTER — Other Ambulatory Visit: Payer: Self-pay

## 2023-02-02 DIAGNOSIS — R1084 Generalized abdominal pain: Secondary | ICD-10-CM | POA: Diagnosis not present

## 2023-02-02 DIAGNOSIS — C8590 Non-Hodgkin lymphoma, unspecified, unspecified site: Secondary | ICD-10-CM | POA: Diagnosis not present

## 2023-02-02 DIAGNOSIS — K5900 Constipation, unspecified: Secondary | ICD-10-CM | POA: Diagnosis not present

## 2023-02-02 DIAGNOSIS — Z8572 Personal history of non-Hodgkin lymphomas: Secondary | ICD-10-CM | POA: Diagnosis not present

## 2023-02-02 DIAGNOSIS — I10 Essential (primary) hypertension: Secondary | ICD-10-CM | POA: Insufficient documentation

## 2023-02-02 DIAGNOSIS — Z7982 Long term (current) use of aspirin: Secondary | ICD-10-CM | POA: Diagnosis not present

## 2023-02-02 DIAGNOSIS — R109 Unspecified abdominal pain: Secondary | ICD-10-CM | POA: Diagnosis not present

## 2023-02-02 DIAGNOSIS — Z79899 Other long term (current) drug therapy: Secondary | ICD-10-CM | POA: Diagnosis not present

## 2023-02-02 DIAGNOSIS — R590 Localized enlarged lymph nodes: Secondary | ICD-10-CM | POA: Diagnosis not present

## 2023-02-02 LAB — URINALYSIS, ROUTINE W REFLEX MICROSCOPIC
Bilirubin Urine: NEGATIVE
Glucose, UA: NEGATIVE mg/dL
Hgb urine dipstick: NEGATIVE
Ketones, ur: NEGATIVE mg/dL
Leukocytes,Ua: NEGATIVE
Nitrite: NEGATIVE
Protein, ur: NEGATIVE mg/dL
Specific Gravity, Urine: 1.012 (ref 1.005–1.030)
pH: 7.5 (ref 5.0–8.0)

## 2023-02-02 LAB — COMPREHENSIVE METABOLIC PANEL
ALT: 30 U/L (ref 0–44)
AST: 31 U/L (ref 15–41)
Albumin: 4.3 g/dL (ref 3.5–5.0)
Alkaline Phosphatase: 101 U/L (ref 38–126)
Anion gap: 8 (ref 5–15)
BUN: 19 mg/dL (ref 8–23)
CO2: 27 mmol/L (ref 22–32)
Calcium: 9.2 mg/dL (ref 8.9–10.3)
Chloride: 104 mmol/L (ref 98–111)
Creatinine, Ser: 0.75 mg/dL (ref 0.44–1.00)
GFR, Estimated: >60 mL/min (ref >60)
Glucose, Bld: 101 mg/dL — ABNORMAL HIGH (ref 70–99)
Potassium: 4.2 mmol/L (ref 3.5–5.1)
Sodium: 139 mmol/L (ref 135–145)
Total Bilirubin: 0.7 mg/dL (ref 0.3–1.2)
Total Protein: 6.4 g/dL — ABNORMAL LOW (ref 6.5–8.1)

## 2023-02-02 LAB — LIPASE, BLOOD: Lipase: 18 U/L (ref 11–51)

## 2023-02-02 LAB — CBC
HCT: 40 % (ref 36.0–46.0)
Hemoglobin: 13.3 g/dL (ref 12.0–15.0)
MCH: 32.6 pg (ref 26.0–34.0)
MCHC: 33.3 g/dL (ref 30.0–36.0)
MCV: 98 fL (ref 80.0–100.0)
Platelets: 192 10*3/uL (ref 150–400)
RBC: 4.08 MIL/uL (ref 3.87–5.11)
RDW: 12.5 % (ref 11.5–15.5)
WBC: 6.1 10*3/uL (ref 4.0–10.5)
nRBC: 0 % (ref 0.0–0.2)

## 2023-02-02 MED ORDER — IOHEXOL 300 MG/ML  SOLN
100.0000 mL | Freq: Once | INTRAMUSCULAR | Status: AC | PRN
  Administered 2023-02-02: 85 mL via INTRAVENOUS

## 2023-02-02 MED ORDER — SODIUM CHLORIDE 0.9 % IV BOLUS
1000.0000 mL | Freq: Once | INTRAVENOUS | Status: AC
  Administered 2023-02-02: 1000 mL via INTRAVENOUS

## 2023-02-02 NOTE — Discharge Instructions (Signed)
Thank you for letting us evaluate you today.  I have printed out your CT.  Please follow-up with GI as scheduled.  Please take half a bottle of magnesium for laxative.  If you feel bad tomorrow you may take the rest of the bottle.  In the emergency department if you experience worsening abdominal pain, intractable vomiting, inability to keep fluids down.

## 2023-02-02 NOTE — ED Notes (Signed)
Pt aware of need for urine sample. Urine cup at bedside and instructed pt to press call button when she is able to go.

## 2023-02-02 NOTE — ED Provider Notes (Signed)
[place] EMERGENCY DEPARTMENT AT [facility] Provider Note   CSN: [id number] Arrival date & time: 02/02/23  1228     History  Chief Complaint  Patient presents with   Abdominal Pain    Gabrielle [name] is a 62 y.o. female with past medical history of hypertension, non-Hodgkin's lymphoma, chronic constipation, surgical perforation with sepsis, presents emergency department for evaluation of abdominal pain, bloating, decreased appetite and constipation that has worsened over past two weeks.  She reports that she has taken 3 enemas without relief to constipation. She had one BM with first enema on Saturday but believed it was not a complete BM. Since then, she has had 2 additional enemas with no Bms. Prior to enemas, she has not had a bowel movement in 2 weeks.  She normally has a bowel movement once a week at baseline.  She has a appointment with GI next week regarding chronic constipation.  She denies fever, urinary symptoms, change in medications, back pain, vomiting.  Of note, she had an intestinal perforation following a laparoscopy which resulted in sepsis in 2016.   Abdominal Pain Associated symptoms: constipation   Associated symptoms: no chest pain, no chills, no cough, no diarrhea, no fatigue, no fever, no nausea, no shortness of breath and no vomiting        Home Medications Prior to Admission medications   Medication Sig Start Date End Date Taking? Authorizing Provider  ALPRAZolam Prudy Feeler) 0.5 MG tablet Take 1 tablet (0.5 mg total) by mouth 3 (three) times daily. 06/19/21   Jeoffrey Massed, MD  aspirin EC 81 MG tablet Take by mouth. 03/12/19   [provider]  atorvastatin (LIPITOR) 80 MG tablet TAKE 1 TABLET EVERY DAY 12/02/21   McGowen, Maryjean Morn, MD  ezetimibe (ZETIA) 10 MG tablet Take 1 tablet (10 mg total) by mouth daily. 06/19/21   McGowen, Maryjean Morn, MD  lamoTRIgine (LAMICTAL) 200 MG tablet Take 200 mg by mouth daily. 03/12/19   [provider]   metoprolol succinate (TOPROL-XL) 25 MG 24 hr tablet TAKE 1/2 TABLET ONE TIME DAILY 06/19/21   McGowen, Maryjean Morn, MD  Multiple Vitamins-Minerals (MULTIVITAMIN ADULT PO) Take by mouth daily.    [provider]  nitroGLYCERIN (NITROSTAT) 0.4 MG SL tablet Place under the tongue. Patient not taking: Reported on 10/01/2022 05/25/21   [provider]  omeprazole (PRILOSEC) 40 MG capsule Take 1 capsule (40 mg total) by mouth daily. 06/19/21   McGowen, Maryjean Morn, MD  PARoxetine (PAXIL) 40 MG tablet Take 1 tablet (40 mg total) by mouth daily. 12/04/21   McGowen, Maryjean Morn, MD  zolpidem (AMBIEN) 10 MG tablet Take 1 tablet (10 mg total) by mouth at bedtime as needed for sleep. 06/19/21 10/01/22  McGowenMaryjean Morn, MD      Allergies    Rocephin [ceftriaxone] and Sulfa antibiotics    Review of Systems   Review of Systems  Constitutional:  Negative for chills, fatigue and fever.  Respiratory:  Negative for cough, chest tightness, shortness of breath and wheezing.   Cardiovascular:  Negative for chest pain and palpitations.  Gastrointestinal:  Positive for abdominal pain and constipation. Negative for diarrhea, nausea and vomiting.  Neurological:  Negative for dizziness, seizures, weakness, light-headedness, numbness and headaches.    Physical Exam Updated Vital Signs BP 127/60   Pulse 70   Temp 97.7 F (36.5 C)   Resp 18   LMP 10/20/2011   SpO2 97%  Physical Exam Vitals and nursing note  reviewed.  Constitutional:      General: She is not in acute distress.    Appearance: Normal appearance.  HENT:     Head: Normocephalic and atraumatic.  Eyes:     Conjunctiva/sclera: Conjunctivae normal.  Cardiovascular:     Rate and Rhythm: Normal rate.  Pulmonary:     Effort: Pulmonary effort is normal. No respiratory distress.     Breath sounds: Normal breath sounds.  Chest:     Chest wall: No tenderness.  Abdominal:     General: Bowel sounds are normal. There is no distension.      Palpations: Abdomen is soft. There is no shifting dullness or fluid wave.     Tenderness: There is generalized abdominal tenderness. There is no right CVA tenderness or left CVA tenderness.  Skin:    General: Skin is warm.     Capillary Refill: Capillary refill takes less than 2 seconds.     Coloration: Skin is not jaundiced or pale.  Neurological:     General: No focal deficit present.     Mental Status: She is alert and oriented to person, place, and time. Mental status is at baseline.     ED Results / Procedures / Treatments   Labs (all labs ordered are listed, but only abnormal results are displayed) Labs Reviewed  COMPREHENSIVE METABOLIC PANEL - Abnormal; Notable for the following components:      Result Value   Glucose, Bld 101 (*)    Total Protein 6.4 (*)    All other components within normal limits  LIPASE, BLOOD  CBC  URINALYSIS, ROUTINE W REFLEX MICROSCOPIC    EKG None  Radiology CT ABDOMEN PELVIS W CONTRAST  Result Date: 02/02/2023 CLINICAL DATA:  Abdominal pain and distention. Anorexia. Chronic constipation. Non-Hodgkin's lymphoma. * Tracking Code: BO * EXAM: CT ABDOMEN AND PELVIS WITH CONTRAST TECHNIQUE: Multidetector CT imaging of the abdomen and pelvis was performed using the standard protocol following bolus administration of intravenous contrast. RADIATION DOSE REDUCTION: This exam was performed according to the departmental dose-optimization program which includes automated exposure control, adjustment of the mA and/or kV according to patient size and/or use of iterative reconstruction technique. CONTRAST:  85mL OMNIPAQUE IOHEXOL 300 MG/ML  SOLN COMPARISON:  10/01/2022 FINDINGS: Lower Chest: No acute findings. Hepatobiliary: No suspicious hepatic masses identified. Gallbladder is unremarkable. No evidence of biliary ductal dilatation. Pancreas:  No mass or inflammatory changes. Spleen: Within normal limits in size and appearance. Adrenals/Urinary Tract: No suspicious  masses identified. No evidence of ureteral calculi or hydronephrosis. Unremarkable unopacified urinary bladder. Stomach/Bowel: A short-segment area of small-bowel wall thickening is seen in the right abdomen, just deep to the anterior abdominal wall muscles (see image 42/2). Soft tissue stranding is seen in the adjacent small bowel mesentery. These findings are new since recent exam. Differential diagnosis includes lymphoma and enteritis. No evidence of bowel obstruction or abscess. Normal appendix visualized. Vascular/Lymphatic: Mild mesenteric lymphadenopathy is seen in the central and left abdomen, with largest lymph node measuring 15 mm on image 34/2. This remains stable. No new or increased sites of lymphadenopathy identified. Reproductive:  No mass or other significant abnormality. Other:  None. Musculoskeletal:  No suspicious bone lesions identified. IMPRESSION: Stable mild mesenteric lymphadenopathy, consistent with history of non-Hodgkin lymphoma. New short-segment area of small-bowel wall thickening in right abdomen, with adjacent soft tissue stranding. Differential diagnosis includes lymphoma and enteritis. No evidence of bowel obstruction or abscess. Electronically Signed   By: Danae Orleans M.D.   On:  02/02/2023 16:15    Procedures Procedures    Medications Ordered in ED Medications  sodium chloride 0.9 % bolus 1,000 mL (0 mLs Intravenous Stopped 02/02/23 1438)  iohexol (OMNIPAQUE) 300 MG/ML solution 100 mL (85 mLs Intravenous Contrast Given 02/02/23 1439)    ED Course/ Medical Decision Making/ A&P                                 Medical Decision Making Amount and/or Complexity of Data Reviewed Labs: ordered. Radiology: ordered.  Risk Prescription drug management.   Patient presents to the ED for concern of constipation and abdominal pain that has worsened over the past 2 weeks, this involves an extensive number of treatment options, and is a complaint that carries with it a high  risk of complications and morbidity.  The differential diagnosis includes chronic constipation, bowel perforation, obstruction, gastroparesis   Co morbidities that complicate the patient evaluation  None   Additional history obtained:  Additional history obtained from Family, Nursing, Outside Medical Records, and Past Admission   External records from outside source obtained upon chart review   Lab Tests:  I Ordered, and personally interpreted labs.  The pertinent results include: No leukocytosis nor anemia   Imaging Studies ordered:  I ordered imaging studies including CT abdomen and pelvis with contrast I independently visualized and interpreted imaging which showed new short segment area of small bowel wall thickening in right abdomen with adjacent soft tissue stranding ( see report ) I agree with the radiologist interpretation   Cardiac Monitoring:  I do not feel that cardiac monitoring is required at patient has focused complaint.  Vital signs within normal limits and she is hemodynamically stable.  She has no complaints of chest pain, shortness of breath, epigastric pain, nausea, vomiting.   Medicines ordered and prescription drug management:  I have reviewed the patients home medicines and have made adjustments as needed   Consultations Obtained:  I requested consultation with GI at [facility] (Dr. Earlene Plater),  and discussed lab and imaging findings as well as pertinent plan - they recommend: Outpatient management with magnesium or laxative and continue with scheduled appointment on 02/10/2023.  He does not feel that admission is required at this time.   Problem List / ED Course:  Constipation Generalized abdominal pain   Reevaluation:  After the interventions noted above, I reevaluated the patient and found that they have :stayed the same    Dispostion:  Upon evaluation, patient is resting comfortably in bed.  She is not ill-appearing or signs of physical  distress.  Vital signs WNL.  See HPI.  Upon assessment, patient is diffusely tender in abdomen.  Bowel sounds are normal.  Will obtain CT abdomen and pelvis to ensure no acute pathology or perforation present.  She denies nausea, vomiting, fever, back pain, urinary symptoms.  CBC not significant for leukocytosis nor anemia.  CMP is not significant for electrolyte abnormality requiring treatment.  Lipase negative.  UA negative for acute infection.  CT significant for short small bowel wall thickening in right abdomen and soft tissue stranding in small bowel mesentery new from last CT on 10/01/2022 which may be concerning for lymphoma vs enteritis (see report). Consulted GI at [facility] Dr. Earlene Plater who recommended patient to take half a bottle of magnesium tonight which should relieve constipation and fullness and pain.  If this does not relieve the pain they can take another half of the bottle  tomorrow.  He reviewed CT and was confident that patient is stable for discharge and can manage symptoms outpatient until her appointment at their facility on 02/10/2023.    After consideration of the diagnostic results and the patients response to treatment, I feel that the patent would benefit from outpatient management of symptoms and follow-up with GI with their appointment is scheduled in 1 week.  Discussed importance of maintaining adequate fluid hydration, high-fiber diet, continuation of stool softeners at home.  Discussed findings, imaging, disposition, return to emergency precautions to patient expresses understanding and agrees with plan.  Return to emergency department to include but not limited to worsening abdominal pain, fever, vomiting, distention, inability to pass flatulence, inability to maintain oral hydration  Explained assessment and treatment plan with Dr. Wallace Cullens who agrees with plan.        Final Clinical Impression(s) / ED Diagnoses Final diagnoses:  Constipation, unspecified constipation  type  Generalized abdominal pain    Rx / DC Orders ED Discharge Orders     None         Judithann Sheen, PA 02/02/23 1758    Sloan Leiter, DO 02/03/23 [phone]

## 2023-02-02 NOTE — ED Notes (Signed)
Pt ambulatory to restroom, pt slow but steady gait. States cramping intensifies when standing straight upright.

## 2023-02-02 NOTE — ED Triage Notes (Signed)
Pt Gabrielle [name], ambulatory c/o abd pain, abd distension, and decreased appetite with PMH chronic constipation. Pt states she has tried 2-3 enemas at home.  LBM 3 days ago after first enema but still feels full, prior to then had been 2 weeks.

## 2023-02-03 ENCOUNTER — Telehealth: Payer: Self-pay | Admitting: *Deleted

## 2023-02-03 NOTE — Telephone Encounter (Signed)
Husband called to report she was in ER last night with abdominal pain and had CT scan. Asking for Dr. Truett Perna to review and let them know what he thinks next step is. They will be seeing Dr. Leone Payor as a new patient on 02/10/23.

## 2023-02-07 ENCOUNTER — Telehealth: Payer: Self-pay | Admitting: *Deleted

## 2023-02-07 NOTE — Telephone Encounter (Signed)
Called Gabrielle [name] regarding CT of 02/02/23 and Dr. Kalman Drape comments: Need to see Dr. Darrick Grinder as scheduled. We can see if Dr. Leone Payor recommends. Doubts sypmtoms related to lymphoma

## 2023-02-10 ENCOUNTER — Encounter: Payer: Self-pay | Admitting: Internal Medicine

## 2023-02-10 ENCOUNTER — Ambulatory Visit: Payer: Medicare HMO | Admitting: Internal Medicine

## 2023-02-10 VITALS — BP 108/66 | HR 77 | Ht 64.0 in | Wt 140.0 lb

## 2023-02-10 DIAGNOSIS — C8580 Other specified types of non-Hodgkin lymphoma, unspecified site: Secondary | ICD-10-CM | POA: Diagnosis not present

## 2023-02-10 DIAGNOSIS — K5909 Other constipation: Secondary | ICD-10-CM

## 2023-02-10 DIAGNOSIS — Z1211 Encounter for screening for malignant neoplasm of colon: Secondary | ICD-10-CM

## 2023-02-10 DIAGNOSIS — R9389 Abnormal findings on diagnostic imaging of other specified body structures: Secondary | ICD-10-CM | POA: Diagnosis not present

## 2023-02-10 MED ORDER — PLENVU 140 G PO SOLR: 1.0000 | ORAL | Status: DC

## 2023-02-10 NOTE — Progress Notes (Addendum)
Gabrielle [name] 62 y.o. [birth year] [id number]  Assessment & Plan:   Encounter Diagnoses  Name Primary?   Chronic constipation Yes   Abnormal CT scan small bowel    Colon cancer screening    Diffuse non-Hodgkin's lymphoma (HCC)       Recent episode of severe abdominal pain and bloating after two weeks without a bowel movement. CT scan showed thickening in a part of the small intestine, possibly due to spasm or inflammation. No clear signs of this abnormal intestine representing lymphoma. Patient reports improvement with Milk of Magnesia. -Continue Milk of Magnesia every other day to promote regular bowel movements. -Consider increasing frequency to daily if constipation persists.  Abdominal Pain has resolved. Likely secondary to constipation. No signs of infection or changes in lymphoma on CT scan. -Monitor for recurrence. Repeat imaging later this year vs early 2025 per  Dr. Truett Perna  -Schedule screening colonoscopy. -Plan for modified two-day prep due to chronic constipation. (Miralax purge + Plenvu) Functional rectal exam at time of colonoscopy     OZ:HYQM, Ashish, MD   Subjective:   Chief Complaint: abdominal pain + constipation and abnormal CTscan  HPI Discussed the use of AI scribe software for clinical note transcription with the patient, who gave verbal consent to proceed.  History of Present Illness   The patient, with a history of lymphoma and chronic constipation, presented with an episode of severe abdominal pain 02/02/23 that started as a shooting, burning sensation and then transitioned to a generalized soreness. The pain was noted to be across the entire abdomen, from the upper region down. The patient reported a significant increase in abdominal hardness and bloating, which prompted an emergency department visit.  The patient has a history of constipation, but this episode was preceded by a two-week period without a bowel movement, which was longer than her  usual pattern of one or two bowel movements per week. The patient reported no nausea, vomiting, or fever associated with this episode.  Following the emergency department visit, the patient reported a return to her baseline health status after taking milk of magnesia for two days. The patient has previously tried Miralax and stool softeners for her chronic constipation, but found milk of magnesia to be more effective.  The patient also has a history of lymphoma, which has been managed without treatment by choice. The patient had a biopsy and subsequent intestinal perforation in 2016, which required surgical intervention. The patient is due for a follow-up scan for her lymphoma later this year (Dr. Truett Perna)       CT abd/pelvis 02/02/23 (images viewed) IMPRESSION: Stable mild mesenteric lymphadenopathy, consistent with history of non-Hodgkin lymphoma.   New short-segment area of small-bowel wall thickening in right abdomen, with adjacent soft tissue stranding. Differential diagnosis includes lymphoma and enteritis.   No evidence of bowel obstruction or abscess.   Lab Results  Component Value Date   WBC 6.1 02/02/2023   HGB 13.3 02/02/2023   HCT 40.0 02/02/2023   MCV 98.0 02/02/2023   PLT 192 02/02/2023     Chemistry      Component Value Date/Time   NA 139 02/02/2023 1241   K 4.2 02/02/2023 1241   CL 104 02/02/2023 1241   CO2 27 02/02/2023 1241   BUN 19 02/02/2023 1241   CREATININE 0.75 02/02/2023 1241   CREATININE 0.76 10/01/2022 1027      Component Value Date/Time   CALCIUM 9.2 02/02/2023 1241   ALKPHOS 101 02/02/2023 1241   AST  31 02/02/2023 1241   AST 39 09/28/2021 0814   ALT 30 02/02/2023 1241   ALT 39 09/28/2021 0814   BILITOT 0.7 02/02/2023 1241   BILITOT 0.5 09/28/2021 0814     Lab Results  Component Value Date   LIPASE 18 02/02/2023     Wt Readings from Last 3 Encounters:  02/10/23 140 lb (63.5 kg)  10/01/22 143 lb 12.8 oz (65.2 kg)  04/01/22 142 lb  12.8 oz (64.8 kg)     Allergies  Allergen Reactions   Rocephin [Ceftriaxone] Anaphylaxis, Hives and Shortness Of Breath    Facial numbness   Sulfa Antibiotics Itching and Rash   Current Meds  Medication Sig   ALPRAZolam (XANAX) 0.5 MG tablet Take 1 tablet (0.5 mg total) by mouth 3 (three) times daily.   aspirin EC 81 MG tablet Take by mouth.   atorvastatin (LIPITOR) 80 MG tablet TAKE 1 TABLET EVERY DAY   ezetimibe (ZETIA) 10 MG tablet Take 1 tablet (10 mg total) by mouth daily.   lamoTRIgine (LAMICTAL) 200 MG tablet Take 200 mg by mouth daily.   Magnesium Hydroxide (MILK OF MAGNESIA PO) Take by mouth every other day.   metoprolol succinate (TOPROL-XL) 25 MG 24 hr tablet TAKE 1/2 TABLET ONE TIME DAILY   Multiple Vitamins-Minerals (MULTIVITAMIN ADULT PO) Take by mouth daily.   nitroGLYCERIN (NITROSTAT) 0.4 MG SL tablet Place under the tongue.   omeprazole (PRILOSEC) 40 MG capsule Take 1 capsule (40 mg total) by mouth daily.   PARoxetine (PAXIL) 40 MG tablet Take 1 tablet (40 mg total) by mouth daily.   PEG-KCl-NaCl-NaSulf-Na Asc-C (PLENVU) 140 g SOLR Take 1 kit by mouth as directed.   zolpidem (AMBIEN) 10 MG tablet Take 1 tablet (10 mg total) by mouth at bedtime as needed for sleep.   Past Medical History:  Diagnosis Date   Anxiety and depression    CAD (coronary artery disease)    MI + stent 2018   Colon cancer screening 04/2019   Cologuard NEG.  Repeat 04/2022   Diffuse large B cell lymphoma (HCC) 2018   NHL-L peritoneal mesenteric mass-->no evidence of aggressive lymphoma as of 02/2021 hem/onc f/u   Dyspepsia    GERD (gastroesophageal reflux disease)    History of myocardial infarction 2018   Hx of migraines    remote past   Hypercholesterolemia    NHL (non-Hodgkin's lymphoma) (HCC) 2016   left peritoneal mesonteric mass   Palpitations 2015   no monitoring was done-->resolved spontaneously   Past Surgical History:  Procedure Laterality Date   biopsy  2016   Initial bx  needle; then lab abd (done in [place] [facility]) done to get further bx, exp lap--complications--sepsis/hemorrhage--memory/cognitive difficulties since that surgery/hospitalization--details somewhat cloudy from pt's info.   BREAST ENHANCEMENT SURGERY  2003   Heart Stent  2018   Prox LAD ([place]).   INCISIONAL HERNIA REPAIR  2017   w/mesh   TONSILLECTOMY     TRANSTHORACIC ECHOCARDIOGRAM  09/2013   2015 ALL NORMAL.  2020 normal per pt report (Drr. Adella Hare with [facility] [place]   WISDOM TOOTH EXTRACTION     Social History   Social History Narrative   Married to Ed, no children.   Relocated from [place] about 10/2018 returned to [place] 2022 approx   Educ: [employer], grad w/honors.   [employer] and others prior to disability.   Disabled due to bipolar, depression 2012.   No tob, alc.   No drug  use.   family history includes CAD in her maternal aunt; Colon cancer in an other family member; Depression in her brother, maternal aunt, and mother; Emphysema in her mother; Heart disease in her mother; Heart failure in her mother; Hypertension in her brother and mother; Stroke in her maternal aunt and mother.   Review of Systems Back pain, fatigue +  As per HPI  Objective:   Physical Exam BP 108/66   Pulse 77   Ht 5\' 4"  (1.626 m)   Wt 140 lb (63.5 kg)   LMP 10/20/2011   BMI 24.03 kg/m  Physical Exam   NECK: No lymph nodes in the neck or supraclavicular area. CHEST: Lungs clear to auscultation. CARDIOVASCULAR: Heart sounds normal. ABDOMEN: Soft, nontender, without masses, bowel sounds normal. EXTREMITIES: No edema in the ankles.

## 2023-02-10 NOTE — Patient Instructions (Addendum)
VISIT SUMMARY:  During your recent visit, we discussed your chronic constipation, abdominal pain, and lymphoma. You had a severe episode of abdominal pain and bloating after two weeks without a bowel movement. A CT scan showed some thickening in a part of your small intestine, but no signs of lymphoma. Your abdominal pain has resolved, likely due to constipation. Your lymphoma remains stable and does not require treatment at this time. You have never had a colonoscopy before, but you are now open to the procedure given your health status.  YOUR PLAN:  -CHRONIC CONSTIPATION: This is a condition where you have difficulty passing stool or don't have bowel movements regularly. We will continue with Milk of Magnesia every other day to promote regular bowel movements. If constipation persists, we may consider increasing the frequency to daily.  -ABDOMINAL PAIN: This is pain that you feel anywhere between your chest and groin. The pain has resolved and was likely due to constipation. We will monitor for recurrence.  -COLONOSCOPY: This is a test that allows your doctor to look at the inner lining of your large intestine. We will schedule a colonoscopy and plan for a two-day prep due to your chronic constipation.  INSTRUCTIONS:  Continue monitoring your lymphoma with Dr. Truett Perna and follow-up after your colonoscopy. If you experience any recurrence of abdominal pain or changes in your bowel movements, please contact our office immediately.  You have been scheduled for a colonoscopy. Please follow written instructions given to you at your visit today.   Please pick up your prep supplies at the pharmacy within the next 1-3 days.  If you use inhalers (even only as needed), please bring them with you on the day of your procedure.  DO NOT TAKE 7 DAYS PRIOR TO TEST- Trulicity (dulaglutide) Ozempic, Wegovy (semaglutide) Mounjaro (tirzepatide) Bydureon Bcise (exanatide extended release)  DO NOT TAKE 1  DAY PRIOR TO YOUR TEST Rybelsus (semaglutide) Adlyxin (lixisenatide) Victoza (liraglutide) Byetta (exanatide) ___________________________________________________________________________  I appreciate the opportunity to care for you. Stan Head, MD, [facility]

## 2023-02-13 ENCOUNTER — Encounter: Payer: Self-pay | Admitting: Certified Registered Nurse Anesthetist

## 2023-02-16 DIAGNOSIS — F339 Major depressive disorder, recurrent, unspecified: Secondary | ICD-10-CM | POA: Diagnosis not present

## 2023-02-16 DIAGNOSIS — F419 Anxiety disorder, unspecified: Secondary | ICD-10-CM | POA: Diagnosis not present

## 2023-02-16 DIAGNOSIS — C8590 Non-Hodgkin lymphoma, unspecified, unspecified site: Secondary | ICD-10-CM | POA: Diagnosis not present

## 2023-02-16 DIAGNOSIS — K5900 Constipation, unspecified: Secondary | ICD-10-CM | POA: Diagnosis not present

## 2023-02-17 ENCOUNTER — Encounter: Payer: Medicare HMO | Admitting: Internal Medicine

## 2023-02-17 ENCOUNTER — Telehealth: Payer: Self-pay | Admitting: *Deleted

## 2023-02-17 ENCOUNTER — Encounter: Payer: Self-pay | Admitting: Internal Medicine

## 2023-02-17 NOTE — Telephone Encounter (Signed)
Spoke with Cathlyn Parsons, CRNA this morning. The patient's procedure was canceled today due to her EF being 20-25% which is less than our standard of 35%. I attempted to contact the patient but was unable to reach her and left a VM.

## 2023-02-17 NOTE — Telephone Encounter (Signed)
I spoke to husband and explained that colonoscopy is cancelled today.  I will follow-up on this.

## 2023-02-18 ENCOUNTER — Telehealth: Payer: Self-pay | Admitting: *Deleted

## 2023-02-18 NOTE — Telephone Encounter (Signed)
Gabrielle [name] had requested to see Dr. Truett Perna soon due to meeting her OOP deductible for the year and her insurance will change in 2025 with higher copays/deductible. Already had CT this month. She thinks Dr. Truett Perna will order a PET scan. Colonoscopy was cancelled due to EF 20-25%. Expresses frustration about this since she already had done her prep. Verbalizes that she does not plan to reschedule the procedure. Per Dr. Truett Perna: OK to see her last week of November. He will not order a PET without seeing her first. Scheduler notified.

## 2023-02-18 NOTE — Telephone Encounter (Signed)
Patient will reschedule when or if she is ready.  We would need to do an echocardiogram.

## 2023-03-21 ENCOUNTER — Inpatient Hospital Stay: Payer: Medicare HMO | Admitting: Oncology

## 2023-03-21 ENCOUNTER — Inpatient Hospital Stay: Payer: Medicare HMO | Attending: Oncology

## 2023-03-21 VITALS — BP 115/76 | HR 69 | Temp 98.1°F | Resp 18 | Ht 64.0 in | Wt 142.6 lb

## 2023-03-21 DIAGNOSIS — C8580 Other specified types of non-Hodgkin lymphoma, unspecified site: Secondary | ICD-10-CM

## 2023-03-21 DIAGNOSIS — Z8572 Personal history of non-Hodgkin lymphomas: Secondary | ICD-10-CM | POA: Insufficient documentation

## 2023-03-21 DIAGNOSIS — I2510 Atherosclerotic heart disease of native coronary artery without angina pectoris: Secondary | ICD-10-CM | POA: Insufficient documentation

## 2023-03-21 DIAGNOSIS — E785 Hyperlipidemia, unspecified: Secondary | ICD-10-CM | POA: Insufficient documentation

## 2023-03-21 DIAGNOSIS — K5900 Constipation, unspecified: Secondary | ICD-10-CM | POA: Insufficient documentation

## 2023-03-21 DIAGNOSIS — F32A Depression, unspecified: Secondary | ICD-10-CM | POA: Insufficient documentation

## 2023-03-21 DIAGNOSIS — I509 Heart failure, unspecified: Secondary | ICD-10-CM | POA: Diagnosis not present

## 2023-03-21 DIAGNOSIS — F419 Anxiety disorder, unspecified: Secondary | ICD-10-CM | POA: Insufficient documentation

## 2023-03-21 LAB — CBC WITH DIFFERENTIAL (CANCER CENTER ONLY)
Abs Immature Granulocytes: 0.01 10*3/uL (ref 0.00–0.07)
Basophils Absolute: 0 10*3/uL (ref 0.0–0.1)
Basophils Relative: 1 %
Eosinophils Absolute: 0.2 10*3/uL (ref 0.0–0.5)
Eosinophils Relative: 3 %
HCT: 40.5 % (ref 36.0–46.0)
Hemoglobin: 13.5 g/dL (ref 12.0–15.0)
Immature Granulocytes: 0 %
Lymphocytes Relative: 44 %
Lymphs Abs: 2.4 10*3/uL (ref 0.7–4.0)
MCH: 32.7 pg (ref 26.0–34.0)
MCHC: 33.3 g/dL (ref 30.0–36.0)
MCV: 98.1 fL (ref 80.0–100.0)
Monocytes Absolute: 0.5 10*3/uL (ref 0.1–1.0)
Monocytes Relative: 9 %
Neutro Abs: 2.3 10*3/uL (ref 1.7–7.7)
Neutrophils Relative %: 43 %
Platelet Count: 179 10*3/uL (ref 150–400)
RBC: 4.13 MIL/uL (ref 3.87–5.11)
RDW: 12.3 % (ref 11.5–15.5)
WBC Count: 5.4 10*3/uL (ref 4.0–10.5)
nRBC: 0 % (ref 0.0–0.2)

## 2023-03-21 LAB — CMP (CANCER CENTER ONLY)
ALT: 25 U/L (ref 0–44)
AST: 25 U/L (ref 15–41)
Albumin: 4.4 g/dL (ref 3.5–5.0)
Alkaline Phosphatase: 104 U/L (ref 38–126)
Anion gap: 5 (ref 5–15)
BUN: 18 mg/dL (ref 8–23)
CO2: 30 mmol/L (ref 22–32)
Calcium: 9 mg/dL (ref 8.9–10.3)
Chloride: 106 mmol/L (ref 98–111)
Creatinine: 0.94 mg/dL (ref 0.44–1.00)
GFR, Estimated: >60 mL/min (ref >60)
Glucose, Bld: 101 mg/dL — ABNORMAL HIGH (ref 70–99)
Potassium: 4.5 mmol/L (ref 3.5–5.1)
Sodium: 141 mmol/L (ref 135–145)
Total Bilirubin: 0.6 mg/dL (ref <1.2)
Total Protein: 6.3 g/dL — ABNORMAL LOW (ref 6.5–8.1)

## 2023-03-21 LAB — LACTATE DEHYDROGENASE: LDH: 151 U/L (ref 98–192)

## 2023-03-21 NOTE — Progress Notes (Signed)
[facility] OFFICE PROGRESS NOTE   Diagnosis: Non-Hodgkin's lymphoma  INTERVAL HISTORY:   [name] returns prior to scheduled visit.  She was seen in the emergency room 02/02/2023 with abdominal pain.  She was constipated for several weeks prior to the emergency room visit.  Constipation is relieved with milk of magnesia.  The abdominal pain has resolved. A CT abdomen/pelvis 02/02/2023 filled a short segment of small bowel thickening in the right abdomen deep to the, wall muscles.  There was soft tissue stranding in the adjacent mesentery.  Stable mild mesenteric lymphadenopathy in the central and left abdomen.  No new or increased lymphadenopathy.  She feels well.  No fever or night sweats.  Good appetite.  No abdominal pain.  Objective:  Vital signs in last 24 hours:  Blood pressure 115/76, pulse 69, temperature 98.1 F (36.7 C), temperature source Temporal, resp. rate 18, height 5\' 4"  (1.626 m), weight 142 lb 9.6 oz (64.7 kg), last menstrual period 10/20/2011, SpO2 97%.    Lymphatics: No cervical, supraclavicular, axillary, or inguinal nodes Resp: Lungs clear bilaterally Cardio: Regular rate and rhythm GI: No hepatosplenomegaly, mild firmness in the mid upper abdomen without a discrete mass, mild tenderness in the mid abdomen Vascular: No leg edema  Lab Results:  Lab Results  Component Value Date   WBC 5.4 03/21/2023   HGB 13.5 03/21/2023   HCT 40.5 03/21/2023   MCV 98.1 03/21/2023   PLT 179 03/21/2023   NEUTROABS 2.3 03/21/2023    CMP  Lab Results  Component Value Date   NA 139 02/02/2023   K 4.2 02/02/2023   CL 104 02/02/2023   CO2 27 02/02/2023   GLUCOSE 101 (H) 02/02/2023   BUN 19 02/02/2023   CREATININE 0.75 02/02/2023   CALCIUM 9.2 02/02/2023   PROT 6.4 (L) 02/02/2023   ALBUMIN 4.3 02/02/2023   AST 31 02/02/2023   ALT 30 02/02/2023   ALKPHOS 101 02/02/2023   BILITOT 0.7 02/02/2023   GFRNONAA >60 02/02/2023   GFRAA  06/20/2010    >60         The eGFR has been calculated using the MDRD equation. This calculation has not been validated in all clinical situations. eGFR's persistently <60 mL/min signify possible Chronic Kidney Disease.     Medications: I have reviewed the patient's current medications.   Assessment/Plan: Non-Hodgkin lymphoma, B-cell follicular center cell type CT abdomen/pelvis 05/29/2014-left abdominal intraperitoneal mass, subcentimeter liver lesions felt to be benign, no adenopathy CT-guided biopsy of left abdominal mass 06/04/2014-non-Hodgkin B-cell lymphoma, follicular center cell type, high-grade favored, abundance of large lymphocytes admixed with small angulated lymphocytes Relocated to [facility], underwent an open diagnostic biopsy 07/16/2014-pathology from mesenteric lymph node biopsies revealed sclerosing follicular B-cell lymphoma, CD20 positive, CD10 positive, predominantly diffuse architecture, low-grade.  Flow cytometry revealed a monoclonal B-cell population, lambda light chain restricted, 12.9% large cells, c-Myc rearrangement not detected, KI-67 20% She was not treated for lymphoma  PET 12/12/2014-markedly decreased size of hypermetabolic mesenteric mass, measuring 2.3 x 3.8 cm compared to 4.8 x 7.9 cm with SUV 7.2 compared to 18.9 hypermetabolic activity in the region of the retroperitoneal reflection no longer evident, hypermetabolic soft tissue track extending from the bladder dome to the periumbilical region PET 07/31/2015-previously noted hypermetabolic mesenteric mass no longer visualized, no hypermetabolic lymphadenopathy or extranodal disease, midline subincisional fluid collection measuring 3 x 6 cm CT abdomen/pelvis 10/16/2020-compared to 05/29/2014 CT, no change in left lateral liver hypodensity, abnormal mesenteric adenopathy in the  left abdomen at the site of a previous mesenteric mass, representative node measuring 15 mm, majority of nodes are 5-10 mm, no retroperitoneal or  upper abdominal adenopathy, no pelvic sidewall or inguinal adenopathy CT abdomen/pelvis 09/28/2021-no change in mesenteric adenopathy, clearing of haziness in the mesentery, no new lymph nodes CT abdomen/pelvis 10/01/2022-no change in mesenteric adenopathy, prominent stool throughout the colon CT abdomen/pelvis 02/02/2023-stable mild mesenteric lymphadenopathy, new short segment of small bowel thickening in the right abdomen with adjacent stranding 2.  coronary artery disease 3.  Anxiety and depression 4.  Hyperlipidemia 5.  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 appears stable.  There is no clinical evidence for progression of lymphoma.  I reviewed the 02/02/2023 CT findings and images with [name] and her husband.  The area of small bowel thickening in the right abdomen is nonspecific.  This could represent a lymphoma or another process.  Abdominal pain has resolved.  She will be scheduled for an office visit and repeat CT abdomen/pelvis in February.  She will call in the interim for new symptoms. She will continue follow-up with Dr. Leone Payor for management of constipation and colon cancer screening.  Thornton Papas, MD  03/21/2023  8:13 AM

## 2023-03-30 DIAGNOSIS — I10 Essential (primary) hypertension: Secondary | ICD-10-CM | POA: Diagnosis not present

## 2023-03-30 DIAGNOSIS — Z299 Encounter for prophylactic measures, unspecified: Secondary | ICD-10-CM | POA: Diagnosis not present

## 2023-03-30 DIAGNOSIS — R5383 Other fatigue: Secondary | ICD-10-CM | POA: Diagnosis not present

## 2023-03-30 DIAGNOSIS — E7800 Pure hypercholesterolemia, unspecified: Secondary | ICD-10-CM | POA: Diagnosis not present

## 2023-03-30 DIAGNOSIS — Z79899 Other long term (current) drug therapy: Secondary | ICD-10-CM | POA: Diagnosis not present

## 2023-03-30 DIAGNOSIS — Z0000 Encounter for general adult medical examination without abnormal findings: Secondary | ICD-10-CM | POA: Diagnosis not present

## 2023-04-06 DIAGNOSIS — E7800 Pure hypercholesterolemia, unspecified: Secondary | ICD-10-CM | POA: Diagnosis not present

## 2023-04-15 DIAGNOSIS — L659 Nonscarring hair loss, unspecified: Secondary | ICD-10-CM | POA: Diagnosis not present

## 2023-04-15 DIAGNOSIS — R7303 Prediabetes: Secondary | ICD-10-CM | POA: Diagnosis not present

## 2023-04-15 DIAGNOSIS — I10 Essential (primary) hypertension: Secondary | ICD-10-CM | POA: Diagnosis not present

## 2023-05-11 DIAGNOSIS — B078 Other viral warts: Secondary | ICD-10-CM | POA: Diagnosis not present

## 2023-05-11 DIAGNOSIS — L738 Other specified follicular disorders: Secondary | ICD-10-CM | POA: Diagnosis not present

## 2023-05-11 DIAGNOSIS — L648 Other androgenic alopecia: Secondary | ICD-10-CM | POA: Diagnosis not present

## 2023-05-13 ENCOUNTER — Telehealth: Payer: Self-pay | Admitting: *Deleted

## 2023-05-13 NOTE — Telephone Encounter (Signed)
Called patient to inform her of lab/CT scan appointments scheduled for 2/18. She declines and has asked to move all her appointments to July. MD notified.

## 2023-06-03 ENCOUNTER — Ambulatory Visit: Payer: Medicare HMO | Admitting: Oncology

## 2023-06-03 ENCOUNTER — Other Ambulatory Visit: Payer: Medicare HMO

## 2023-06-03 DIAGNOSIS — H524 Presbyopia: Secondary | ICD-10-CM | POA: Diagnosis not present

## 2023-06-08 DIAGNOSIS — I10 Essential (primary) hypertension: Secondary | ICD-10-CM | POA: Diagnosis not present

## 2023-06-08 DIAGNOSIS — R350 Frequency of micturition: Secondary | ICD-10-CM | POA: Diagnosis not present

## 2023-06-08 DIAGNOSIS — Z299 Encounter for prophylactic measures, unspecified: Secondary | ICD-10-CM | POA: Diagnosis not present

## 2023-06-08 DIAGNOSIS — N390 Urinary tract infection, site not specified: Secondary | ICD-10-CM | POA: Diagnosis not present

## 2023-06-08 DIAGNOSIS — M545 Low back pain, unspecified: Secondary | ICD-10-CM | POA: Diagnosis not present

## 2023-06-10 ENCOUNTER — Emergency Department (HOSPITAL_COMMUNITY): Payer: Medicare HMO

## 2023-06-10 ENCOUNTER — Other Ambulatory Visit: Payer: Self-pay

## 2023-06-10 ENCOUNTER — Inpatient Hospital Stay (HOSPITAL_COMMUNITY)
Admission: EM | Admit: 2023-06-10 | Discharge: 2023-06-13 | DRG: 389 | Disposition: A | Discharge status: Home / Self Care | Payer: Medicare HMO | Attending: Family Medicine | Admitting: Family Medicine

## 2023-06-10 ENCOUNTER — Inpatient Hospital Stay (HOSPITAL_COMMUNITY): Payer: Medicare HMO

## 2023-06-10 ENCOUNTER — Encounter (HOSPITAL_COMMUNITY): Payer: Self-pay

## 2023-06-10 DIAGNOSIS — K5909 Other constipation: Secondary | ICD-10-CM | POA: Diagnosis present

## 2023-06-10 DIAGNOSIS — K5904 Chronic idiopathic constipation: Secondary | ICD-10-CM | POA: Diagnosis not present

## 2023-06-10 DIAGNOSIS — E785 Hyperlipidemia, unspecified: Secondary | ICD-10-CM

## 2023-06-10 DIAGNOSIS — K6389 Other specified diseases of intestine: Secondary | ICD-10-CM | POA: Diagnosis not present

## 2023-06-10 DIAGNOSIS — I2510 Atherosclerotic heart disease of native coronary artery without angina pectoris: Secondary | ICD-10-CM | POA: Diagnosis not present

## 2023-06-10 DIAGNOSIS — K769 Liver disease, unspecified: Secondary | ICD-10-CM | POA: Diagnosis not present

## 2023-06-10 DIAGNOSIS — C8580 Other specified types of non-Hodgkin lymphoma, unspecified site: Secondary | ICD-10-CM | POA: Diagnosis not present

## 2023-06-10 DIAGNOSIS — F419 Anxiety disorder, unspecified: Secondary | ICD-10-CM | POA: Diagnosis not present

## 2023-06-10 DIAGNOSIS — Z7982 Long term (current) use of aspirin: Secondary | ICD-10-CM

## 2023-06-10 DIAGNOSIS — C8590 Non-Hodgkin lymphoma, unspecified, unspecified site: Secondary | ICD-10-CM | POA: Diagnosis not present

## 2023-06-10 DIAGNOSIS — Z818 Family history of other mental and behavioral disorders: Secondary | ICD-10-CM

## 2023-06-10 DIAGNOSIS — Z8249 Family history of ischemic heart disease and other diseases of the circulatory system: Secondary | ICD-10-CM

## 2023-06-10 DIAGNOSIS — R188 Other ascites: Secondary | ICD-10-CM | POA: Diagnosis present

## 2023-06-10 DIAGNOSIS — K5669 Other partial intestinal obstruction: Secondary | ICD-10-CM | POA: Diagnosis not present

## 2023-06-10 DIAGNOSIS — Z79899 Other long term (current) drug therapy: Secondary | ICD-10-CM

## 2023-06-10 DIAGNOSIS — I700 Atherosclerosis of aorta: Secondary | ICD-10-CM | POA: Diagnosis not present

## 2023-06-10 DIAGNOSIS — Z825 Family history of asthma and other chronic lower respiratory diseases: Secondary | ICD-10-CM

## 2023-06-10 DIAGNOSIS — Z823 Family history of stroke: Secondary | ICD-10-CM | POA: Diagnosis not present

## 2023-06-10 DIAGNOSIS — E860 Dehydration: Secondary | ICD-10-CM | POA: Diagnosis not present

## 2023-06-10 DIAGNOSIS — E7800 Pure hypercholesterolemia, unspecified: Secondary | ICD-10-CM | POA: Diagnosis present

## 2023-06-10 DIAGNOSIS — R55 Syncope and collapse: Secondary | ICD-10-CM | POA: Diagnosis present

## 2023-06-10 DIAGNOSIS — R1084 Generalized abdominal pain: Secondary | ICD-10-CM | POA: Diagnosis not present

## 2023-06-10 DIAGNOSIS — Z800 Family history of malignant neoplasm of digestive organs: Secondary | ICD-10-CM

## 2023-06-10 DIAGNOSIS — Z8572 Personal history of non-Hodgkin lymphomas: Secondary | ICD-10-CM | POA: Diagnosis not present

## 2023-06-10 DIAGNOSIS — Z01810 Encounter for preprocedural cardiovascular examination: Secondary | ICD-10-CM | POA: Diagnosis not present

## 2023-06-10 DIAGNOSIS — K5651 Intestinal adhesions [bands], with partial obstruction: Principal | ICD-10-CM | POA: Diagnosis present

## 2023-06-10 DIAGNOSIS — I10 Essential (primary) hypertension: Secondary | ICD-10-CM | POA: Diagnosis not present

## 2023-06-10 DIAGNOSIS — Z955 Presence of coronary angioplasty implant and graft: Secondary | ICD-10-CM

## 2023-06-10 DIAGNOSIS — R935 Abnormal findings on diagnostic imaging of other abdominal regions, including retroperitoneum: Secondary | ICD-10-CM

## 2023-06-10 DIAGNOSIS — J984 Other disorders of lung: Secondary | ICD-10-CM | POA: Diagnosis not present

## 2023-06-10 DIAGNOSIS — F32A Depression, unspecified: Secondary | ICD-10-CM

## 2023-06-10 DIAGNOSIS — K56609 Unspecified intestinal obstruction, unspecified as to partial versus complete obstruction: Principal | ICD-10-CM | POA: Diagnosis present

## 2023-06-10 DIAGNOSIS — K529 Noninfective gastroenteritis and colitis, unspecified: Secondary | ICD-10-CM | POA: Diagnosis present

## 2023-06-10 DIAGNOSIS — I252 Old myocardial infarction: Secondary | ICD-10-CM

## 2023-06-10 DIAGNOSIS — R109 Unspecified abdominal pain: Secondary | ICD-10-CM | POA: Diagnosis not present

## 2023-06-10 DIAGNOSIS — R1010 Upper abdominal pain, unspecified: Secondary | ICD-10-CM | POA: Diagnosis not present

## 2023-06-10 DIAGNOSIS — K219 Gastro-esophageal reflux disease without esophagitis: Secondary | ICD-10-CM | POA: Diagnosis present

## 2023-06-10 DIAGNOSIS — Z881 Allergy status to other antibiotic agents status: Secondary | ICD-10-CM

## 2023-06-10 DIAGNOSIS — R933 Abnormal findings on diagnostic imaging of other parts of digestive tract: Secondary | ICD-10-CM | POA: Diagnosis not present

## 2023-06-10 DIAGNOSIS — Z882 Allergy status to sulfonamides status: Secondary | ICD-10-CM | POA: Diagnosis not present

## 2023-06-10 LAB — COMPREHENSIVE METABOLIC PANEL
ALT: 33 U/L (ref 0–44)
AST: 37 U/L (ref 15–41)
Albumin: 4.8 g/dL (ref 3.5–5.0)
Alkaline Phosphatase: 124 U/L (ref 38–126)
Anion gap: 13 (ref 5–15)
BUN: 16 mg/dL (ref 8–23)
CO2: 23 mmol/L (ref 22–32)
Calcium: 9.6 mg/dL (ref 8.9–10.3)
Chloride: 101 mmol/L (ref 98–111)
Creatinine, Ser: 0.72 mg/dL (ref 0.44–1.00)
GFR, Estimated: >60 mL/min (ref >60)
Glucose, Bld: 128 mg/dL — ABNORMAL HIGH (ref 70–99)
Potassium: 4.2 mmol/L (ref 3.5–5.1)
Sodium: 137 mmol/L (ref 135–145)
Total Bilirubin: 1.1 mg/dL (ref 0.0–1.2)
Total Protein: 7.3 g/dL (ref 6.5–8.1)

## 2023-06-10 LAB — URINALYSIS, W/ REFLEX TO CULTURE (INFECTION SUSPECTED)
Bacteria, UA: NONE SEEN
Bilirubin Urine: NEGATIVE
Glucose, UA: NEGATIVE mg/dL
Hgb urine dipstick: NEGATIVE
Ketones, ur: NEGATIVE mg/dL
Leukocytes,Ua: NEGATIVE
Nitrite: NEGATIVE
Protein, ur: NEGATIVE mg/dL
Specific Gravity, Urine: 1.026 (ref 1.005–1.030)
pH: 8 (ref 5.0–8.0)

## 2023-06-10 LAB — CBC WITH DIFFERENTIAL/PLATELET
Abs Immature Granulocytes: 0.03 10*3/uL (ref 0.00–0.07)
Basophils Absolute: 0 10*3/uL (ref 0.0–0.1)
Basophils Relative: 0 %
Eosinophils Absolute: 0.1 10*3/uL (ref 0.0–0.5)
Eosinophils Relative: 1 %
HCT: 46.4 % — ABNORMAL HIGH (ref 36.0–46.0)
Hemoglobin: 16 g/dL — ABNORMAL HIGH (ref 12.0–15.0)
Immature Granulocytes: 0 %
Lymphocytes Relative: 10 %
Lymphs Abs: 1 10*3/uL (ref 0.7–4.0)
MCH: 33.2 pg (ref 26.0–34.0)
MCHC: 34.5 g/dL (ref 30.0–36.0)
MCV: 96.3 fL (ref 80.0–100.0)
Monocytes Absolute: 0.5 10*3/uL (ref 0.1–1.0)
Monocytes Relative: 5 %
Neutro Abs: 8.5 10*3/uL — ABNORMAL HIGH (ref 1.7–7.7)
Neutrophils Relative %: 84 %
Platelets: 211 10*3/uL (ref 150–400)
RBC: 4.82 MIL/uL (ref 3.87–5.11)
RDW: 12.6 % (ref 11.5–15.5)
WBC: 10.1 10*3/uL (ref 4.0–10.5)
nRBC: 0 % (ref 0.0–0.2)

## 2023-06-10 LAB — LIPASE, BLOOD: Lipase: 27 U/L (ref 11–51)

## 2023-06-10 LAB — LACTIC ACID, PLASMA
Lactic Acid, Venous: 1.6 mmol/L (ref 0.5–1.9)
Lactic Acid, Venous: 1.5 mmol/L (ref 0.5–1.9)
Lactic Acid, Venous: 0.8 mmol/L (ref 0.5–1.9)

## 2023-06-10 LAB — TROPONIN I (HIGH SENSITIVITY)
Troponin I (High Sensitivity): <2 ng/L (ref <18)
Troponin I (High Sensitivity): <2 ng/L (ref <18)

## 2023-06-10 MED ORDER — ALBUTEROL SULFATE (2.5 MG/3ML) 0.083% IN NEBU: 2.5000 mg | INHALATION_SOLUTION | RESPIRATORY_TRACT | Status: DC | PRN

## 2023-06-10 MED ORDER — ONDANSETRON HCL 4 MG/2ML IJ SOLN
4.0000 mg | Freq: Once | INTRAMUSCULAR | Status: AC
  Administered 2023-06-10: 4 mg via INTRAVENOUS
  Filled 2023-06-10: qty 2

## 2023-06-10 MED ORDER — ONDANSETRON HCL 4 MG PO TABS: 4.0000 mg | ORAL_TABLET | Freq: Four times a day (QID) | ORAL | Status: DC | PRN

## 2023-06-10 MED ORDER — HYDRALAZINE HCL 20 MG/ML IJ SOLN: 5.0000 mg | INTRAMUSCULAR | Status: DC | PRN

## 2023-06-10 MED ORDER — SODIUM CHLORIDE 0.9 % IV BOLUS
1000.0000 mL | Freq: Once | INTRAVENOUS | Status: AC
  Administered 2023-06-10: 1000 mL via INTRAVENOUS

## 2023-06-10 MED ORDER — HEPARIN SODIUM (PORCINE) 5000 UNIT/ML IJ SOLN
5000.0000 [IU] | Freq: Three times a day (TID) | INTRAMUSCULAR | Status: DC
  Administered 2023-06-10 – 2023-06-11 (×2): 5000 [IU] via SUBCUTANEOUS
  Filled 2023-06-10 (×2): qty 1

## 2023-06-10 MED ORDER — MORPHINE SULFATE (PF) 4 MG/ML IV SOLN
4.0000 mg | Freq: Once | INTRAVENOUS | Status: AC
  Administered 2023-06-10: 4 mg via INTRAVENOUS
  Filled 2023-06-10: qty 1

## 2023-06-10 MED ORDER — MORPHINE SULFATE (PF) 2 MG/ML IV SOLN
2.0000 mg | INTRAVENOUS | Status: DC | PRN
  Administered 2023-06-10 – 2023-06-12 (×5): 2 mg via INTRAVENOUS
  Filled 2023-06-10 (×5): qty 1

## 2023-06-10 MED ORDER — IOHEXOL 300 MG/ML  SOLN
100.0000 mL | Freq: Once | INTRAMUSCULAR | Status: AC | PRN
  Administered 2023-06-10: 100 mL via INTRAVENOUS

## 2023-06-10 MED ORDER — ALPRAZOLAM 0.5 MG PO TABS
0.5000 mg | ORAL_TABLET | Freq: Every evening | ORAL | Status: DC | PRN
  Filled 2023-06-10: qty 1

## 2023-06-10 MED ORDER — LACTATED RINGERS IV SOLN: INTRAVENOUS | Status: AC

## 2023-06-10 MED ORDER — ONDANSETRON HCL 4 MG/2ML IJ SOLN
4.0000 mg | Freq: Four times a day (QID) | INTRAMUSCULAR | Status: DC | PRN
Start: 2023-06-10 — End: 2023-06-13
  Administered 2023-06-10: 4 mg via INTRAVENOUS
  Filled 2023-06-10: qty 2

## 2023-06-10 NOTE — ED Provider Notes (Signed)
 3:31 PM Assumed care of patient from off-going team. For more details, please see note from same day.  In brief, this is a 63 y.o. female with h/o MI, h/o non hodgkin's lymphoma. Recently diagnosed w/ UTI, had initial abx but then they changed it to cipro, has had one dose. Today started having abdominal pain and a near-syncopal event.   Plan/Dispo at time of sign-out & ED Course since sign-out: [ ]  CT, CXR, UA  BP 127/78 (BP Location: Left Arm)   Pulse 79   Temp 98.8 F (37.1 C) (Oral)   Resp 16   Ht 5\' 4"  (1.626 m)   Wt 65.3 kg   LMP 10/20/2011   SpO2 95%   BMI 24.72 kg/m    ED Course:   Clinical Course as of 06/11/23 0228  Fri Jun 10, 2023  1707 CT ABDOMEN PELVIS W CONTRAST 1. Dilated loops of small bowel with scattered air-fluid levels extending to a transition in the right abdomen where there is suspected wall thickening in a bowel loop at least over a 4.5 cm segment, subsequent mildly angulated turning of the loop, and possible mild accentuated mucosal enhancement in several loops of small bowel distal to this process. Appearance concerning for early or partial small bowel obstruction associated with inflamed or thick-walled loops of small bowel. No pneumatosis although there is some scattered ascites along the involved loops of small bowel. No extraluminal gas. No portal venous gas. 2. Prominence of stool in the distal sigmoid colon and rectum, with some potential wall thickening and accentuated mucosal enhancement in the proximal sigmoid colon. The potential for multifocal inflammation including the involved small bowel segments and proximal sigmoid colon raise the possibility of Crohn's disease. 3. Stable pathologic mesenteric adenopathy, compatible with patient's history of lymphoma. 4. Stable scarring in both lower lobes and in the right middle lobe. 5. Stable 0.6 by 0.3 cm nonspecific lesion in segment 3 of the liver. No further imaging workup of this lesion is  indicated. 6. Minimal aortic atherosclerosis.   [HN]    Clinical Course User Index [HN] Loetta Rough, MD    Dispo: Patient is found to have partial small bowel obstruction.  She also has possible thickening of the bowel and colon.  Consulted to surgery Dr. Lovell Sheehan who will see the patient in the morning.  Ordered NG tube and patient is admitted to medicine. ------------------------------- Vivi Barrack, MD Emergency Medicine  This note was created using dictation software, which may contain spelling or grammatical errors.   Loetta Rough, MD 06/11/23 [phone]

## 2023-06-10 NOTE — H&P (Signed)
TRH H&P   Patient Demographics:    Gabrielle [name], is a 63 y.o. female  MRN: [id number]   DOB - [birth year]  Admit Date - 06/10/2023  Outpatient Primary MD for the patient is Kirstie Peri, MD  Referring MD/NP/PA: Dr. Jearld Fenton  Outpatient Specialists: Oncology Dr. Alcide Evener, GI Dr. Leone Payor, primary cardiologist at [facility]  Patient coming from: Home  Chief Complaint  Patient presents with   Abdominal Pain      HPI:    Gabrielle [name]  is a 63 y.o. female, with past medical history of CAD, diffuse large B-cell lymphoma, GERD, hyperlipidemia. -Patient presents to ED secondary to complaints of back pain, abdominal pain, reports pain was started yesterday, does report nausea, she denies vomiting, she is passing gas and stool, does not report any increased belching, reports similar event last October, resolved with milk of magnesia, but this 1 is more intense and constant, any fever, chills, bright red blood per rectum, no melena, no coffee-ground emesis, patient supposed to have colonoscopy couple months ago, but per husband could not be done due to bad weather, they have not rescheduled yet.  During last event in October 2024, she had CT abdomen pelvis which was significant for small segment thickening of small bowel, she was seen by her oncologist with plan to repeat imaging in February. -ED patient afebrile, no leukocytosis, no significant labs abnormalities other than elevated hemoglobin most likely due to dehydration, CT abdomen pelvis with significant small bowel 4.5 cm segments thickening, and proximal colon segment thickening as well, with concern for early versus partial SBO, ED discussed with general surgery who recommended NG tube insertion, and to start suction, [facility] consulted to admit.    Review of systems:    A full 10 point Review of Systems was done, except as stated above,  all other Review of Systems were negative.   With Past History of the following :    Past Medical History:  Diagnosis Date   Anxiety and depression    CAD (coronary artery disease)    MI + stent 2018   CHF (congestive heart failure) (HCC) 01/12/2023   ef 25% to 30%   Colon cancer screening 04/2019   Cologuard NEG.  Repeat 04/2022   Diffuse large B cell lymphoma (HCC) 2018   NHL-L peritoneal mesenteric mass-->no evidence of aggressive lymphoma as of 02/2021 hem/onc f/u   Dyspepsia    GERD (gastroesophageal reflux disease)    History of myocardial infarction 2018   Hx of migraines    remote past   Hypercholesterolemia    NHL (non-Hodgkin's lymphoma) (HCC) 2016   left peritoneal mesonteric mass   Palpitations 2015   no monitoring was done-->resolved spontaneously      Past Surgical History:  Procedure Laterality Date   biopsy  2016   Initial bx needle; then lab abd (done in [facility]) done to get further  bx, exp lap--complications--sepsis/hemorrhage--memory/cognitive difficulties since that surgery/hospitalization--details somewhat cloudy from pt's info.   BREAST ENHANCEMENT SURGERY  2003   Heart Stent  2018   Prox LAD (murrells inlet).   INCISIONAL HERNIA REPAIR  2017   w/mesh   TONSILLECTOMY     TRANSTHORACIC ECHOCARDIOGRAM  09/2013   2015 ALL NORMAL.  2020 normal per pt report (Drr. Adella Hare with [facility] [place]   WISDOM TOOTH EXTRACTION        Social History:     Social History   Tobacco Use   Smoking status: Never   Smokeless tobacco: Never  Substance Use Topics   Alcohol use: No       Family History :     Family History  Problem Relation Age of Onset   Heart disease Mother    Emphysema Mother    Hypertension Mother    Stroke Mother    Depression Mother    Heart failure Mother    Hypertension Brother    Depression Brother    Stroke Maternal Aunt    Depression Maternal Aunt    CAD Maternal Aunt    Colon cancer Other      Home Medications:    Prior to Admission medications   Medication Sig Start Date End Date Taking? Authorizing Provider  ALPRAZolam Prudy Feeler) 0.5 MG tablet Take 1 tablet (0.5 mg total) by mouth 3 (three) times daily. Patient taking differently: Take 0.5 mg by mouth at bedtime as needed for sleep. 06/19/21  Yes McGowen, Maryjean Morn, MD  aspirin EC 81 MG tablet Take 81 mg by mouth daily. 03/12/19  Yes [provider]  atorvastatin (LIPITOR) 80 MG tablet TAKE 1 TABLET EVERY DAY 12/02/21  Yes McGowen, Maryjean Morn, MD  ciprofloxacin (CIPRO) 250 MG tablet Take 250 mg by mouth 2 (two) times daily. 06/10/23  Yes [provider]  ezetimibe (ZETIA) 10 MG tablet Take 1 tablet (10 mg total) by mouth daily. 06/19/21  Yes McGowen, Maryjean Morn, MD  lamoTRIgine (LAMICTAL) 200 MG tablet Take 200 mg by mouth daily. 03/12/19  Yes [provider]  Magnesium Hydroxide (MILK OF MAGNESIA PO) Take 15 mLs by mouth every other day.   Yes [provider]  metoprolol succinate (TOPROL-XL) 25 MG 24 hr tablet TAKE 1/2 TABLET ONE TIME DAILY 06/19/21  Yes McGowen, Maryjean Morn, MD  Multiple Vitamins-Minerals (MULTIVITAMIN ADULT PO) Take by mouth daily.   Yes [provider]  naproxen sodium (ALEVE) 220 MG tablet Take 220 mg by mouth daily as needed (back pain).   Yes [provider]  nitroGLYCERIN (NITROSTAT) 0.4 MG SL tablet Place 0.4 mg under the tongue every 5 (five) minutes as needed for chest pain. 05/25/21  Yes [provider]  PARoxetine (PAXIL) 40 MG tablet Take 1 tablet (40 mg total) by mouth daily. 12/04/21  Yes McGowen, Maryjean Morn, MD     Allergies:     Allergies  Allergen Reactions   Cephalosporins Anaphylaxis   Rocephin [Ceftriaxone] Anaphylaxis, Hives and Shortness Of Breath    Facial numbness   Sulfa Antibiotics Itching and Rash     Physical Exam:   Vitals  Blood pressure 132/74, pulse 84, temperature 98.1 F (36.7 C), temperature source Oral, resp. rate 16, height 5\' 4"  (1.626 m),  weight 65.3 kg, last menstrual period 10/20/2011, SpO2 95%.   1. General Frail female, laying in bed in mild discomfort due to abdominal pain  2. Normal affect and insight, Not Suicidal or Homicidal, Awake Alert, Oriented  X 3.  3. No F.N deficits, ALL C.Nerves Intact, Strength 5/5 all 4 extremities, Sensation intact all 4 extremities, Plantars down going.  4. Ears and Eyes appear Normal, Conjunctivae clear, PERRLA. Moist Oral Mucosa.  5. Supple Neck, No JVD, No Carotid Bruits.  6. Symmetrical Chest wall movement, Good air movement bilaterally, CTAB.  7. RRR, No Gallops, Rubs or Murmurs, No Parasternal Heave.  8. Positive Bowel Sounds, Abdomen Soft, tenderness to palpation in upper abdomen and left lower quadrant area,, No organomegaly appriciated,No rebound -guarding or rigidity.  9.  No Cyanosis, Normal Skin Turgor, No Skin Rash or Bruise.  10. Good muscle tone,  joints appear normal , no effusions, Normal ROM.     Data Review:    CBC Recent Labs  Lab 06/10/23 1427  WBC 10.1  HGB 16.0*  HCT 46.4*  PLT 211  MCV 96.3  MCH 33.2  MCHC 34.5  RDW 12.6  LYMPHSABS 1.0  MONOABS 0.5  EOSABS 0.1  BASOSABS 0.0   ------------------------------------------------------------------------------------------------------------------  Chemistries  Recent Labs  Lab 06/10/23 1427  NA 137  K 4.2  CL 101  CO2 23  GLUCOSE 128*  BUN 16  CREATININE 0.72  CALCIUM 9.6  AST 37  ALT 33  ALKPHOS 124  BILITOT 1.1   ------------------------------------------------------------------------------------------------------------------ estimated creatinine clearance is 63 mL/min (by C-G formula based on SCr of 0.72 mg/dL). ------------------------------------------------------------------------------------------------------------------ No results for input(s): "TSH", "T4TOTAL", "T3FREE", "THYROIDAB" in the last 72 hours.  Invalid input(s): "FREET3"  Coagulation profile No results for  input(s): "INR", "PROTIME" in the last 168 hours. ------------------------------------------------------------------------------------------------------------------- No results for input(s): "DDIMER" in the last 72 hours. -------------------------------------------------------------------------------------------------------------------  Cardiac Enzymes No results for input(s): "CKMB", "TROPONINI", "MYOGLOBIN" in the last 168 hours.  Invalid input(s): "CK" ------------------------------------------------------------------------------------------------------------------ No results found for: "BNP"   ---------------------------------------------------------------------------------------------------------------  Urinalysis    Component Value Date/Time   COLORURINE YELLOW 06/10/2023 1419   APPEARANCEUR HAZY (A) 06/10/2023 1419   LABSPEC 1.026 06/10/2023 1419   PHURINE 8.0 06/10/2023 1419   GLUCOSEU NEGATIVE 06/10/2023 1419   GLUCOSEU 100 (A) 08/18/2021 1124   HGBUR NEGATIVE 06/10/2023 1419   BILIRUBINUR NEGATIVE 06/10/2023 1419   BILIRUBINUR Positive 08/18/2021 1139   KETONESUR NEGATIVE 06/10/2023 1419   PROTEINUR NEGATIVE 06/10/2023 1419   UROBILINOGEN 2.0 (A) 08/18/2021 1139   UROBILINOGEN 4.0 (A) 08/18/2021 1124   NITRITE NEGATIVE 06/10/2023 1419   LEUKOCYTESUR NEGATIVE 06/10/2023 1419    ----------------------------------------------------------------------------------------------------------------   Imaging Results:    CT ABDOMEN PELVIS W CONTRAST Result Date: 06/10/2023 CLINICAL DATA:  Non-Hodgkin's lymphoma.  Abdominal pain. * Tracking Code: BO * EXAM: CT ABDOMEN AND PELVIS WITH CONTRAST TECHNIQUE: Multidetector CT imaging of the abdomen and pelvis was performed using the standard protocol following bolus administration of intravenous contrast. RADIATION DOSE REDUCTION: This exam was performed according to the departmental dose-optimization program which includes automated  exposure control, adjustment of the mA and/or kV according to patient size and/or use of iterative reconstruction technique. CONTRAST:  OMNIPAQUE IOHEXOL 300 MG/ML  SOLN COMPARISON:  02/02/2023 FINDINGS: Lower chest: Bilateral breast implants. Stable scarring in both lower lobes and in the right middle lobe. Hepatobiliary: Stable 0.6 by 0.3 cm nonspecific lesion in segment 3 of the liver on image 21 series 2. No further imaging workup of this lesion is indicated. Gallbladder unremarkable. Pancreas: Unremarkable Spleen: Unremarkable Adrenals/Urinary Tract: Unremarkable Stomach/Bowel: Dilated loops of bowel with scattered air-fluid levels extending to a transition in the right abdomen shown on image 35 series 3 where there is  suspected wall thickening in a bowel loop at least over a 4.5 cm segment, subsequent mildly angulated turning of the loop, and possible mild accentuated mucosal enhancement in several loops of small bowel distal to this process. Appearance concerning for early or partial small bowel obstruction associated with inflamed or thick-walled loops of small bowel. No pneumatosis although there is some scattered ascites along the involved loops of small bowel. No extraluminal gas. No portal venous gas. Prominence of stool in the distal sigmoid colon and rectum, with some potential wall thickening and accentuated mucosal enhancement in the proximal sigmoid colon for example on image 35 series 3. Vascular/Lymphatic: There is only minimal aortic atherosclerosis. Pathologic mesenteric adenopathy is observed, index node 1.5 cm in short axis on image 41 series 2, stable. Reproductive: Unremarkable Other: Mild ascites along the dilated loops of small bowel. Musculoskeletal: Unremarkable IMPRESSION: 1. Dilated loops of small bowel with scattered air-fluid levels extending to a transition in the right abdomen where there is suspected wall thickening in a bowel loop at least over a 4.5 cm segment, subsequent  mildly angulated turning of the loop, and possible mild accentuated mucosal enhancement in several loops of small bowel distal to this process. Appearance concerning for early or partial small bowel obstruction associated with inflamed or thick-walled loops of small bowel. No pneumatosis although there is some scattered ascites along the involved loops of small bowel. No extraluminal gas. No portal venous gas. 2. Prominence of stool in the distal sigmoid colon and rectum, with some potential wall thickening and accentuated mucosal enhancement in the proximal sigmoid colon. The potential for multifocal inflammation including the involved small bowel segments and proximal sigmoid colon raise the possibility of Crohn's disease. 3. Stable pathologic mesenteric adenopathy, compatible with patient's history of lymphoma. 4. Stable scarring in both lower lobes and in the right middle lobe. 5. Stable 0.6 by 0.3 cm nonspecific lesion in segment 3 of the liver. No further imaging workup of this lesion is indicated. 6. Minimal aortic atherosclerosis. Electronically Signed   By: Gaylyn Rong M.D.   On: 06/10/2023 16:44   DG Chest Port 1 View Result Date: 06/10/2023 CLINICAL DATA:  Upper abdominal pain, non-Hodgkin's lymphoma EXAM: PORTABLE CHEST 1 VIEW COMPARISON:  Overlapping portions CT abdomen 02/02/2023 FINDINGS: Scarring in both lung bases. Cardiac and mediastinal margins appear normal. No blunting of the costophrenic angles. No significant osseous abnormalities observed. IMPRESSION: 1. Scarring in both lung bases. No acute findings. Electronically Signed   By: Gaylyn Rong M.D.   On: 06/10/2023 16:33     EKG:  Vent. rate 79 BPM PR interval 166 ms QRS duration 89 ms QT/QTcB 379/435 ms P-R-T axes 67 46 30 Sinus rhythm  Assessment & Plan:    Principal Problem:   SBO (small bowel obstruction) (HCC) Active Problems:   Chronic coronary artery disease   Diffuse non-Hodgkin's lymphoma  (HCC)    Abdominal pain, nausea secondary to partial SBO versus early SBO -CT abdomen pelvis finding concerning for early SBO versus partial SBO, due to small bowel loops showed signal wall thickening, and proximal sigmoid colon wall thickening. -Will proceed with NG tube with ILS per general surgery recommendation -Keep n.p.o. overnight, on IV fluids, as needed pain and nausea medications -General Surgery to see in a.m. -Will consult gastroenterology as well, likely at 1 point patient will need biopsy, differential includes infectious versus inflammatory versus Phoma. -Missed her colonoscopy 2 months ago due to bad weather, she has not rescheduled yet  Diffuse non-Hodgkin  lymphoma -Stable mesenteric adenopathy on repeat imaging, patient is following with Dr. Jarrett Ables, plan was to repeat CT abdomen pelvis in February  History of CAD chronic: s/p MI and DES LAD 2018.  -She denies any chest pain or shortness of breath -Resume aspirin, statin, beta-blockers when able to tolerate oral -Does look in some records and documentation that patient had 2D echo showing low EF 20 to 25%, I could not see any documentation or evidence of this echo on care everywhere or hospital records, patient/husband deny having any echo done last year, so I will go ahead and repeat echo as likely she will need it before moving forward with any procedure if indicated.  Hypertension -Blood pressure is soft acceptable, hold Toprol-XL for now, will keep on as needed hydralazine meanwhile  Hyperlipidemia -resume Zetia and statin when able to take oral  History of anxiety/depression -Resume Paxil, Lamictal when able to take oral, on as needed sublingual Xanax  DVT Prophylaxis Heparin   AM Labs Ordered, also please review Full Orders  Family Communication: Admission, patients condition and plan of care including tests being ordered have been discussed with the patient and husband at bedside* who indicate understanding  and agree with the plan and Code Status.  Code Status full code  Likely DC to home  Consults called: GI called by ED, will request GI consult on epic  Admission status: Inpatient  Time spent in minutes : 70 minutes   Huey Bienenstock M.D on 06/10/2023 at 7:21 PM   [facility] - Office  [phone]

## 2023-06-10 NOTE — ED Provider Notes (Signed)
 [place] EMERGENCY DEPARTMENT AT [facility] Provider Note   CSN: [id number] Arrival date & time: 06/10/23  1335     History  Chief Complaint  Patient presents with   Abdominal Pain    Gabrielle [name] is a 63 y.o. female.  She is here with a complaint of back and abdominal pain that started yesterday.  She said she has had the back pain before but it was more than usual.  Saw her primary care doctor and they told her it was a urinary tract infection and started on antibiotic, which she took 2 doses yesterday.  Today the back pain began to radiate into her abdomen and is now mostly in her lower abdomen rating up under her diaphragm.  Associated with nausea.  No vomiting or diarrhea no fevers or shortness of breath.  She was going to come to the hospital today but when she stood up she was very lightheaded like she might pass out and so they called the ambulance.  She has a history of non-Hodgkin's lymphoma but currently is not undergoing surgery or chemotherapy, follows with Dr. Truett Perna.  The history is provided by the patient.  Abdominal Pain Pain location:  Generalized Pain quality: aching   Pain severity:  Severe Onset quality:  Gradual Duration:  2 days Timing:  Constant Progression:  Worsening Chronicity:  New Context: not trauma   Relieved by:  None tried Worsened by:  Nothing Ineffective treatments:  None tried Associated symptoms: nausea   Associated symptoms: no chest pain, no cough, no diarrhea, no dysuria, no fever, no hematemesis, no hematochezia, no hematuria, no shortness of breath and no vomiting        Home Medications Prior to Admission medications   Medication Sig Start Date End Date Taking? Authorizing Provider  ALPRAZolam Prudy Feeler) 0.5 MG tablet Take 1 tablet (0.5 mg total) by mouth 3 (three) times daily. 06/19/21   Jeoffrey Massed, MD  aspirin EC 81 MG tablet Take by mouth. 03/12/19   [provider]  atorvastatin (LIPITOR) 80 MG tablet  TAKE 1 TABLET EVERY DAY 12/02/21   McGowen, Maryjean Morn, MD  ezetimibe (ZETIA) 10 MG tablet Take 1 tablet (10 mg total) by mouth daily. 06/19/21   McGowen, Maryjean Morn, MD  lamoTRIgine (LAMICTAL) 200 MG tablet Take 200 mg by mouth daily. 03/12/19   [provider]  Magnesium Hydroxide (MILK OF MAGNESIA PO) Take by mouth every other day.    [provider]  metoprolol succinate (TOPROL-XL) 25 MG 24 hr tablet TAKE 1/2 TABLET ONE TIME DAILY 06/19/21   McGowen, Maryjean Morn, MD  Multiple Vitamins-Minerals (MULTIVITAMIN ADULT PO) Take by mouth daily.    [provider]  nitroGLYCERIN (NITROSTAT) 0.4 MG SL tablet Place under the tongue. Patient not taking: Reported on 03/21/2023 05/25/21   [provider]  omeprazole (PRILOSEC) 40 MG capsule Take 1 capsule (40 mg total) by mouth daily. 06/19/21   McGowen, Maryjean Morn, MD  PARoxetine (PAXIL) 40 MG tablet Take 1 tablet (40 mg total) by mouth daily. 12/04/21   McGowen, Maryjean Morn, MD  zolpidem (AMBIEN) 10 MG tablet Take 1 tablet (10 mg total) by mouth at bedtime as needed for sleep. 06/19/21 03/21/23  McGowenMaryjean Morn, MD      Allergies    Cephalosporins, Rocephin [ceftriaxone], and Sulfa antibiotics    Review of Systems   Review of Systems  Constitutional:  Negative for fever.  Respiratory:  Negative for cough and shortness of  breath.   Cardiovascular:  Negative for chest pain.  Gastrointestinal:  Positive for abdominal pain and nausea. Negative for diarrhea, hematemesis, hematochezia and vomiting.  Genitourinary:  Negative for dysuria and hematuria.  Musculoskeletal:  Positive for back pain.    Physical Exam Updated Vital Signs BP 127/78 (BP Location: Left Arm)   Pulse 79   Temp 98.8 F (37.1 C) (Oral)   Resp 16   Ht 5\' 4"  (1.626 m)   Wt 65.3 kg   LMP 10/20/2011   SpO2 95%   BMI 24.72 kg/m  Physical Exam Vitals and nursing note reviewed.  Constitutional:      General: She is not in acute distress.    Appearance:  Normal appearance. She is well-developed.  HENT:     Head: Normocephalic and atraumatic.  Eyes:     Conjunctiva/sclera: Conjunctivae normal.  Cardiovascular:     Rate and Rhythm: Normal rate and regular rhythm.     Heart sounds: No murmur heard. Pulmonary:     Effort: Pulmonary effort is normal. No respiratory distress.     Breath sounds: Normal breath sounds.  Abdominal:     Palpations: Abdomen is soft.     Tenderness: There is generalized abdominal tenderness. There is no guarding or rebound.  Musculoskeletal:        General: No deformity.     Cervical back: Neck supple.  Skin:    General: Skin is warm and dry.     Capillary Refill: Capillary refill takes less than 2 seconds.  Neurological:     General: No focal deficit present.     Mental Status: She is alert.     ED Results / Procedures / Treatments   Labs (all labs ordered are listed, but only abnormal results are displayed) Labs Reviewed  COMPREHENSIVE METABOLIC PANEL - Abnormal; Notable for the following components:      Result Value   Glucose, Bld 128 (*)    All other components within normal limits  CBC WITH DIFFERENTIAL/PLATELET - Abnormal; Notable for the following components:   Hemoglobin 16.0 (*)    HCT 46.4 (*)    Neutro Abs 8.5 (*)    All other components within normal limits  URINALYSIS, W/ REFLEX TO CULTURE (INFECTION SUSPECTED) - Abnormal; Notable for the following components:   APPearance HAZY (*)    All other components within normal limits  LIPASE, BLOOD  LACTIC ACID, PLASMA  TROPONIN I (HIGH SENSITIVITY)  TROPONIN I (HIGH SENSITIVITY)    EKG None  Radiology CT ABDOMEN PELVIS W CONTRAST Result Date: 06/10/2023 CLINICAL DATA:  Non-Hodgkin's lymphoma.  Abdominal pain. * Tracking Code: BO * EXAM: CT ABDOMEN AND PELVIS WITH CONTRAST TECHNIQUE: Multidetector CT imaging of the abdomen and pelvis was performed using the standard protocol following bolus administration of intravenous contrast.  RADIATION DOSE REDUCTION: This exam was performed according to the departmental dose-optimization program which includes automated exposure control, adjustment of the mA and/or kV according to patient size and/or use of iterative reconstruction technique. CONTRAST:  OMNIPAQUE IOHEXOL 300 MG/ML  SOLN COMPARISON:  02/02/2023 FINDINGS: Lower chest: Bilateral breast implants. Stable scarring in both lower lobes and in the right middle lobe. Hepatobiliary: Stable 0.6 by 0.3 cm nonspecific lesion in segment 3 of the liver on image 21 series 2. No further imaging workup of this lesion is indicated. Gallbladder unremarkable. Pancreas: Unremarkable Spleen: Unremarkable Adrenals/Urinary Tract: Unremarkable Stomach/Bowel: Dilated loops of bowel with scattered air-fluid levels extending to a transition in the right abdomen shown  on image 35 series 3 where there is suspected wall thickening in a bowel loop at least over a 4.5 cm segment, subsequent mildly angulated turning of the loop, and possible mild accentuated mucosal enhancement in several loops of small bowel distal to this process. Appearance concerning for early or partial small bowel obstruction associated with inflamed or thick-walled loops of small bowel. No pneumatosis although there is some scattered ascites along the involved loops of small bowel. No extraluminal gas. No portal venous gas. Prominence of stool in the distal sigmoid colon and rectum, with some potential wall thickening and accentuated mucosal enhancement in the proximal sigmoid colon for example on image 35 series 3. Vascular/Lymphatic: There is only minimal aortic atherosclerosis. Pathologic mesenteric adenopathy is observed, index node 1.5 cm in short axis on image 41 series 2, stable. Reproductive: Unremarkable Other: Mild ascites along the dilated loops of small bowel. Musculoskeletal: Unremarkable IMPRESSION: 1. Dilated loops of small bowel with scattered air-fluid levels extending to a  transition in the right abdomen where there is suspected wall thickening in a bowel loop at least over a 4.5 cm segment, subsequent mildly angulated turning of the loop, and possible mild accentuated mucosal enhancement in several loops of small bowel distal to this process. Appearance concerning for early or partial small bowel obstruction associated with inflamed or thick-walled loops of small bowel. No pneumatosis although there is some scattered ascites along the involved loops of small bowel. No extraluminal gas. No portal venous gas. 2. Prominence of stool in the distal sigmoid colon and rectum, with some potential wall thickening and accentuated mucosal enhancement in the proximal sigmoid colon. The potential for multifocal inflammation including the involved small bowel segments and proximal sigmoid colon raise the possibility of Crohn's disease. 3. Stable pathologic mesenteric adenopathy, compatible with patient's history of lymphoma. 4. Stable scarring in both lower lobes and in the right middle lobe. 5. Stable 0.6 by 0.3 cm nonspecific lesion in segment 3 of the liver. No further imaging workup of this lesion is indicated. 6. Minimal aortic atherosclerosis. Electronically Signed   By: Gaylyn Rong M.D.   On: 06/10/2023 16:44   DG Chest Port 1 View Result Date: 06/10/2023 CLINICAL DATA:  Upper abdominal pain, non-Hodgkin's lymphoma EXAM: PORTABLE CHEST 1 VIEW COMPARISON:  Overlapping portions CT abdomen 02/02/2023 FINDINGS: Scarring in both lung bases. Cardiac and mediastinal margins appear normal. No blunting of the costophrenic angles. No significant osseous abnormalities observed. IMPRESSION: 1. Scarring in both lung bases. No acute findings. Electronically Signed   By: Gaylyn Rong M.D.   On: 06/10/2023 16:33    Procedures Procedures    Medications Ordered in ED Medications  sodium chloride 0.9 % bolus 1,000 mL (0 mLs Intravenous Stopped 06/10/23 1615)  ondansetron (ZOFRAN)  injection 4 mg (4 mg Intravenous Given 06/10/23 1429)  morphine (PF) 4 MG/ML injection 4 mg (4 mg Intravenous Given 06/10/23 1429)  iohexol (OMNIPAQUE) 300 MG/ML solution 100 mL (100 mLs Intravenous Contrast Given 06/10/23 1515)  morphine (PF) 4 MG/ML injection 4 mg (4 mg Intravenous Given 06/10/23 1638)    ED Course/ Medical Decision Making/ A&P                                 Medical Decision Making Amount and/or Complexity of Data Reviewed Labs: ordered. Radiology: ordered.  Risk Prescription drug management.   This patient complains of back pain now into abdominal pain with nausea; this  involves an extensive number of treatment Options and is a complaint that carries with it a high risk of complications and morbidity. The differential includes diverticulitis, colitis, renal colic, pyelonephritis, obstruction  I ordered, reviewed and interpreted labs, which included CBC with normal white count, chemistries and LFTs unremarkable, urinalysis without signs of infection, troponins flat I ordered medication IV fluids nausea medication pain medication and reviewed PMP when indicated. I ordered imaging studies which included chest x-ray and CT abdomen and pelvis and these are pending at time of signout Additional history obtained from patient's husband Previous records obtained and reviewed In epic including recent oncology notes Cardiac monitoring reviewed, normal sinus rhythm Social determinants considered, physically inactive Critical Interventions: None  After the interventions stated above, I reevaluated the patient and found patient to be well-appearing and hemodynamically stable Admission and further testing considered, her care is signed out to Dr. Jearld Fenton to follow-up on results of CT and response to pain medication.  Disposition based on patient's response to treatment and if any significant findings on CT.         Final Clinical Impression(s) / ED Diagnoses Final  diagnoses:  Generalized abdominal pain    Rx / DC Orders ED Discharge Orders     None         Terrilee Files, MD 06/10/23 1656

## 2023-06-10 NOTE — ED Triage Notes (Signed)
Bib by EMS from home for abdominal pain. Went to [facility] yesterday for kidney infection and prescribed antibiotics. Took today and said she didn't feel well. VS WNL, Hx of Non-Hodkin's Lymphoma in left abdomen area. Takes cholesterol meds daily.

## 2023-06-10 NOTE — ED Notes (Signed)
Patient needed NGT removed. Patient stated she could not tolerate it

## 2023-06-11 ENCOUNTER — Inpatient Hospital Stay (HOSPITAL_COMMUNITY): Payer: Medicare HMO

## 2023-06-11 DIAGNOSIS — K5909 Other constipation: Secondary | ICD-10-CM | POA: Diagnosis not present

## 2023-06-11 DIAGNOSIS — F32A Depression, unspecified: Secondary | ICD-10-CM

## 2023-06-11 DIAGNOSIS — R933 Abnormal findings on diagnostic imaging of other parts of digestive tract: Secondary | ICD-10-CM

## 2023-06-11 DIAGNOSIS — F419 Anxiety disorder, unspecified: Secondary | ICD-10-CM

## 2023-06-11 DIAGNOSIS — Z01810 Encounter for preprocedural cardiovascular examination: Secondary | ICD-10-CM | POA: Diagnosis not present

## 2023-06-11 DIAGNOSIS — K5669 Other partial intestinal obstruction: Secondary | ICD-10-CM

## 2023-06-11 DIAGNOSIS — Z8572 Personal history of non-Hodgkin lymphomas: Secondary | ICD-10-CM

## 2023-06-11 DIAGNOSIS — C8580 Other specified types of non-Hodgkin lymphoma, unspecified site: Secondary | ICD-10-CM | POA: Diagnosis not present

## 2023-06-11 DIAGNOSIS — R935 Abnormal findings on diagnostic imaging of other abdominal regions, including retroperitoneum: Secondary | ICD-10-CM

## 2023-06-11 DIAGNOSIS — K56609 Unspecified intestinal obstruction, unspecified as to partial versus complete obstruction: Principal | ICD-10-CM

## 2023-06-11 DIAGNOSIS — I10 Essential (primary) hypertension: Secondary | ICD-10-CM

## 2023-06-11 DIAGNOSIS — I2510 Atherosclerotic heart disease of native coronary artery without angina pectoris: Secondary | ICD-10-CM | POA: Diagnosis not present

## 2023-06-11 DIAGNOSIS — E785 Hyperlipidemia, unspecified: Secondary | ICD-10-CM

## 2023-06-11 DIAGNOSIS — K5904 Chronic idiopathic constipation: Secondary | ICD-10-CM

## 2023-06-11 LAB — ECHOCARDIOGRAM COMPLETE
AR max vel: 1.79 cm2
AV Area VTI: 2.02 cm2
AV Area mean vel: 1.83 cm2
AV Mean grad: 3 mm[Hg]
AV Peak grad: 6.4 mm[Hg]
Ao pk vel: 1.26 m/s
Area-P 1/2: 4.46 cm2
Calc EF: 62.8 %
Height: 64 in
MV VTI: 2.77 cm2
S' Lateral: 2.5 cm
Single Plane A2C EF: 67.8 %
Single Plane A4C EF: 57.3 %
Weight: 2304 [oz_av]

## 2023-06-11 LAB — CBC
HCT: 39.5 % (ref 36.0–46.0)
Hemoglobin: 12.7 g/dL (ref 12.0–15.0)
MCH: 32.4 pg (ref 26.0–34.0)
MCHC: 32.2 g/dL (ref 30.0–36.0)
MCV: 100.8 fL — ABNORMAL HIGH (ref 80.0–100.0)
Platelets: 158 10*3/uL (ref 150–400)
RBC: 3.92 MIL/uL (ref 3.87–5.11)
RDW: 12.8 % (ref 11.5–15.5)
WBC: 8.9 10*3/uL (ref 4.0–10.5)
nRBC: 0 % (ref 0.0–0.2)

## 2023-06-11 LAB — BASIC METABOLIC PANEL
Anion gap: 8 (ref 5–15)
BUN: 18 mg/dL (ref 8–23)
CO2: 25 mmol/L (ref 22–32)
Calcium: 8.2 mg/dL — ABNORMAL LOW (ref 8.9–10.3)
Chloride: 105 mmol/L (ref 98–111)
Creatinine, Ser: 0.72 mg/dL (ref 0.44–1.00)
GFR, Estimated: >60 mL/min (ref >60)
Glucose, Bld: 113 mg/dL — ABNORMAL HIGH (ref 70–99)
Potassium: 4.4 mmol/L (ref 3.5–5.1)
Sodium: 138 mmol/L (ref 135–145)

## 2023-06-11 LAB — HIV ANTIBODY (ROUTINE TESTING W REFLEX): HIV Screen 4th Generation wRfx: NONREACTIVE

## 2023-06-11 MED ORDER — ASPIRIN 81 MG PO TBEC
81.0000 mg | DELAYED_RELEASE_TABLET | Freq: Every day | ORAL | Status: DC
  Administered 2023-06-11 – 2023-06-13 (×3): 81 mg via ORAL
  Filled 2023-06-11 (×3): qty 1

## 2023-06-11 MED ORDER — PAROXETINE HCL 20 MG PO TABS
40.0000 mg | ORAL_TABLET | Freq: Every day | ORAL | Status: DC
  Administered 2023-06-11: 40 mg via ORAL
  Filled 2023-06-11 (×2): qty 2

## 2023-06-11 MED ORDER — PAROXETINE HCL 20 MG PO TABS
40.0000 mg | ORAL_TABLET | Freq: Every day | ORAL | Status: DC
  Administered 2023-06-12 – 2023-06-13 (×2): 40 mg via ORAL
  Filled 2023-06-11 (×2): qty 2

## 2023-06-11 MED ORDER — ENOXAPARIN SODIUM 40 MG/0.4ML IJ SOSY
40.0000 mg | PREFILLED_SYRINGE | Freq: Every day | INTRAMUSCULAR | Status: DC
  Administered 2023-06-11 – 2023-06-12 (×2): 40 mg via SUBCUTANEOUS
  Filled 2023-06-11 (×2): qty 0.4

## 2023-06-11 MED ORDER — METOPROLOL SUCCINATE ER 25 MG PO TB24
12.5000 mg | ORAL_TABLET | Freq: Every day | ORAL | Status: DC
  Administered 2023-06-11 – 2023-06-13 (×2): 12.5 mg via ORAL
  Filled 2023-06-11 (×2): qty 1

## 2023-06-11 MED ORDER — LAMOTRIGINE 100 MG PO TABS
200.0000 mg | ORAL_TABLET | Freq: Every day | ORAL | Status: DC
  Administered 2023-06-11 – 2023-06-13 (×3): 200 mg via ORAL
  Filled 2023-06-11 (×4): qty 2

## 2023-06-11 MED ORDER — ATORVASTATIN CALCIUM 40 MG PO TABS
80.0000 mg | ORAL_TABLET | Freq: Every day | ORAL | Status: DC
  Administered 2023-06-11 – 2023-06-13 (×3): 80 mg via ORAL
  Filled 2023-06-11 (×3): qty 2

## 2023-06-11 MED ORDER — EZETIMIBE 10 MG PO TABS
10.0000 mg | ORAL_TABLET | Freq: Every day | ORAL | Status: DC
  Administered 2023-06-11 – 2023-06-13 (×3): 10 mg via ORAL
  Filled 2023-06-11 (×3): qty 1

## 2023-06-11 NOTE — Assessment & Plan Note (Signed)
 Resume paxil and lamictal per her home regimen.

## 2023-06-11 NOTE — Progress Notes (Signed)
  Echocardiogram 2D Echocardiogram has been performed.  Ocie Doyne RDCS 06/11/2023, 12:49 PM

## 2023-06-11 NOTE — Assessment & Plan Note (Signed)
 Today with no nausea or vomiting.   Plan to continue supportive medical therapy.  Started with clear liquids IV fluids and as needed analgesics and antiemetics.  Pantoprazole.

## 2023-06-11 NOTE — Consult Note (Signed)
 Reason for Consult: Partial small bowel obstruction Referring Physician: Dr. Leavy Cella is an 63 y.o. female.  HPI: Patient is a 63 year old white female with a history of diffuse non-Hodgkin's lymphoma who states she had an episode of nonspecific abdominal pain last year and had a CT scan done in October 2024 which showed a short segment of small bowel thickening.  Patient was noted to have mild mesenteric lymphadenopathy but no new or progressing lymphadenopathy.  This seemed to resolve without hospitalization.  Her abdominal and back pain returned yesterday with some nausea which was worse than her previous episode.  She denied any vomiting.  She is passing gas.  Her last bowel movement was 24 hours ago.  A repeat CT scan of the abdomen pelvis revealed multiple small bowel dilated loops with some transitioning in the right lower quadrant with a suspected area of bowel wall thickening.  No pneumatosis was seen.  This appeared to be an early small bowel obstruction.  There was also some possible bowel wall thickening in the proximal sigmoid colon.  Stable mesenteric adenopathy was noted.  Patient was admitted in the hospital for further evaluation and treatment.  She states her abdominal pain has decreased while in the hospital.  She is passing gas.  Past Medical History:  Diagnosis Date   Anxiety and depression    CAD (coronary artery disease)    MI + stent 2018   CHF (congestive heart failure) (HCC) 01/12/2023   ef 25% to 30%   Colon cancer screening 04/2019   Cologuard NEG.  Repeat 04/2022   Diffuse large B cell lymphoma (HCC) 2018   NHL-L peritoneal mesenteric mass-->no evidence of aggressive lymphoma as of 02/2021 hem/onc f/u   Dyspepsia    GERD (gastroesophageal reflux disease)    History of myocardial infarction 2018   Hx of migraines    remote past   Hypercholesterolemia    NHL (non-Hodgkin's lymphoma) (HCC) 2016   left peritoneal mesonteric mass   Palpitations 2015   no  monitoring was done-->resolved spontaneously    Past Surgical History:  Procedure Laterality Date   biopsy  2016   Initial bx needle; then lab abd (done in [facility]) done to get further bx, exp lap--complications--sepsis/hemorrhage--memory/cognitive difficulties since that surgery/hospitalization--details somewhat cloudy from pt's info.   BREAST ENHANCEMENT SURGERY  2003   Heart Stent  2018   Prox LAD (murrells inlet).   INCISIONAL HERNIA REPAIR  2017   w/mesh   TONSILLECTOMY     TRANSTHORACIC ECHOCARDIOGRAM  09/2013   2015 ALL NORMAL.  2020 normal per pt report (Drr. Adella Hare with [facility] [place]   WISDOM TOOTH EXTRACTION      Family History  Problem Relation Age of Onset   Heart disease Mother    Emphysema Mother    Hypertension Mother    Stroke Mother    Depression Mother    Heart failure Mother    Hypertension Brother    Depression Brother    Stroke Maternal Aunt    Depression Maternal Aunt    CAD Maternal Aunt    Colon cancer Other     Social History:  reports that she has never smoked. She has never used smokeless tobacco. She reports that she does not drink alcohol and does not use drugs.  Allergies:  Allergies  Allergen Reactions   Cephalosporins Anaphylaxis   Rocephin [Ceftriaxone] Anaphylaxis, Hives and Shortness Of Breath    Facial numbness   Sulfa Antibiotics Itching and Rash  Medications: I have reviewed the patient's current medications. Prior to Admission:  Medications Prior to Admission  Medication Sig Dispense Refill Last Dose/Taking   ALPRAZolam (XANAX) 0.5 MG tablet Take 1 tablet (0.5 mg total) by mouth 3 (three) times daily. (Patient taking differently: Take 0.5 mg by mouth at bedtime as needed for sleep.) 90 tablet 5 06/09/2023 Bedtime   aspirin EC 81 MG tablet Take 81 mg by mouth daily.   06/10/2023 at  1:00 PM   atorvastatin (LIPITOR) 80 MG tablet TAKE 1 TABLET EVERY DAY 30 tablet 0 06/09/2023 Noon   ciprofloxacin (CIPRO) 250 MG tablet Take 250 mg  by mouth 2 (two) times daily.   06/10/2023 Morning   ezetimibe (ZETIA) 10 MG tablet Take 1 tablet (10 mg total) by mouth daily. 90 tablet 3 06/09/2023 Noon   lamoTRIgine (LAMICTAL) 200 MG tablet Take 200 mg by mouth daily.   06/10/2023 Morning   Magnesium Hydroxide (MILK OF MAGNESIA PO) Take 15 mLs by mouth every other day.   06/08/2023   metoprolol succinate (TOPROL-XL) 25 MG 24 hr tablet TAKE 1/2 TABLET ONE TIME DAILY 45 tablet 3 06/09/2023 Noon   Multiple Vitamins-Minerals (MULTIVITAMIN ADULT PO) Take by mouth daily.   06/07/2023   naproxen sodium (ALEVE) 220 MG tablet Take 220 mg by mouth daily as needed (back pain).   06/08/2023   nitroGLYCERIN (NITROSTAT) 0.4 MG SL tablet Place 0.4 mg under the tongue every 5 (five) minutes as needed for chest pain.   Taking As Needed   PARoxetine (PAXIL) 40 MG tablet Take 1 tablet (40 mg total) by mouth daily. 30 tablet 0 06/10/2023 Morning    Results for orders placed or performed during the hospital encounter of 06/10/23 (from the past 48 hours)  Urinalysis, w/ Reflex to Culture (Infection Suspected) -Urine, Clean Catch     Status: Abnormal   Collection Time: 06/10/23  2:19 PM  Result Value Ref Range   Specimen Source URINE, CLEAN CATCH    Color, Urine YELLOW YELLOW   APPearance HAZY (A) CLEAR   Specific Gravity, Urine 1.026 1.005 - 1.030   pH 8.0 5.0 - 8.0   Glucose, UA NEGATIVE NEGATIVE mg/dL   Hgb urine dipstick NEGATIVE NEGATIVE   Bilirubin Urine NEGATIVE NEGATIVE   Ketones, ur NEGATIVE NEGATIVE mg/dL   Protein, ur NEGATIVE NEGATIVE mg/dL   Nitrite NEGATIVE NEGATIVE   Leukocytes,Ua NEGATIVE NEGATIVE   RBC / HPF 0-5 0 - 5 RBC/hpf   WBC, UA 0-5 0 - 5 WBC/hpf    Comment:        Reflex urine culture not performed if WBC <=10, OR if Squamous epithelial cells >5. If Squamous epithelial cells >5 suggest recollection.    Bacteria, UA NONE SEEN NONE SEEN   Squamous Epithelial / HPF 0-5 0 - 5 /HPF   Mucus PRESENT    Amorphous Crystal PRESENT      Comment: Performed at [facility], [address] [place], [place] [address]  Comprehensive metabolic panel     Status: Abnormal   Collection Time: 06/10/23  2:27 PM  Result Value Ref Range   Sodium 137 135 - 145 mmol/L   Potassium 4.2 3.5 - 5.1 mmol/L   Chloride 101 98 - 111 mmol/L   CO2 23 22 - 32 mmol/L   Glucose, Bld 128 (H) 70 - 99 mg/dL    Comment: Glucose reference range applies only to samples taken after fasting for at least 8 hours.   BUN 16 8 - 23  mg/dL   Creatinine, Ser 1.61 0.44 - 1.00 mg/dL   Calcium 9.6 8.9 - 09.6 mg/dL   Total Protein 7.3 6.5 - 8.1 g/dL   Albumin 4.8 3.5 - 5.0 g/dL   AST 37 15 - 41 U/L   ALT 33 0 - 44 U/L   Alkaline Phosphatase 124 38 - 126 U/L   Total Bilirubin 1.1 0.0 - 1.2 mg/dL   GFR, Estimated >04 >54 mL/min    Comment: (NOTE) Calculated using the CKD-EPI Creatinine Equation (2021)    Anion gap 13 5 - 15    Comment: Performed at [facility], [address], [place], [place] [address]  Lipase, blood     Status: None   Collection Time: 06/10/23  2:27 PM  Result Value Ref Range   Lipase 27 11 - 51 U/L    Comment: Performed at [facility], [address], [place], [place] [address]  CBC with Differential     Status: Abnormal   Collection Time: 06/10/23  2:27 PM  Result Value Ref Range   WBC 10.1 4.0 - 10.5 K/uL   RBC 4.82 3.87 - 5.11 MIL/uL   Hemoglobin 16.0 (H) 12.0 - 15.0 g/dL   HCT 29.5 (H) 62.1 - 30.8 %   MCV 96.3 80.0 - 100.0 fL   MCH 33.2 26.0 - 34.0 pg   MCHC 34.5 30.0 - 36.0 g/dL   RDW 65.7 84.6 - 96.2 %   Platelets 211 150 - 400 K/uL   nRBC 0.0 0.0 - 0.2 %   Neutrophils Relative % 84 %   Neutro Abs 8.5 (H) 1.7 - 7.7 K/uL   Lymphocytes Relative 10 %   Lymphs Abs 1.0 0.7 - 4.0 K/uL   Monocytes Relative 5 %   Monocytes Absolute 0.5 0.1 - 1.0 K/uL   Eosinophils Relative 1 %   Eosinophils Absolute 0.1 0.0 - 0.5 K/uL   Basophils Relative 0 %   Basophils Absolute 0.0 0.0 - 0.1 K/uL   Immature Granulocytes 0 %   Abs Immature  Granulocytes 0.03 0.00 - 0.07 K/uL    Comment: Performed at [facility], [address] [place], [place] [address]  Troponin I (High Sensitivity)     Status: None   Collection Time: 06/10/23  2:27 PM  Result Value Ref Range   Troponin I (High Sensitivity) <2 <18 ng/L    Comment: (NOTE) Elevated high sensitivity troponin I (hsTnI) values and significant  changes across serial measurements may suggest ACS but many other  chronic and acute conditions are known to elevate hsTnI results.  Refer to the "Links" section for chest pain algorithms and additional  guidance. Performed at [facility], [address], [place], [place] [address]   Lactic acid, plasma     Status: None   Collection Time: 06/10/23  2:27 PM  Result Value Ref Range   Lactic Acid, Venous 1.6 0.5 - 1.9 mmol/L    Comment: Performed at [facility], [address], [place], [place] [address]  Troponin I (High Sensitivity)     Status: None   Collection Time: 06/10/23  4:11 PM  Result Value Ref Range   Troponin I (High Sensitivity) <2 <18 ng/L    Comment: (NOTE) Elevated high sensitivity troponin I (hsTnI) values and significant  changes across serial measurements may suggest ACS but many other  chronic and acute conditions are known to elevate hsTnI results.  Refer to the "Links" section for chest pain algorithms and additional  guidance. Performed at [facility], [address] [place], [place] [address]   Lactic acid, plasma     Status: None   Collection Time: 06/10/23  5:27 PM  Result Value Ref Range   Lactic Acid, Venous 1.5 0.5 - 1.9 mmol/L    Comment: Performed at [facility], [address], [place], [place] [address]  Lactic acid, plasma     Status: None   Collection Time: 06/10/23  8:41 PM  Result Value Ref Range   Lactic Acid, Venous 0.8 0.5 - 1.9 mmol/L    Comment: Performed at [facility], [address], [place], [place] [address]  Basic metabolic panel     Status: Abnormal   Collection Time: 06/11/23   5:51 AM  Result Value Ref Range   Sodium 138 135 - 145 mmol/L   Potassium 4.4 3.5 - 5.1 mmol/L   Chloride 105 98 - 111 mmol/L   CO2 25 22 - 32 mmol/L   Glucose, Bld 113 (H) 70 - 99 mg/dL    Comment: Glucose reference range applies only to samples taken after fasting for at least 8 hours.   BUN 18 8 - 23 mg/dL   Creatinine, Ser 6.96 0.44 - 1.00 mg/dL   Calcium 8.2 (L) 8.9 - 10.3 mg/dL   GFR, Estimated >29 >52 mL/min    Comment: (NOTE) Calculated using the CKD-EPI Creatinine Equation (2021)    Anion gap 8 5 - 15    Comment: Performed at [facility], [address] [place], [place] [address]  CBC     Status: Abnormal   Collection Time: 06/11/23  5:51 AM  Result Value Ref Range   WBC 8.9 4.0 - 10.5 K/uL   RBC 3.92 3.87 - 5.11 MIL/uL   Hemoglobin 12.7 12.0 - 15.0 g/dL   HCT 44.0 10.2 - 72.5 %   MCV 100.8 (H) 80.0 - 100.0 fL   MCH 32.4 26.0 - 34.0 pg   MCHC 32.2 30.0 - 36.0 g/dL   RDW 36.6 44.0 - 34.7 %   Platelets 158 150 - 400 K/uL   nRBC 0.0 0.0 - 0.2 %    Comment: Performed at [facility], [address] [place], [place] [address]    CT ABDOMEN PELVIS W CONTRAST Result Date: 06/10/2023 CLINICAL DATA:  Non-Hodgkin's lymphoma.  Abdominal pain. * Tracking Code: BO * EXAM: CT ABDOMEN AND PELVIS WITH CONTRAST TECHNIQUE: Multidetector CT imaging of the abdomen and pelvis was performed using the standard protocol following bolus administration of intravenous contrast. RADIATION DOSE REDUCTION: This exam was performed according to the departmental dose-optimization program which includes automated exposure control, adjustment of the mA and/or kV according to patient size and/or use of iterative reconstruction technique. CONTRAST:  OMNIPAQUE IOHEXOL 300 MG/ML  SOLN COMPARISON:  02/02/2023 FINDINGS: Lower chest: Bilateral breast implants. Stable scarring in both lower lobes and in the right middle lobe. Hepatobiliary: Stable 0.6 by 0.3 cm nonspecific lesion in segment 3 of the liver on  image 21 series 2. No further imaging workup of this lesion is indicated. Gallbladder unremarkable. Pancreas: Unremarkable Spleen: Unremarkable Adrenals/Urinary Tract: Unremarkable Stomach/Bowel: Dilated loops of bowel with scattered air-fluid levels extending to a transition in the right abdomen shown on image 35 series 3 where there is suspected wall thickening in a bowel loop at least over a 4.5 cm segment, subsequent mildly angulated turning of the loop, and possible mild accentuated mucosal enhancement in several loops of small bowel distal to this process. Appearance concerning for early or partial small  bowel obstruction associated with inflamed or thick-walled loops of small bowel. No pneumatosis although there is some scattered ascites along the involved loops of small bowel. No extraluminal gas. No portal venous gas. Prominence of stool in the distal sigmoid colon and rectum, with some potential wall thickening and accentuated mucosal enhancement in the proximal sigmoid colon for example on image 35 series 3. Vascular/Lymphatic: There is only minimal aortic atherosclerosis. Pathologic mesenteric adenopathy is observed, index node 1.5 cm in short axis on image 41 series 2, stable. Reproductive: Unremarkable Other: Mild ascites along the dilated loops of small bowel. Musculoskeletal: Unremarkable IMPRESSION: 1. Dilated loops of small bowel with scattered air-fluid levels extending to a transition in the right abdomen where there is suspected wall thickening in a bowel loop at least over a 4.5 cm segment, subsequent mildly angulated turning of the loop, and possible mild accentuated mucosal enhancement in several loops of small bowel distal to this process. Appearance concerning for early or partial small bowel obstruction associated with inflamed or thick-walled loops of small bowel. No pneumatosis although there is some scattered ascites along the involved loops of small bowel. No extraluminal gas. No  portal venous gas. 2. Prominence of stool in the distal sigmoid colon and rectum, with some potential wall thickening and accentuated mucosal enhancement in the proximal sigmoid colon. The potential for multifocal inflammation including the involved small bowel segments and proximal sigmoid colon raise the possibility of Crohn's disease. 3. Stable pathologic mesenteric adenopathy, compatible with patient's history of lymphoma. 4. Stable scarring in both lower lobes and in the right middle lobe. 5. Stable 0.6 by 0.3 cm nonspecific lesion in segment 3 of the liver. No further imaging workup of this lesion is indicated. 6. Minimal aortic atherosclerosis. Electronically Signed   By: Gaylyn Rong M.D.   On: 06/10/2023 16:44   DG Chest Port 1 View Result Date: 06/10/2023 CLINICAL DATA:  Upper abdominal pain, non-Hodgkin's lymphoma EXAM: PORTABLE CHEST 1 VIEW COMPARISON:  Overlapping portions CT abdomen 02/02/2023 FINDINGS: Scarring in both lung bases. Cardiac and mediastinal margins appear normal. No blunting of the costophrenic angles. No significant osseous abnormalities observed. IMPRESSION: 1. Scarring in both lung bases. No acute findings. Electronically Signed   By: Gaylyn Rong M.D.   On: 06/10/2023 16:33    ROS:  Pertinent items are noted in HPI.  Blood pressure 113/71, pulse 79, temperature 98.6 F (37 C), temperature source Oral, resp. rate 16, height 5\' 4"  (1.626 m), weight 65.3 kg, last menstrual period 10/20/2011, SpO2 100%. Physical Exam: Pleasant white female who appears fatigued. Head is normocephalic, atraumatic Lungs clear to auscultation with a breath sounds bilaterally Heart examination reveals a regular rate and rhythm without S3, S4, murmurs Abdomen soft with no specific point tenderness.  No rigidity is noted.  It does not appear to be significantly distended.  CT scan images personally reviewed  Assessment/Plan: Impression: Partial small bowel obstruction with  possible enteritis, history of non-Hodgkin's lymphoma with mesenteric adenopathy noted.  No need for acute surgical intervention at the present time.  Patient has had exploratory laparotomy in the past and this could be a result of adhesive disease. Plan: NG tube placement was attempted.  At this point, she does not need an NG tube.  Will start sips of clears.  Will continue bowel rest and supportive care as able.  Will follow with you.  Franky Macho 06/11/2023, 10:38 AM

## 2023-06-11 NOTE — Consult Note (Signed)
 Vista Lawman, M.D. Gastroenterology & Hepatology                                           Patient Name: Gabrielle [name] MRN: [id number] Admission Date: 06/10/2023 Date of Evaluation:  06/11/2023 Time of Evaluation: 4:59 PM  Chief Complaint:   partial SBO, small intestine, and sigmoid colon thickening, history of lymphoma    HPI:  This is a 63 y.o. female with history of lymphoma and chronic constipation presented with abdominal pain and nausea.  Patient is admitted with partial small bowel obstruction.  GI is consulted for partial SBO, small intestine and sigmoid colon thickening with history of lymphoma.  Patient currently reports some nausea but denies any vomiting.  She passed gas this morning but have not had bowel movement in 1 to 2 days. Patient was supposed to have colonoscopy October 2024 which was canceled and since then having had one.Patient has history of constipation where she would have 1-2 bowel movements per week.  Patient had history of lymphoma where she had a biopsy and subsequent intestinal perforation in 2016 requiring surgical intervention.  Labs with creatinine 0.72 hemoglobin 12.7 MCV 100 platelet 158 WBC 8.9  CT abdomen pelvis with IV contrast demonstrated dilated loops of small bowel with scattered air-fluid level concerning for partial SBO.  There was sigmoid colon rectal wall thickening.  There is pathological mesenteric adenopathy  Past Medical History: SEE CHRONIC ISSSUES: Past Medical History:  Diagnosis Date   Anxiety and depression    CAD (coronary artery disease)    MI + stent 2018   CHF (congestive heart failure) (HCC) 01/12/2023   ef 25% to 30%   Colon cancer screening 04/2019   Cologuard NEG.  Repeat 04/2022   Diffuse large B cell lymphoma (HCC) 2018   NHL-L peritoneal mesenteric mass-->no evidence of aggressive lymphoma as of 02/2021 hem/onc f/u   Dyspepsia    GERD (gastroesophageal reflux disease)    History of myocardial infarction 2018    Hx of migraines    remote past   Hypercholesterolemia    NHL (non-Hodgkin's lymphoma) (HCC) 2016   left peritoneal mesonteric mass   Palpitations 2015   no monitoring was done-->resolved spontaneously   Past Surgical History:  Past Surgical History:  Procedure Laterality Date   biopsy  2016   Initial bx needle; then lab abd (done in [facility]) done to get further bx, exp lap--complications--sepsis/hemorrhage--memory/cognitive difficulties since that surgery/hospitalization--details somewhat cloudy from pt's info.   BREAST ENHANCEMENT SURGERY  2003   Heart Stent  2018   Prox LAD (murrells inlet).   INCISIONAL HERNIA REPAIR  2017   w/mesh   TONSILLECTOMY     TRANSTHORACIC ECHOCARDIOGRAM  09/2013   2015 ALL NORMAL.  2020 normal per pt report (Drr. Adella Hare with [facility] [place]   WISDOM TOOTH EXTRACTION     Family History:  Family History  Problem Relation Age of Onset   Heart disease Mother    Emphysema Mother    Hypertension Mother    Stroke Mother    Depression Mother    Heart failure Mother    Hypertension Brother    Depression Brother    Stroke Maternal Aunt    Depression Maternal Aunt    CAD Maternal Aunt    Colon cancer Other    Social History:  Social History   Tobacco Use  Smoking status: Never   Smokeless tobacco: Never  Vaping Use   Vaping status: Never Used  Substance Use Topics   Alcohol use: No   Drug use: No    Home Medications:  Prior to Admission medications   Medication Sig Start Date End Date Taking? Authorizing Provider  ALPRAZolam Prudy Feeler) 0.5 MG tablet Take 1 tablet (0.5 mg total) by mouth 3 (three) times daily. Patient taking differently: Take 0.5 mg by mouth at bedtime as needed for sleep. 06/19/21  Yes McGowen, Maryjean Morn, MD  aspirin EC 81 MG tablet Take 81 mg by mouth daily. 03/12/19  Yes [provider]  atorvastatin (LIPITOR) 80 MG tablet TAKE 1 TABLET EVERY DAY 12/02/21  Yes McGowen, Maryjean Morn, MD  ciprofloxacin (CIPRO) 250 MG tablet  Take 250 mg by mouth 2 (two) times daily. 06/10/23  Yes [provider]  ezetimibe (ZETIA) 10 MG tablet Take 1 tablet (10 mg total) by mouth daily. 06/19/21  Yes McGowen, Maryjean Morn, MD  lamoTRIgine (LAMICTAL) 200 MG tablet Take 200 mg by mouth daily. 03/12/19  Yes [provider]  Magnesium Hydroxide (MILK OF MAGNESIA PO) Take 15 mLs by mouth every other day.   Yes [provider]  metoprolol succinate (TOPROL-XL) 25 MG 24 hr tablet TAKE 1/2 TABLET ONE TIME DAILY 06/19/21  Yes McGowen, Maryjean Morn, MD  Multiple Vitamins-Minerals (MULTIVITAMIN ADULT PO) Take by mouth daily.   Yes [provider]  naproxen sodium (ALEVE) 220 MG tablet Take 220 mg by mouth daily as needed (back pain).   Yes [provider]  nitroGLYCERIN (NITROSTAT) 0.4 MG SL tablet Place 0.4 mg under the tongue every 5 (five) minutes as needed for chest pain. 05/25/21  Yes [provider]  PARoxetine (PAXIL) 40 MG tablet Take 1 tablet (40 mg total) by mouth daily. 12/04/21  Yes McGowen, Maryjean Morn, MD    Inpatient Medications:  Current Facility-Administered Medications:    albuterol (PROVENTIL) (2.5 MG/3ML) 0.083% nebulizer solution 2.5 mg, 2.5 mg, Nebulization, Q2H PRN, Elgergawy, Leana Roe, MD   ALPRAZolam Prudy Feeler) tablet 0.5 mg, 0.5 mg, Oral, QHS PRN, Elgergawy, Leana Roe, MD   aspirin EC tablet 81 mg, 81 mg, Oral, Daily, Arrien, York Ram, MD, 81 mg at 06/11/23 1424   atorvastatin (LIPITOR) tablet 80 mg, 80 mg, Oral, Daily, Arrien, York Ram, MD, 80 mg at 06/11/23 1428   enoxaparin (LOVENOX) injection 40 mg, 40 mg, Subcutaneous, QHS, Arrien, York Ram, MD   ezetimibe (ZETIA) tablet 10 mg, 10 mg, Oral, Daily, Arrien, York Ram, MD, 10 mg at 06/11/23 1429   hydrALAZINE (APRESOLINE) injection 5 mg, 5 mg, Intravenous, Q4H PRN, Elgergawy, Leana Roe, MD   lactated ringers infusion, , Intravenous, Continuous, Elgergawy, Leana Roe, MD, Last Rate: 75 mL/hr at 06/11/23 0820,  New Bag at 06/11/23 0820   lamoTRIgine (LAMICTAL) tablet 200 mg, 200 mg, Oral, Daily, Arrien, York Ram, MD, 200 mg at 06/11/23 1425   metoprolol succinate (TOPROL-XL) 24 hr tablet 12.5 mg, 12.5 mg, Oral, Daily, Arrien, York Ram, MD, 12.5 mg at 06/11/23 1429   morphine (PF) 2 MG/ML injection 2 mg, 2 mg, Intravenous, Q3H PRN, Elgergawy, Leana Roe, MD, 2 mg at 06/11/23 1256   ondansetron (ZOFRAN) tablet 4 mg, 4 mg, Oral, Q6H PRN **OR** ondansetron (ZOFRAN) injection 4 mg, 4 mg, Intravenous, Q6H PRN, Elgergawy, Leana Roe, MD, 4 mg at 06/10/23 2212   PARoxetine (PAXIL) tablet 40 mg, 40 mg, Oral, Daily, Arrien, York Ram, MD Allergies: Cephalosporins, Rocephin [ceftriaxone],  and Sulfa antibiotics  Complete Review of Systems: GENERAL: negative for malaise, night sweats HEENT: No changes in hearing or vision, no nose bleeds or other nasal problems. NECK: Negative for lumps, goiter, pain and significant neck swelling RESPIRATORY: Negative for cough, wheezing CARDIOVASCULAR: Negative for chest pain, leg swelling, palpitations, orthopnea GI: SEE HPI MUSCULOSKELETAL: Negative for joint pain or swelling, back pain, and muscle pain. SKIN: Negative for lesions, rash PSYCH: Negative for sleep disturbance, mood disorder and recent psychosocial stressors. HEMATOLOGY Negative for prolonged bleeding, bruising easily, and swollen nodes. ENDOCRINE: Negative for cold or heat intolerance, polyuria, polydipsia and goiter. NEURO: negative for tremor, gait imbalance, syncope and seizures. The remainder of the review of systems is noncontributory.  Physical Exam: BP (!) 112/53 (BP Location: Left Arm)   Pulse 78   Temp 98.6 F (37 C) (Oral)   Resp 17   Ht 5\' 4"  (1.626 m)   Wt 65.3 kg   LMP 10/20/2011   SpO2 99%   BMI 24.72 kg/m  GENERAL: The patient is AO x3, in no acute distress. HEENT: Head is normocephalic and atraumatic. EOMI are intact. Mouth is well hydrated and without lesions. NECK:  Supple. No masses LUNGS: Clear to auscultation. No presence of rhonchi/wheezing/rales. Adequate chest expansion HEART: RRR, normal s1 and s2. ABDOMEN: Soft, nontender, no guarding, no peritoneal signs, and nondistended. BS +. No masses.  Laboratory Data CBC:     Component Value Date/Time   WBC 8.9 06/11/2023 0551   RBC 3.92 06/11/2023 0551   HGB 12.7 06/11/2023 0551   HGB 13.5 03/21/2023 0751   HCT 39.5 06/11/2023 0551   PLT 158 06/11/2023 0551   PLT 179 03/21/2023 0751   MCV 100.8 (H) 06/11/2023 0551   MCH 32.4 06/11/2023 0551   MCHC 32.2 06/11/2023 0551   RDW 12.8 06/11/2023 0551   LYMPHSABS 1.0 06/10/2023 1427   MONOABS 0.5 06/10/2023 1427   EOSABS 0.1 06/10/2023 1427   BASOSABS 0.0 06/10/2023 1427   COAG:  Lab Results  Component Value Date   INR 0.98 06/04/2014    BMP:     Latest Ref Rng & Units 06/11/2023    5:51 AM 06/10/2023    2:27 PM 03/21/2023    7:51 AM  BMP  Glucose 70 - 99 mg/dL 161  096  045   BUN 8 - 23 mg/dL 18  16  18    Creatinine 0.44 - 1.00 mg/dL 4.09  8.11  9.14   Sodium 135 - 145 mmol/L 138  137  141   Potassium 3.5 - 5.1 mmol/L 4.4  4.2  4.5   Chloride 98 - 111 mmol/L 105  101  106   CO2 22 - 32 mmol/L 25  23  30    Calcium 8.9 - 10.3 mg/dL 8.2  9.6  9.0     HEPATIC:     Latest Ref Rng & Units 06/10/2023    2:27 PM 03/21/2023    7:51 AM 02/02/2023   12:41 PM  Hepatic Function  Total Protein 6.5 - 8.1 g/dL 7.3  6.3  6.4   Albumin 3.5 - 5.0 g/dL 4.8  4.4  4.3   AST 15 - 41 U/L 37  25  31   ALT 0 - 44 U/L 33  25  30   Alk Phosphatase 38 - 126 U/L 124  104  101   Total Bilirubin 0.0 - 1.2 mg/dL 1.1  0.6  0.7     CARDIAC: No results found for: "CKTOTAL", "CKMB", "  CKMBINDEX", "TROPONINI"   Imaging: I personally reviewed and interpreted the available imaging.  Assessment & Plan:  This is a 63 y.o. female with history of lymphoma and chronic constipation presented with abdominal pain and nausea.  Patient is admitted with partial small bowel  obstruction.  GI is consulted for partial SBO, small intestine and sigmoid colon thickening with history of lymphoma.  #Abnormal CT #Partial SBO  Differential remains partial SBO due to previous adhesions due to history of ex lap  Given multiple small bowel segment involvement this could be Crohn's disease . Need to rule out malignancy  Fortunately patient does not has vomiting, is slightly nauseous only with benign abdominal exam  Patient was supposed to have colonoscopy with Dr. Stan Head at [facility] last year but was lost to follow-up. It is imperative for patient to have follow-up colonoscopy as outpatient and appointment with gastroenterologist upon discharge   Vista Lawman, MD Gastroenterology and Hepatology [facility] Gastroenterology  This chart has been completed using [facility] Dictation software, and while attempts have been made to ensure accuracy , certain words and phrases may not be transcribed as intended

## 2023-06-11 NOTE — Assessment & Plan Note (Signed)
 Follow up with Dr Jarrett Ables as outpatient.  Cell counts are consistent with no anemia, leukopenia or thrombocytopenia.

## 2023-06-11 NOTE — Assessment & Plan Note (Signed)
 Continue blood pressure control with metoprolol XL   Follow up echocardiogram with preserved LV systolic function with EF 55 to 60%, RV systolic function preserved, no significant valvular disease.

## 2023-06-11 NOTE — Progress Notes (Signed)
   06/11/23 1052  TOC Brief Assessment  Insurance and Status Reviewed  Patient has primary care physician Yes  Home environment has been reviewed from home  Prior level of function: independent  Prior/Current Home Services No current home services  Social Drivers of Health Review SDOH reviewed no interventions necessary  Readmission risk has been reviewed Yes  Transition of care needs no transition of care needs at this time     Transition of Care Department [facility]) has reviewed patient and no TOC needs have been identified at this time. We will continue to monitor patient advancement through interdisciplinary progression rounds. If new patient transition needs arise, please place a TOC consult.

## 2023-06-11 NOTE — Hospital Course (Signed)
 Gabrielle [name] was admitted to the hospital with the working diagnosis of small bowel obstruction.   63 yo female with the past medical history of coronary artery disease, diffuse large B cell lymphoma, GERD, and hyperlipidemia, who presented with abdominal pain.  Reported 24 hrs of abdominal and back pain, associated with nausea but not vomiting. Positive flatus and bowel movements.  On her initial physical examination her blood pressure was 132/74, HR 84, RR 16 and 02 saturation 95%. Lungs with no wheezing or rales, heart with S1 and S2 present and regular, abdomen soft, tender to palpation in the upper and left lower quadrant, no rebound or guarding, no lower extremity edema.   Na 137, K 4,2 Cl 101, bicarbonate 23 glucose 128 bun 16 cr 0,72 Lactic acid 1,6  Wbc 10,1 hgb 16,0 plt 211  Urine analysis SG 1,026, protein negative, negative leukocytes and negative hgb.   CT abdomen and pelvis with dilated loops of small bowel with scattered air fluid levels extending to a transition in the right abdomen where there is a suspected wall thickening in a bowel loop at least over a 4,5 cm segment. Prominent stoop in the distal segment and rectum.   Chest radiograph with hypoinflation, with no effusions or infiltrates. No cardiomegaly.  EKG 79 bpm, normal axis, normal intervals, sinus rhythm with no significant ST segment or T wave changes.   02/16 continue not resolved partial SBO, added milk of magnesia today. If no improving sings may need repeat CT abdomen and pelvis (with contrast).

## 2023-06-11 NOTE — Assessment & Plan Note (Signed)
 No chest pain, no acute coronary syndrome.  Plan to continue aspirin and B blocker Follow up on repeat echocardiogram.

## 2023-06-11 NOTE — Assessment & Plan Note (Signed)
 Continue statin.

## 2023-06-11 NOTE — Progress Notes (Signed)
 Progress Note   Patient: Gabrielle [name] ZOX:[id number] DOB: 16-Nov-1960 DOA: 06/10/2023     1 DOS: the patient was seen and examined on 06/11/2023   Brief hospital course: Mrs. Bevans was admitted to the hospital with the working diagnosis of small bowel obstruction.   63 yo female with the past medical history of coronary artery disease, diffuse large B cell lymphoma, GERD, and hyperlipidemia, who presented with abdominal pain.  Reported 24 hrs of abdominal and back pain, associated with nausea but not vomiting. Positive flatus and bowel movements.  On her initial physical examination her blood pressure was 132/74, HR 84, RR 16 and 02 saturation 95%. Lungs with no wheezing or rales, heart with S1 and S2 present and regular, abdomen soft, tender to palpation in the upper and left lower quadrant, no rebound or guarding, no lower extremity edema.   Na 137, K 4,2 Cl 101, bicarbonate 23 glucose 128 bun 16 cr 0,72 Lactic acid 1,6  Wbc 10,1 hgb 16,0 plt 211  Urine analysis SG 1,026, protein negative, negative leukocytes and negative hgb.   CT abdomen and pelvis with dilated loops of small bowel with scattered air fluid levels extending to a transition in the right abdomen where there is a suspected wall thickening in a bowel loop at least over a 4,5 cm segment. Prominent stoop in the distal segment and rectum.   Chest radiograph with hypoinflation, with no effusions or infiltrates. No cardiomegaly.  EKG 79 bpm, normal axis, normal intervals, sinus rhythm with no significant ST segment or T wave changes.   Assessment and Plan: * SBO (small bowel obstruction) (HCC) Today with no nausea or vomiting.   Plan to continue supportive medical therapy.  Started with clear liquids IV fluids and as needed analgesics and antiemetics.  Pantoprazole.    Diffuse non-Hodgkin's lymphoma (HCC) Follow up with Dr Jarrett Ables as outpatient.  Cell counts are consistent with no anemia, leukopenia or thrombocytopenia.    Chronic coronary artery disease No chest pain, no acute coronary syndrome.  Plan to continue aspirin and B blocker Follow up on repeat echocardiogram.   Essential hypertension Continue blood pressure control with metoprolol XL   Hyperlipidemia Continue statin therapy.   Anxiety and depression Resume paxil and lamictal per her home regimen.         Subjective: Patient with improvement in her nausea, no abdominal pain and no vomiting. No chest pain or dyspnea   Physical Exam: Vitals:   06/11/23 0600 06/11/23 0754 06/11/23 0755 06/11/23 0810  BP: (!) 103/57 104/78  113/71  Pulse: 78 84  79  Resp: 16 18  16   Temp:   98.9 F (37.2 C) 98.6 F (37 C)  TempSrc:   Oral Oral  SpO2: 96% 96%  100%  Weight:      Height:       Neurology awake and alert ENT with mild pallor with no icterus Cardiovascular with S1 and S2 present and regular with no gallops, rubs or murmurs Respiratory with no rales or wheezing, no rhonchi Abdomen with no distention, mild tender to deep palpation on the left side with no rebound or guarding No lower extremity edema  Data Reviewed:    Family Communication: I spoke with patient's husband at the bedside, we talked in detail about patient's condition, plan of care and prognosis and all questions were addressed.   Disposition: Status is: Inpatient Remains inpatient appropriate because: pending resolution of SBO   Planned Discharge Destination: Home  Author: Coralie Keens, MD 06/11/2023 12:19 PM  For on call review [email or web address].

## 2023-06-12 DIAGNOSIS — K5669 Other partial intestinal obstruction: Secondary | ICD-10-CM | POA: Diagnosis not present

## 2023-06-12 DIAGNOSIS — K5904 Chronic idiopathic constipation: Secondary | ICD-10-CM

## 2023-06-12 DIAGNOSIS — K5909 Other constipation: Secondary | ICD-10-CM | POA: Diagnosis not present

## 2023-06-12 DIAGNOSIS — R935 Abnormal findings on diagnostic imaging of other abdominal regions, including retroperitoneum: Secondary | ICD-10-CM

## 2023-06-12 DIAGNOSIS — I2510 Atherosclerotic heart disease of native coronary artery without angina pectoris: Secondary | ICD-10-CM | POA: Diagnosis not present

## 2023-06-12 DIAGNOSIS — K56609 Unspecified intestinal obstruction, unspecified as to partial versus complete obstruction: Secondary | ICD-10-CM | POA: Diagnosis not present

## 2023-06-12 DIAGNOSIS — R933 Abnormal findings on diagnostic imaging of other parts of digestive tract: Secondary | ICD-10-CM | POA: Diagnosis not present

## 2023-06-12 DIAGNOSIS — Z8572 Personal history of non-Hodgkin lymphomas: Secondary | ICD-10-CM | POA: Diagnosis not present

## 2023-06-12 DIAGNOSIS — I10 Essential (primary) hypertension: Secondary | ICD-10-CM | POA: Diagnosis not present

## 2023-06-12 DIAGNOSIS — C8580 Other specified types of non-Hodgkin lymphoma, unspecified site: Secondary | ICD-10-CM | POA: Diagnosis not present

## 2023-06-12 LAB — CBC
HCT: 38.5 % (ref 36.0–46.0)
Hemoglobin: 12 g/dL (ref 12.0–15.0)
MCH: 32.1 pg (ref 26.0–34.0)
MCHC: 31.2 g/dL (ref 30.0–36.0)
MCV: 102.9 fL — ABNORMAL HIGH (ref 80.0–100.0)
Platelets: 153 10*3/uL (ref 150–400)
RBC: 3.74 MIL/uL — ABNORMAL LOW (ref 3.87–5.11)
RDW: 13.1 % (ref 11.5–15.5)
WBC: 7 10*3/uL (ref 4.0–10.5)
nRBC: 0 % (ref 0.0–0.2)

## 2023-06-12 LAB — BASIC METABOLIC PANEL
Anion gap: 8 (ref 5–15)
BUN: 13 mg/dL (ref 8–23)
CO2: 26 mmol/L (ref 22–32)
Calcium: 8.5 mg/dL — ABNORMAL LOW (ref 8.9–10.3)
Chloride: 105 mmol/L (ref 98–111)
Creatinine, Ser: 0.7 mg/dL (ref 0.44–1.00)
GFR, Estimated: >60 mL/min (ref >60)
Glucose, Bld: 99 mg/dL (ref 70–99)
Potassium: 3.9 mmol/L (ref 3.5–5.1)
Sodium: 139 mmol/L (ref 135–145)

## 2023-06-12 MED ORDER — DEXTROSE-SODIUM CHLORIDE 5-0.9 % IV SOLN: INTRAVENOUS | Status: AC

## 2023-06-12 MED ORDER — MAGNESIUM HYDROXIDE 400 MG/5ML PO SUSP
30.0000 mL | Freq: Four times a day (QID) | ORAL | Status: DC | PRN
  Administered 2023-06-12: 30 mL via ORAL
  Filled 2023-06-12: qty 30

## 2023-06-12 NOTE — Progress Notes (Signed)
 Gastroenterology & Hepatology   Interval History: Patient have not had a bowel movement as she is chronically constipated even as outpatient.  Continues to pass gas  Inpatient Medications:  Current Facility-Administered Medications:    albuterol (PROVENTIL) (2.5 MG/3ML) 0.083% nebulizer solution 2.5 mg, 2.5 mg, Nebulization, Q2H PRN, Elgergawy, Leana Roe, MD   ALPRAZolam Prudy Feeler) tablet 0.5 mg, 0.5 mg, Oral, QHS PRN, Elgergawy, Leana Roe, MD   aspirin EC tablet 81 mg, 81 mg, Oral, Daily, Arrien, York Ram, MD, 81 mg at 06/12/23 1236   atorvastatin (LIPITOR) tablet 80 mg, 80 mg, Oral, Daily, Arrien, York Ram, MD, 80 mg at 06/12/23 1236   dextrose 5 %-0.9 % sodium chloride infusion, , Intravenous, Continuous, Arrien, York Ram, MD   enoxaparin (LOVENOX) injection 40 mg, 40 mg, Subcutaneous, QHS, Arrien, York Ram, MD, 40 mg at 06/11/23 2006   ezetimibe (ZETIA) tablet 10 mg, 10 mg, Oral, Daily, Arrien, York Ram, MD, 10 mg at 06/12/23 1236   hydrALAZINE (APRESOLINE) injection 5 mg, 5 mg, Intravenous, Q4H PRN, Elgergawy, Leana Roe, MD   lamoTRIgine (LAMICTAL) tablet 200 mg, 200 mg, Oral, Daily, Arrien, York Ram, MD, 200 mg at 06/12/23 1236   magnesium hydroxide (MILK OF MAGNESIA) suspension 30 mL, 30 mL, Oral, Q6H PRN, Franky Macho, MD, 30 mL at 06/12/23 1236   metoprolol succinate (TOPROL-XL) 24 hr tablet 12.5 mg, 12.5 mg, Oral, Daily, Arrien, York Ram, MD, 12.5 mg at 06/11/23 1429   morphine (PF) 2 MG/ML injection 2 mg, 2 mg, Intravenous, Q3H PRN, Elgergawy, Leana Roe, MD, 2 mg at 06/12/23 1005   ondansetron (ZOFRAN) tablet 4 mg, 4 mg, Oral, Q6H PRN **OR** ondansetron (ZOFRAN) injection 4 mg, 4 mg, Intravenous, Q6H PRN, Elgergawy, Leana Roe, MD, 4 mg at 06/10/23 2212   PARoxetine (PAXIL) tablet 40 mg, 40 mg, Oral, Daily, Arrien, York Ram, MD, 40 mg at 06/12/23 1236   I/O    Intake/Output Summary (Last 24 hours) at 06/12/2023 1456 Last data filed  at 06/12/2023 0940 Gross per 24 hour  Intake 697.43 ml  Output --  Net 697.43 ml     Physical Exam: Temp:  [98 F (36.7 C)-99.2 F (37.3 C)] 99.2 F (37.3 C) (02/16 1353) Pulse Rate:  [72-81] 81 (02/16 1353) Resp:  [14-20] 18 (02/16 1353) BP: (99-126)/(43-66) 126/66 (02/16 1353) SpO2:  [93 %-100 %] 100 % (02/16 1353)  Temp (24hrs), Avg:98.5 F (36.9 C), Min:98 F (36.7 C), Max:99.2 F (37.3 C)  GENERAL: The patient is AO x3, in no acute distress. HEENT: Head is normocephalic and atraumatic. EOMI are intact. Mouth is well hydrated and without lesions. NECK: Supple. No masses LUNGS: Clear to auscultation. No presence of rhonchi/wheezing/rales. Adequate chest expansion HEART: RRR, normal s1 and s2. ABDOMEN: Soft, nontender, no guarding, no peritoneal signs, and nondistended. BS +. No masses.  Laboratory Data: CBC:     Component Value Date/Time   WBC 7.0 06/12/2023 0532   RBC 3.74 (L) 06/12/2023 0532   HGB 12.0 06/12/2023 0532   HGB 13.5 03/21/2023 0751   HCT 38.5 06/12/2023 0532   PLT 153 06/12/2023 0532   PLT 179 03/21/2023 0751   MCV 102.9 (H) 06/12/2023 0532   MCH 32.1 06/12/2023 0532   MCHC 31.2 06/12/2023 0532   RDW 13.1 06/12/2023 0532   LYMPHSABS 1.0 06/10/2023 1427   MONOABS 0.5 06/10/2023 1427   EOSABS 0.1 06/10/2023 1427   BASOSABS 0.0 06/10/2023 1427   COAG:  Lab Results  Component Value Date  INR 0.98 06/04/2014    BMP:     Latest Ref Rng & Units 06/12/2023    5:32 AM 06/11/2023    5:51 AM 06/10/2023    2:27 PM  BMP  Glucose 70 - 99 mg/dL 99  161  096   BUN 8 - 23 mg/dL 13  18  16    Creatinine 0.44 - 1.00 mg/dL 0.45  4.09  8.11   Sodium 135 - 145 mmol/L 139  138  137   Potassium 3.5 - 5.1 mmol/L 3.9  4.4  4.2   Chloride 98 - 111 mmol/L 105  105  101   CO2 22 - 32 mmol/L 26  25  23    Calcium 8.9 - 10.3 mg/dL 8.5  8.2  9.6     HEPATIC:     Latest Ref Rng & Units 06/10/2023    2:27 PM 03/21/2023    7:51 AM 02/02/2023   12:41 PM  Hepatic  Function  Total Protein 6.5 - 8.1 g/dL 7.3  6.3  6.4   Albumin 3.5 - 5.0 g/dL 4.8  4.4  4.3   AST 15 - 41 U/L 37  25  31   ALT 0 - 44 U/L 33  25  30   Alk Phosphatase 38 - 126 U/L 124  104  101   Total Bilirubin 0.0 - 1.2 mg/dL 1.1  0.6  0.7     CARDIAC: No results found for: "CKTOTAL", "CKMB", "CKMBINDEX", "TROPONINI"    Imaging: I personally reviewed and interpreted the available labs, imaging and endoscopic files.   Assessment/Plan:  This is a 63 y.o. female with history of lymphoma and chronic constipation presented with abdominal pain and nausea.  Patient is admitted with partial small bowel obstruction.  GI is consulted for partial SBO, small intestine and sigmoid colon thickening with history of lymphoma.   #Abnormal CT #Partial SBO #Constipation    Differential remains partial SBO due to previous adhesions due to history of ex lap  Given multiple small bowel segment involvement this could be Crohn's disease . Need to rule out malignancy   Fortunately patient does not has vomiting,  benign abdominal exam, positive flatulence   Patient was supposed to have colonoscopy with Dr. Stan Head at [facility] last year but was lost to follow-up. It is imperative for patient to have follow-up colonoscopy as outpatient and appointment with gastroenterologist upon discharge   Agree with milk of magnesia and if does not improve may give MiraLAX daily  Vista Lawman, MD Gastroenterology and Hepatology [facility] Gastroenterology   This chart has been completed using [facility] Dictation software, and while attempts have been made to ensure accuracy , certain words and phrases may not be transcribed as intended

## 2023-06-12 NOTE — Progress Notes (Signed)
 Subjective: Patient feels full but has not had nausea or vomiting.  She has not had a bowel movement yet.  She did pass flatus yesterday evening.  Objective: Vital signs in last 24 hours: Temp:  [98 F (36.7 C)-98.6 F (37 C)] 98 F (36.7 C) (02/16 0428) Pulse Rate:  [72-78] 72 (02/16 0428) Resp:  [14-20] 20 (02/16 0428) BP: (99-112)/(43-56) 99/56 (02/16 0428) SpO2:  [93 %-99 %] 96 % (02/16 0428) Last BM Date : 06/08/23  Intake/Output from previous day: 02/15 0701 - 02/16 0700 In: 1121.3 [P.O.:370; I.V.:751.3] Out: -  Intake/Output this shift: Total I/O In: 360 [P.O.:360] Out: -   General appearance: alert, cooperative, and no distress GI: Soft, slightly distended.  Minimal bowel sounds appreciated.  No tenderness noted.  Lab Results:  Recent Labs    06/11/23 0551 06/12/23 0532  WBC 8.9 7.0  HGB 12.7 12.0  HCT 39.5 38.5  PLT 158 153   BMET Recent Labs    06/11/23 0551 06/12/23 0532  NA 138 139  K 4.4 3.9  CL 105 105  CO2 25 26  GLUCOSE 113* 99  BUN 18 13  CREATININE 0.72 0.70  CALCIUM 8.2* 8.5*   PT/INR No results for input(s): "LABPROT", "INR" in the last 72 hours.  Studies/Results: ECHOCARDIOGRAM COMPLETE Result Date: 06/11/2023    ECHOCARDIOGRAM REPORT   Patient Name:   Gabrielle [name] Date of Exam: 06/11/2023 Medical Rec #:  [id number]     Height:       64.0 in Accession #:    [id number]    Weight:       144.0 lb Date of Birth:  [birth year]      BSA:          1.701 m Patient Age:    62 years      BP:           113/71 mmHg Patient Gender: F             HR:           75 bpm. [facility] Procedure: 2D Echo, Cardiac Doppler and Color Doppler (Both Spectral and Color            Flow Doppler were utilized during procedure). Indications:     Pre-operative cardiovascular examination  History:         Patient has prior history of Echocardiogram examinations, most                  recent 12/26/2016. Previous Myocardial Infarction and CAD,                   Signs/Symptoms:Dyspnea; Risk Factors:Hypertension.  Sonographer:     Vern Claude Referring Phys:  [id number] Starleen Arms Diagnosing Phys: Weston Brass MD IMPRESSIONS  1. Left ventricular ejection fraction, by estimation, is 55 to 60%. The left ventricle has normal function. Left ventricular endocardial border not optimally defined to evaluate regional wall motion. Left ventricular diastolic parameters were normal.  2. Right ventricular systolic function is normal. The right ventricular size is normal. Tricuspid regurgitation signal is inadequate for assessing PA pressure.  3. The mitral valve is normal in structure. Trivial mitral valve regurgitation. No evidence of mitral stenosis.  4. The aortic valve is tricuspid. Aortic valve regurgitation is not visualized. No aortic stenosis is present.  5. The inferior vena cava is normal in size with greater than 50% respiratory variability, suggesting right atrial pressure of 3 mmHg. FINDINGS  Left Ventricle: Left ventricular ejection fraction, by estimation, is 55 to 60%. The left ventricle has normal function. Left ventricular endocardial border not optimally defined to evaluate regional wall motion. The left ventricular internal cavity size was normal in size. There is no left ventricular hypertrophy. Left ventricular diastolic parameters were normal. Right Ventricle: The right ventricular size is normal. No increase in right ventricular wall thickness. Right ventricular systolic function is normal. Tricuspid regurgitation signal is inadequate for assessing PA pressure. Left Atrium: Left atrial size was normal in size. Right Atrium: Right atrial size was normal in size. Pericardium: There is no evidence of pericardial effusion. Mitral Valve: The mitral valve is normal in structure. Trivial mitral valve regurgitation. No evidence of mitral valve stenosis. MV peak gradient, 3.4 mmHg. The mean mitral valve gradient is 2.0 mmHg. Tricuspid Valve: The tricuspid valve is  normal in structure. Tricuspid valve regurgitation is trivial. No evidence of tricuspid stenosis. Aortic Valve: The aortic valve is tricuspid. Aortic valve regurgitation is not visualized. No aortic stenosis is present. Aortic valve mean gradient measures 3.0 mmHg. Aortic valve peak gradient measures 6.4 mmHg. Aortic valve area, by VTI measures 2.02 cm. Pulmonic Valve: The pulmonic valve was not well visualized. Pulmonic valve regurgitation is trivial. No evidence of pulmonic stenosis. Aorta: The aortic root is normal in size and structure. Venous: The inferior vena cava is normal in size with greater than 50% respiratory variability, suggesting right atrial pressure of 3 mmHg. IAS/Shunts: The atrial septum is grossly normal.  LEFT VENTRICLE PLAX 2D LVIDd:         3.90 cm     Diastology LVIDs:         2.50 cm     LV e' medial:    9.68 cm/s LV PW:         0.70 cm     LV E/e' medial:  6.1 LV IVS:        0.70 cm     LV e' lateral:   9.25 cm/s LVOT diam:     1.80 cm     LV E/e' lateral: 6.4 LV SV:         50 LV SV Index:   29 LVOT Area:     2.54 cm  LV Volumes (MOD) LV vol d, MOD A2C: 88.6 ml LV vol d, MOD A4C: 95.0 ml LV vol s, MOD A2C: 28.5 ml LV vol s, MOD A4C: 40.6 ml LV SV MOD A2C:     60.1 ml LV SV MOD A4C:     95.0 ml LV SV MOD BP:      58.0 ml RIGHT VENTRICLE             IVC RV Basal diam:  2.90 cm     IVC diam: 1.20 cm RV Mid diam:    1.90 cm RV S prime:     13.40 cm/s TAPSE (M-mode): 2.4 cm LEFT ATRIUM             Index        RIGHT ATRIUM          Index LA diam:        2.70 cm 1.59 cm/m   RA Area:     5.62 cm LA Vol (A2C):   30.9 ml 18.16 ml/m  RA Volume:   7.84 ml  4.61 ml/m LA Vol (A4C):   37.0 ml 21.75 ml/m LA Biplane Vol: 34.5 ml 20.28 ml/m  AORTIC VALVE  PULMONIC VALVE AV Area (Vmax):    1.79 cm     PV Vmax:       0.80 m/s AV Area (Vmean):   1.83 cm     PV Peak grad:  2.6 mmHg AV Area (VTI):     2.02 cm AV Vmax:           126.00 cm/s AV Vmean:          83.500 cm/s AV VTI:             0.246 m AV Peak Grad:      6.4 mmHg AV Mean Grad:      3.0 mmHg LVOT Vmax:         88.60 cm/s LVOT Vmean:        59.900 cm/s LVOT VTI:          0.195 m LVOT/AV VTI ratio: 0.79  AORTA Ao Root diam: 2.70 cm Ao Asc diam:  2.70 cm MITRAL VALVE MV Area (PHT): 4.46 cm    SHUNTS MV Area VTI:   2.77 cm    Systemic VTI:  0.20 m MV Peak grad:  3.4 mmHg    Systemic Diam: 1.80 cm MV Mean grad:  2.0 mmHg MV Vmax:       0.92 m/s MV Vmean:      58.6 cm/s MV Decel Time: 170 msec MV E velocity: 58.80 cm/s MV A velocity: 66.10 cm/s MV E/A ratio:  0.89 Weston Brass MD Electronically signed by Weston Brass MD Signature Date/Time: 06/11/2023/2:45:42 PM    Final (Updated)    CT ABDOMEN PELVIS W CONTRAST Result Date: 06/10/2023 CLINICAL DATA:  Non-Hodgkin's lymphoma.  Abdominal pain. * Tracking Code: BO * EXAM: CT ABDOMEN AND PELVIS WITH CONTRAST TECHNIQUE: Multidetector CT imaging of the abdomen and pelvis was performed using the standard protocol following bolus administration of intravenous contrast. RADIATION DOSE REDUCTION: This exam was performed according to the departmental dose-optimization program which includes automated exposure control, adjustment of the mA and/or kV according to patient size and/or use of iterative reconstruction technique. CONTRAST:  OMNIPAQUE IOHEXOL 300 MG/ML  SOLN COMPARISON:  02/02/2023 FINDINGS: Lower chest: Bilateral breast implants. Stable scarring in both lower lobes and in the right middle lobe. Hepatobiliary: Stable 0.6 by 0.3 cm nonspecific lesion in segment 3 of the liver on image 21 series 2. No further imaging workup of this lesion is indicated. Gallbladder unremarkable. Pancreas: Unremarkable Spleen: Unremarkable Adrenals/Urinary Tract: Unremarkable Stomach/Bowel: Dilated loops of bowel with scattered air-fluid levels extending to a transition in the right abdomen shown on image 35 series 3 where there is suspected wall thickening in a bowel loop at least over a 4.5 cm  segment, subsequent mildly angulated turning of the loop, and possible mild accentuated mucosal enhancement in several loops of small bowel distal to this process. Appearance concerning for early or partial small bowel obstruction associated with inflamed or thick-walled loops of small bowel. No pneumatosis although there is some scattered ascites along the involved loops of small bowel. No extraluminal gas. No portal venous gas. Prominence of stool in the distal sigmoid colon and rectum, with some potential wall thickening and accentuated mucosal enhancement in the proximal sigmoid colon for example on image 35 series 3. Vascular/Lymphatic: There is only minimal aortic atherosclerosis. Pathologic mesenteric adenopathy is observed, index node 1.5 cm in short axis on image 41 series 2, stable. Reproductive: Unremarkable Other: Mild ascites along the dilated loops of small bowel. Musculoskeletal: Unremarkable IMPRESSION: 1. Dilated loops of  small bowel with scattered air-fluid levels extending to a transition in the right abdomen where there is suspected wall thickening in a bowel loop at least over a 4.5 cm segment, subsequent mildly angulated turning of the loop, and possible mild accentuated mucosal enhancement in several loops of small bowel distal to this process. Appearance concerning for early or partial small bowel obstruction associated with inflamed or thick-walled loops of small bowel. No pneumatosis although there is some scattered ascites along the involved loops of small bowel. No extraluminal gas. No portal venous gas. 2. Prominence of stool in the distal sigmoid colon and rectum, with some potential wall thickening and accentuated mucosal enhancement in the proximal sigmoid colon. The potential for multifocal inflammation including the involved small bowel segments and proximal sigmoid colon raise the possibility of Crohn's disease. 3. Stable pathologic mesenteric adenopathy, compatible with patient's  history of lymphoma. 4. Stable scarring in both lower lobes and in the right middle lobe. 5. Stable 0.6 by 0.3 cm nonspecific lesion in segment 3 of the liver. No further imaging workup of this lesion is indicated. 6. Minimal aortic atherosclerosis. Electronically Signed   By: Gaylyn Rong M.D.   On: 06/10/2023 16:44   DG Chest Port 1 View Result Date: 06/10/2023 CLINICAL DATA:  Upper abdominal pain, non-Hodgkin's lymphoma EXAM: PORTABLE CHEST 1 VIEW COMPARISON:  Overlapping portions CT abdomen 02/02/2023 FINDINGS: Scarring in both lung bases. Cardiac and mediastinal margins appear normal. No blunting of the costophrenic angles. No significant osseous abnormalities observed. IMPRESSION: 1. Scarring in both lung bases. No acute findings. Electronically Signed   By: Gaylyn Rong M.D.   On: 06/10/2023 16:33    Anti-infectives: Anti-infectives (From admission, onward)    None       Assessment/Plan: Impression: Partial small bowel obstruction secondary to possible adhesive disease, enteritis of unknown etiology. Plan: Continue conservative management.  Patient would like to try milk of magnesia as she uses this at home.  Should she continue to have no return of bowel function, a repeat CT scan with oral contrast may be indicated.  LOS: 2 days    Franky Macho 06/12/2023

## 2023-06-12 NOTE — Plan of Care (Signed)

## 2023-06-12 NOTE — Progress Notes (Addendum)
 Progress Note   Patient: Gabrielle [name] KGU:[id number] DOB: 11/14/1960 DOA: 06/10/2023     2 DOS: the patient was seen and examined on 06/12/2023   Brief hospital course: Gabrielle [name] was admitted to the hospital with the working diagnosis of small bowel obstruction.   63 yo female with the past medical history of coronary artery disease, diffuse large B cell lymphoma, GERD, and hyperlipidemia, who presented with abdominal pain.  Reported 24 hrs of abdominal and back pain, associated with nausea but not vomiting. Positive flatus and bowel movements.  On her initial physical examination her blood pressure was 132/74, HR 84, RR 16 and 02 saturation 95%. Lungs with no wheezing or rales, heart with S1 and S2 present and regular, abdomen soft, tender to palpation in the upper and left lower quadrant, no rebound or guarding, no lower extremity edema.   Na 137, K 4,2 Cl 101, bicarbonate 23 glucose 128 bun 16 cr 0,72 Lactic acid 1,6  Wbc 10,1 hgb 16,0 plt 211  Urine analysis SG 1,026, protein negative, negative leukocytes and negative hgb.   CT abdomen and pelvis with dilated loops of small bowel with scattered air fluid levels extending to a transition in the right abdomen where there is a suspected wall thickening in a bowel loop at least over a 4,5 cm segment. Prominent stoop in the distal segment and rectum.   Chest radiograph with hypoinflation, with no effusions or infiltrates. No cardiomegaly.  EKG 79 bpm, normal axis, normal intervals, sinus rhythm with no significant ST segment or T wave changes.   02/16 continue not resolved partial SBO, added milk of magnesia today. If no improving sings may need repeat CT abdomen and pelvis (with contrast).   Assessment and Plan: * SBO (small bowel obstruction) (HCC) Continue with no recovery of bowel function.   Plan to continue supportive medical therapy.  Started with clear liquids Added Milk of magnesia today.  IV fluids and as needed analgesics  and antiemetics.  Pantoprazole.   Patient will need outpatient colonoscopy.    Diffuse non-Hodgkin's lymphoma (HCC) Follow up with Dr Jarrett Ables as outpatient.  Cell counts are consistent with no anemia, leukopenia or thrombocytopenia.   Chronic coronary artery disease No chest pain, no acute coronary syndrome.  Plan to continue aspirin and B blocker Follow up on repeat echocardiogram.   Essential hypertension Continue blood pressure control with metoprolol XL   Follow up echocardiogram with preserved LV systolic function with EF 55 to 60%, RV systolic function preserved, no significant valvular disease.   Hyperlipidemia Continue statin and ezetimibe.   Anxiety and depression Continue with paxil and lamictal per her home regimen.    Subjective: Patient had flatus last night, so far no bowel movement, she has no nausea or vomiting.   Physical Exam: Vitals:   06/11/23 1411 06/11/23 1733 06/11/23 2209 06/12/23 0428  BP: (!) 112/53 (!) 104/43 (!) 104/48 (!) 99/56  Pulse: 78 72 77 72  Resp: 17 14 18 20   Temp: 98.6 F (37 C) 98.6 F (37 C) 98.2 F (36.8 C) 98 F (36.7 C)  TempSrc: Oral Oral Oral Oral  SpO2: 99% 95% 93% 96%  Weight:      Height:       Neurology awake and alert ENT with mild pallor with no icterus Cardiovascular with S1 and S2 present and regular with no gallops, rubs or murmurs Respiratory with no rales or wheezing Abdomen with mild distention, but non tender to deep palpation, no rebound or  guarding No lower extremity edema  Data Reviewed:    Family Communication: I spoke with patient's husband at the bedside, we talked in detail about patient's condition, plan of care and prognosis and all questions were addressed.   Disposition: Status is: Inpatient Remains inpatient appropriate because: SBO  Planned Discharge Destination: Home     Author: Coralie Keens, MD 06/12/2023 1:43 PM  For on call review [email or web address].

## 2023-06-13 DIAGNOSIS — K56609 Unspecified intestinal obstruction, unspecified as to partial versus complete obstruction: Secondary | ICD-10-CM | POA: Diagnosis not present

## 2023-06-13 LAB — CBC
HCT: 38.2 % (ref 36.0–46.0)
Hemoglobin: 12.1 g/dL (ref 12.0–15.0)
MCH: 31.9 pg (ref 26.0–34.0)
MCHC: 31.7 g/dL (ref 30.0–36.0)
MCV: 100.8 fL — ABNORMAL HIGH (ref 80.0–100.0)
Platelets: 166 10*3/uL (ref 150–400)
RBC: 3.79 MIL/uL — ABNORMAL LOW (ref 3.87–5.11)
RDW: 12.6 % (ref 11.5–15.5)
WBC: 6.4 10*3/uL (ref 4.0–10.5)
nRBC: 0 % (ref 0.0–0.2)

## 2023-06-13 LAB — BASIC METABOLIC PANEL
Anion gap: 5 (ref 5–15)
BUN: 8 mg/dL (ref 8–23)
CO2: 29 mmol/L (ref 22–32)
Calcium: 8.5 mg/dL — ABNORMAL LOW (ref 8.9–10.3)
Chloride: 107 mmol/L (ref 98–111)
Creatinine, Ser: 0.69 mg/dL (ref 0.44–1.00)
GFR, Estimated: >60 mL/min (ref >60)
Glucose, Bld: 118 mg/dL — ABNORMAL HIGH (ref 70–99)
Potassium: 4.1 mmol/L (ref 3.5–5.1)
Sodium: 141 mmol/L (ref 135–145)

## 2023-06-13 LAB — MAGNESIUM: Magnesium: 2.3 mg/dL (ref 1.7–2.4)

## 2023-06-13 MED ORDER — SENNOSIDES-DOCUSATE SODIUM 8.6-50 MG PO TABS: 1.0000 | ORAL_TABLET | Freq: Two times a day (BID) | ORAL | 0 refills | Status: DC

## 2023-06-13 MED ORDER — SENNOSIDES-DOCUSATE SODIUM 8.6-50 MG PO TABS: 1.0000 | ORAL_TABLET | Freq: Two times a day (BID) | ORAL | Status: DC

## 2023-06-13 NOTE — Progress Notes (Signed)
 [facility] Progress Note     Subjective: Patient seen and examined.  She is resting comfortably in bed.  She is tolerating her liquids without nausea and vomiting.  She took milk of magnesia yesterday and has had 4 bowel movements overnight.  She is passing flatus and is asking for more food.  Objective: Vital signs in last 24 hours: Temp:  [98.4 F (36.9 C)-99.2 F (37.3 C)] 98.4 F (36.9 C) (02/16 2226) Pulse Rate:  [71-81] 71 (02/16 2226) Resp:  [18] 18 (02/16 2226) BP: (126-128)/(64-66) 128/64 (02/16 2226) SpO2:  [98 %-100 %] 98 % (02/16 2226) Last BM Date : 06/10/23  Intake/Output from previous day: 02/16 0701 - 02/17 0700 In: 1156 [P.O.:360; I.V.:796] Out: -  Intake/Output this shift: No intake/output data recorded.  General appearance: alert, cooperative, and no distress GI: abdomen soft, nondistended, no percussion tenderness, nontender to palpation; no rigidity, guarding, or rebound tenderness  Lab Results:  Recent Labs    06/12/23 0532 06/13/23 0505  WBC 7.0 6.4  HGB 12.0 12.1  HCT 38.5 38.2  PLT 153 166   BMET Recent Labs    06/12/23 0532 06/13/23 0505  NA 139 141  K 3.9 4.1  CL 105 107  CO2 26 29  GLUCOSE 99 118*  BUN 13 8  CREATININE 0.70 0.69  CALCIUM 8.5* 8.5*   PT/INR No results for input(s): "LABPROT", "INR" in the last 72 hours.  Studies/Results: ECHOCARDIOGRAM COMPLETE Result Date: 06/11/2023    ECHOCARDIOGRAM REPORT   Patient Name:   Gabrielle [name] Date of Exam: 06/11/2023 Medical Rec #:  [id number]     Height:       64.0 in Accession #:    [id number]    Weight:       144.0 lb Date of Birth:  13-May-1960      BSA:          1.701 m Patient Age:    62 years      BP:           113/71 mmHg Patient Gender: F             HR:           75 bpm. [facility] Procedure: 2D Echo, Cardiac Doppler and Color Doppler (Both Spectral and Color            Flow Doppler were utilized during procedure). Indications:      Pre-operative cardiovascular examination  History:         Patient has prior history of Echocardiogram examinations, most                  recent 12/26/2016. Previous Myocardial Infarction and CAD,                  Signs/Symptoms:Dyspnea; Risk Factors:Hypertension.  Sonographer:     Vern Claude Referring Phys:  [id number] Starleen Arms Diagnosing Phys: Weston Brass MD IMPRESSIONS  1. Left ventricular ejection fraction, by estimation, is 55 to 60%. The left ventricle has normal function. Left ventricular endocardial border not optimally defined to evaluate regional wall motion. Left ventricular diastolic parameters were normal.  2. Right ventricular systolic function is normal. The right ventricular size is normal. Tricuspid regurgitation signal is inadequate for assessing PA pressure.  3. The mitral valve is normal in structure. Trivial mitral valve regurgitation. No evidence of mitral stenosis.  4. The aortic valve is tricuspid. Aortic valve regurgitation is not visualized. No aortic  stenosis is present.  5. The inferior vena cava is normal in size with greater than 50% respiratory variability, suggesting right atrial pressure of 3 mmHg. FINDINGS  Left Ventricle: Left ventricular ejection fraction, by estimation, is 55 to 60%. The left ventricle has normal function. Left ventricular endocardial border not optimally defined to evaluate regional wall motion. The left ventricular internal cavity size was normal in size. There is no left ventricular hypertrophy. Left ventricular diastolic parameters were normal. Right Ventricle: The right ventricular size is normal. No increase in right ventricular wall thickness. Right ventricular systolic function is normal. Tricuspid regurgitation signal is inadequate for assessing PA pressure. Left Atrium: Left atrial size was normal in size. Right Atrium: Right atrial size was normal in size. Pericardium: There is no evidence of pericardial effusion. Mitral Valve: The mitral  valve is normal in structure. Trivial mitral valve regurgitation. No evidence of mitral valve stenosis. MV peak gradient, 3.4 mmHg. The mean mitral valve gradient is 2.0 mmHg. Tricuspid Valve: The tricuspid valve is normal in structure. Tricuspid valve regurgitation is trivial. No evidence of tricuspid stenosis. Aortic Valve: The aortic valve is tricuspid. Aortic valve regurgitation is not visualized. No aortic stenosis is present. Aortic valve mean gradient measures 3.0 mmHg. Aortic valve peak gradient measures 6.4 mmHg. Aortic valve area, by VTI measures 2.02 cm. Pulmonic Valve: The pulmonic valve was not well visualized. Pulmonic valve regurgitation is trivial. No evidence of pulmonic stenosis. Aorta: The aortic root is normal in size and structure. Venous: The inferior vena cava is normal in size with greater than 50% respiratory variability, suggesting right atrial pressure of 3 mmHg. IAS/Shunts: The atrial septum is grossly normal.  LEFT VENTRICLE PLAX 2D LVIDd:         3.90 cm     Diastology LVIDs:         2.50 cm     LV e' medial:    9.68 cm/s LV PW:         0.70 cm     LV E/e' medial:  6.1 LV IVS:        0.70 cm     LV e' lateral:   9.25 cm/s LVOT diam:     1.80 cm     LV E/e' lateral: 6.4 LV SV:         50 LV SV Index:   29 LVOT Area:     2.54 cm  LV Volumes (MOD) LV vol d, MOD A2C: 88.6 ml LV vol d, MOD A4C: 95.0 ml LV vol s, MOD A2C: 28.5 ml LV vol s, MOD A4C: 40.6 ml LV SV MOD A2C:     60.1 ml LV SV MOD A4C:     95.0 ml LV SV MOD BP:      58.0 ml RIGHT VENTRICLE             IVC RV Basal diam:  2.90 cm     IVC diam: 1.20 cm RV Mid diam:    1.90 cm RV S prime:     13.40 cm/s TAPSE (M-mode): 2.4 cm LEFT ATRIUM             Index        RIGHT ATRIUM          Index LA diam:        2.70 cm 1.59 cm/m   RA Area:     5.62 cm LA Vol (A2C):   30.9 ml 18.16 ml/m  RA Volume:   7.84 ml  4.61 ml/m LA Vol (A4C):   37.0 ml 21.75 ml/m LA Biplane Vol: 34.5 ml 20.28 ml/m  AORTIC VALVE                    PULMONIC  VALVE AV Area (Vmax):    1.79 cm     PV Vmax:       0.80 m/s AV Area (Vmean):   1.83 cm     PV Peak grad:  2.6 mmHg AV Area (VTI):     2.02 cm AV Vmax:           126.00 cm/s AV Vmean:          83.500 cm/s AV VTI:            0.246 m AV Peak Grad:      6.4 mmHg AV Mean Grad:      3.0 mmHg LVOT Vmax:         88.60 cm/s LVOT Vmean:        59.900 cm/s LVOT VTI:          0.195 m LVOT/AV VTI ratio: 0.79  AORTA Ao Root diam: 2.70 cm Ao Asc diam:  2.70 cm MITRAL VALVE MV Area (PHT): 4.46 cm    SHUNTS MV Area VTI:   2.77 cm    Systemic VTI:  0.20 m MV Peak grad:  3.4 mmHg    Systemic Diam: 1.80 cm MV Mean grad:  2.0 mmHg MV Vmax:       0.92 m/s MV Vmean:      58.6 cm/s MV Decel Time: 170 msec MV E velocity: 58.80 cm/s MV A velocity: 66.10 cm/s MV E/A ratio:  0.89 Weston Brass MD Electronically signed by Weston Brass MD Signature Date/Time: 06/11/2023/2:45:42 PM    Final (Updated)     Anti-infectives: Anti-infectives (From admission, onward)    None       Assessment/Plan:  Patient is a 63 year old female who was admitted with a partial small bowel obstruction though to be secondary to enteritis vs adhesive disease.  -Patient feels better and has bowel function -Diet advanced to GI soft -PRN pain control and antiemetics -Continue bowel regimen -If patient tolerates soft, diet, stable for discharge from a general surgery standpoint -Patient to follow up with GI in [facility] for colonoscopy -Care per hospitalist   LOS: 3 days    Gabrielle [name] A Dhiren Azimi 06/13/2023

## 2023-06-13 NOTE — Progress Notes (Signed)
 Gastroenterology Progress Note    Primary Gastroenterologist:  Dr. Leone Payor  Patient ID: Gabrielle [name]; [id number]; 22-Sep-1960    Subjective   Passing gas.  Normal BM this morning and had instant relief. No rectal bleeding. Abdominal pain improved. Eager go go home.    Objective   Vital signs in last 24 hours Temp:  [98.4 F (36.9 C)-99.2 F (37.3 C)] 98.4 F (36.9 C) (02/16 2226) Pulse Rate:  [71-81] 71 (02/16 2226) Resp:  [18] 18 (02/16 2226) BP: (126-128)/(64-66) 128/64 (02/16 2226) SpO2:  [98 %-100 %] 98 % (02/16 2226) Last BM Date : 06/10/23  Physical Exam General:   Alert and oriented, pleasant Head:  Normocephalic and atraumatic. Abdomen:  Bowel sounds present, soft, non-tender, non-distended. No HSM or hernias noted. No rebound or guarding. No masses appreciated  Msk:  Symmetrical without gross deformities. Normal posture. Psych:  Alert and cooperative. Normal mood and affect.  Intake/Output from previous day: 02/16 0701 - 02/17 0700 In: 1156 [P.O.:360; I.V.:796] Out: -  Intake/Output this shift: No intake/output data recorded.  Lab Results  Recent Labs    06/11/23 0551 06/12/23 0532 06/13/23 0505  WBC 8.9 7.0 6.4  HGB 12.7 12.0 12.1  HCT 39.5 38.5 38.2  PLT 158 153 166   BMET Recent Labs    06/11/23 0551 06/12/23 0532 06/13/23 0505  NA 138 139 141  K 4.4 3.9 4.1  CL 105 105 107  CO2 25 26 29   GLUCOSE 113* 99 118*  BUN 18 13 8   CREATININE 0.72 0.70 0.69  CALCIUM 8.2* 8.5* 8.5*   LFT Recent Labs    06/10/23 1427  PROT 7.3  ALBUMIN 4.8  AST 37  ALT 33  ALKPHOS 124  BILITOT 1.1   Studies/Results ECHOCARDIOGRAM COMPLETE Result Date: 06/11/2023    ECHOCARDIOGRAM REPORT   Patient Name:   SAADIYA WILFONG Date of Exam: 06/11/2023 Medical Rec #:  [id number]     Height:       64.0 in Accession #:    [id number]    Weight:       144.0 lb Date of Birth:  May 18, 1960      BSA:          1.701 m Patient Age:    63 years      BP:           113/71 mmHg  Patient Gender: F             HR:           75 bpm. [facility] Procedure: 2D Echo, Cardiac Doppler and Color Doppler (Both Spectral and Color            Flow Doppler were utilized during procedure). Indications:     Pre-operative cardiovascular examination  History:         Patient has prior history of Echocardiogram examinations, most                  recent 12/26/2016. Previous Myocardial Infarction and CAD,                  Signs/Symptoms:Dyspnea; Risk Factors:Hypertension.  Sonographer:     Vern Claude Referring Phys:  [id number] Starleen Arms Diagnosing Phys: Weston Brass MD IMPRESSIONS  1. Left ventricular ejection fraction, by estimation, is 55 to 60%. The left ventricle has normal function. Left ventricular endocardial border not optimally defined to evaluate regional wall motion. Left ventricular diastolic parameters were normal.  2. Right ventricular systolic function is normal. The right ventricular size is normal. Tricuspid regurgitation signal is inadequate for assessing PA pressure.  3. The mitral valve is normal in structure. Trivial mitral valve regurgitation. No evidence of mitral stenosis.  4. The aortic valve is tricuspid. Aortic valve regurgitation is not visualized. No aortic stenosis is present.  5. The inferior vena cava is normal in size with greater than 50% respiratory variability, suggesting right atrial pressure of 3 mmHg. FINDINGS  Left Ventricle: Left ventricular ejection fraction, by estimation, is 55 to 60%. The left ventricle has normal function. Left ventricular endocardial border not optimally defined to evaluate regional wall motion. The left ventricular internal cavity size was normal in size. There is no left ventricular hypertrophy. Left ventricular diastolic parameters were normal. Right Ventricle: The right ventricular size is normal. No increase in right ventricular wall thickness. Right ventricular systolic function is normal. Tricuspid regurgitation signal  is inadequate for assessing PA pressure. Left Atrium: Left atrial size was normal in size. Right Atrium: Right atrial size was normal in size. Pericardium: There is no evidence of pericardial effusion. Mitral Valve: The mitral valve is normal in structure. Trivial mitral valve regurgitation. No evidence of mitral valve stenosis. MV peak gradient, 3.4 mmHg. The mean mitral valve gradient is 2.0 mmHg. Tricuspid Valve: The tricuspid valve is normal in structure. Tricuspid valve regurgitation is trivial. No evidence of tricuspid stenosis. Aortic Valve: The aortic valve is tricuspid. Aortic valve regurgitation is not visualized. No aortic stenosis is present. Aortic valve mean gradient measures 3.0 mmHg. Aortic valve peak gradient measures 6.4 mmHg. Aortic valve area, by VTI measures 2.02 cm. Pulmonic Valve: The pulmonic valve was not well visualized. Pulmonic valve regurgitation is trivial. No evidence of pulmonic stenosis. Aorta: The aortic root is normal in size and structure. Venous: The inferior vena cava is normal in size with greater than 50% respiratory variability, suggesting right atrial pressure of 3 mmHg. IAS/Shunts: The atrial septum is grossly normal.  LEFT VENTRICLE PLAX 2D LVIDd:         3.90 cm     Diastology LVIDs:         2.50 cm     LV e' medial:    9.68 cm/s LV PW:         0.70 cm     LV E/e' medial:  6.1 LV IVS:        0.70 cm     LV e' lateral:   9.25 cm/s LVOT diam:     1.80 cm     LV E/e' lateral: 6.4 LV SV:         50 LV SV Index:   29 LVOT Area:     2.54 cm  LV Volumes (MOD) LV vol d, MOD A2C: 88.6 ml LV vol d, MOD A4C: 95.0 ml LV vol s, MOD A2C: 28.5 ml LV vol s, MOD A4C: 40.6 ml LV SV MOD A2C:     60.1 ml LV SV MOD A4C:     95.0 ml LV SV MOD BP:      58.0 ml RIGHT VENTRICLE             IVC RV Basal diam:  2.90 cm     IVC diam: 1.20 cm RV Mid diam:    1.90 cm RV S prime:     13.40 cm/s TAPSE (M-mode): 2.4 cm LEFT ATRIUM             Index  RIGHT ATRIUM          Index LA diam:         2.70 cm 1.59 cm/m   RA Area:     5.62 cm LA Vol (A2C):   30.9 ml 18.16 ml/m  RA Volume:   7.84 ml  4.61 ml/m LA Vol (A4C):   37.0 ml 21.75 ml/m LA Biplane Vol: 34.5 ml 20.28 ml/m  AORTIC VALVE                    PULMONIC VALVE AV Area (Vmax):    1.79 cm     PV Vmax:       0.80 m/s AV Area (Vmean):   1.83 cm     PV Peak grad:  2.6 mmHg AV Area (VTI):     2.02 cm AV Vmax:           126.00 cm/s AV Vmean:          83.500 cm/s AV VTI:            0.246 m AV Peak Grad:      6.4 mmHg AV Mean Grad:      3.0 mmHg LVOT Vmax:         88.60 cm/s LVOT Vmean:        59.900 cm/s LVOT VTI:          0.195 m LVOT/AV VTI ratio: 0.79  AORTA Ao Root diam: 2.70 cm Ao Asc diam:  2.70 cm MITRAL VALVE MV Area (PHT): 4.46 cm    SHUNTS MV Area VTI:   2.77 cm    Systemic VTI:  0.20 m MV Peak grad:  3.4 mmHg    Systemic Diam: 1.80 cm MV Mean grad:  2.0 mmHg MV Vmax:       0.92 m/s MV Vmean:      58.6 cm/s MV Decel Time: 170 msec MV E velocity: 58.80 cm/s MV A velocity: 66.10 cm/s MV E/A ratio:  0.89 Weston Brass MD Electronically signed by Weston Brass MD Signature Date/Time: 06/11/2023/2:45:42 PM    Final (Updated)    CT ABDOMEN PELVIS W CONTRAST Result Date: 06/10/2023 CLINICAL DATA:  Non-Hodgkin's lymphoma.  Abdominal pain. * Tracking Code: BO * EXAM: CT ABDOMEN AND PELVIS WITH CONTRAST TECHNIQUE: Multidetector CT imaging of the abdomen and pelvis was performed using the standard protocol following bolus administration of intravenous contrast. RADIATION DOSE REDUCTION: This exam was performed according to the departmental dose-optimization program which includes automated exposure control, adjustment of the mA and/or kV according to patient size and/or use of iterative reconstruction technique. CONTRAST:  OMNIPAQUE IOHEXOL 300 MG/ML  SOLN COMPARISON:  02/02/2023 FINDINGS: Lower chest: Bilateral breast implants. Stable scarring in both lower lobes and in the right middle lobe. Hepatobiliary: Stable 0.6 by 0.3 cm  nonspecific lesion in segment 3 of the liver on image 21 series 2. No further imaging workup of this lesion is indicated. Gallbladder unremarkable. Pancreas: Unremarkable Spleen: Unremarkable Adrenals/Urinary Tract: Unremarkable Stomach/Bowel: Dilated loops of bowel with scattered air-fluid levels extending to a transition in the right abdomen shown on image 35 series 3 where there is suspected wall thickening in a bowel loop at least over a 4.5 cm segment, subsequent mildly angulated turning of the loop, and possible mild accentuated mucosal enhancement in several loops of small bowel distal to this process. Appearance concerning for early or partial small bowel obstruction associated with inflamed or thick-walled loops of small bowel. No pneumatosis although there is  some scattered ascites along the involved loops of small bowel. No extraluminal gas. No portal venous gas. Prominence of stool in the distal sigmoid colon and rectum, with some potential wall thickening and accentuated mucosal enhancement in the proximal sigmoid colon for example on image 35 series 3. Vascular/Lymphatic: There is only minimal aortic atherosclerosis. Pathologic mesenteric adenopathy is observed, index node 1.5 cm in short axis on image 41 series 2, stable. Reproductive: Unremarkable Other: Mild ascites along the dilated loops of small bowel. Musculoskeletal: Unremarkable IMPRESSION: 1. Dilated loops of small bowel with scattered air-fluid levels extending to a transition in the right abdomen where there is suspected wall thickening in a bowel loop at least over a 4.5 cm segment, subsequent mildly angulated turning of the loop, and possible mild accentuated mucosal enhancement in several loops of small bowel distal to this process. Appearance concerning for early or partial small bowel obstruction associated with inflamed or thick-walled loops of small bowel. No pneumatosis although there is some scattered ascites along the involved  loops of small bowel. No extraluminal gas. No portal venous gas. 2. Prominence of stool in the distal sigmoid colon and rectum, with some potential wall thickening and accentuated mucosal enhancement in the proximal sigmoid colon. The potential for multifocal inflammation including the involved small bowel segments and proximal sigmoid colon raise the possibility of Crohn's disease. 3. Stable pathologic mesenteric adenopathy, compatible with patient's history of lymphoma. 4. Stable scarring in both lower lobes and in the right middle lobe. 5. Stable 0.6 by 0.3 cm nonspecific lesion in segment 3 of the liver. No further imaging workup of this lesion is indicated. 6. Minimal aortic atherosclerosis. Electronically Signed   By: Gaylyn Rong M.D.   On: 06/10/2023 16:44   DG Chest Port 1 View Result Date: 06/10/2023 CLINICAL DATA:  Upper abdominal pain, non-Hodgkin's lymphoma EXAM: PORTABLE CHEST 1 VIEW COMPARISON:  Overlapping portions CT abdomen 02/02/2023 FINDINGS: Scarring in both lung bases. Cardiac and mediastinal margins appear normal. No blunting of the costophrenic angles. No significant osseous abnormalities observed. IMPRESSION: 1. Scarring in both lung bases. No acute findings. Electronically Signed   By: Gaylyn Rong M.D.   On: 06/10/2023 16:33    Assessment  63 y.o. female with a history of lymphoma, chronic constipation, presenting this admission with abdominal pain, nausea, and admitted with partial SBO. GI consulted due to SBO and abnormal CT with small bowel inflammation, potential wall thickening of proximal sigmoid.  Clinically, she has improved and notes regular BM this morning. From a GI standpoint, she is appropriate for discharge home. We can advance to a soft diet.   Differentials for partial SBO including prior adhesions from surgery, need to rule out malignancy, less likely small bowel Crohn's. Needs outpatient colonoscopy to rule out malignancy and can do this with  primary GI ([facility]). Notably, she was last seen by Dr. Leone Payor in Oct 2024 with plans for screening colonoscopy but was lost to follow-up as needed repeat ECHO (prior ECHO EF 20-25%. ) Inpatient ECHO with EF 55-60%.   GI will sign off. I communicated with hospitalist that she is appropriate from our standpoint. Husband knows Dr. Leone Payor personally and is reaching out to make outpatient appointment.     Plan / Recommendations  Advance to soft diet Follow-up with Dr. Leone Payor Outpatient colonoscopy GI signing off: appropriate for discharge home from our standpoint.     LOS: 3 days    06/13/2023, 8:09 AM  Gelene Mink, PhD, ANP-BC [facility]

## 2023-06-13 NOTE — Plan of Care (Signed)
 Rested during the night. Had an unwitnessed large bowel movement during the shift.  Problem: Education: Goal: Knowledge of General Education information will improve Description: Including pain rating scale, medication(s)/side effects and non-pharmacologic comfort measures Outcome: Progressing   Problem: Health Behavior/Discharge Planning: Goal: Ability to manage health-related needs will improve Outcome: Progressing   Problem: Clinical Measurements: Goal: Ability to maintain clinical measurements within normal limits will improve Outcome: Progressing Goal: Will remain free from infection Outcome: Progressing Goal: Diagnostic test results will improve Outcome: Progressing Goal: Respiratory complications will improve Outcome: Progressing Goal: Cardiovascular complication will be avoided Outcome: Progressing   Problem: Activity: Goal: Risk for activity intolerance will decrease Outcome: Progressing   Problem: Nutrition: Goal: Adequate nutrition will be maintained Outcome: Progressing   Problem: Coping: Goal: Level of anxiety will decrease Outcome: Progressing   Problem: Elimination: Goal: Will not experience complications related to bowel motility Outcome: Progressing Goal: Will not experience complications related to urinary retention Outcome: Progressing   Problem: Pain Managment: Goal: General experience of comfort will improve and/or be controlled Outcome: Progressing   Problem: Safety: Goal: Ability to remain free from injury will improve Outcome: Progressing   Problem: Skin Integrity: Goal: Risk for impaired skin integrity will decrease Outcome: Progressing

## 2023-06-13 NOTE — Plan of Care (Signed)

## 2023-06-13 NOTE — Progress Notes (Signed)
 Reviewed discharge papers with pt, pt verbalized understanding.

## 2023-06-13 NOTE — Discharge Summary (Signed)
 Physician Discharge Summary   Gabrielle Gabrielle [name]: Gabrielle Gabrielle [name] MRN: [id number] DOB: 05-21-1960  Admit date:     06/10/2023  Discharge date: 06/13/23  Discharge Physician: Tyrone Nine   PCP: Kirstie Peri, MD   Recommendations at discharge:  Follow up with GI, Dr. Leone Payor, for outpatient colonoscopy.   Discharge Diagnoses: Principal Problem:   SBO (small bowel obstruction) (HCC) Active Problems:   Diffuse non-Hodgkin's lymphoma (HCC)   Chronic coronary artery disease   Essential hypertension   Hyperlipidemia   Anxiety and depression   Abnormal CT of the abdomen   Chronic idiopathic constipation   Small bowel obstruction (HCC)  Resolved Problems:   * No resolved hospital problems. [facility] Course: Gabrielle Gabrielle [name] was admitted to the hospital with the working diagnosis of small bowel obstruction.    63 yo Gabrielle [name] with the past medical history of coronary artery disease, diffuse large B cell lymphoma, GERD, and hyperlipidemia, who presented with abdominal pain.  Reported 24 hrs of abdominal and back pain, associated with nausea but not vomiting. Positive flatus and bowel movements.  On her initial physical examination her blood pressure was 132/74, HR 84, RR 16 and 02 saturation 95%. Lungs with no wheezing or rales, heart with S1 and S2 present and regular, abdomen soft, tender to palpation in the upper and left lower quadrant, no rebound or guarding, no lower extremity edema.    Na 137, K 4,2 Cl 101, bicarbonate 23 glucose 128 bun 16 cr 0,72 Lactic acid 1,6  Wbc 10,1 hgb 16,0 plt 211  Urine analysis SG 1,026, protein negative, negative leukocytes and negative hgb.    CT abdomen and pelvis with dilated loops of small bowel with scattered air fluid levels extending to a transition in the right abdomen where there is a suspected wall thickening in a bowel loop at least over a 4,5 cm segment. Prominent stoop in the distal segment and rectum.    Chest radiograph with hypoinflation, with no  effusions or infiltrates. No cardiomegaly.   EKG 79 bpm, normal axis, normal intervals, sinus rhythm with no significant ST segment or T wave changes.    02/16 continue not resolved partial SBO, added milk of magnesia and has had significant improvement. Full return of bowel function, resolution of symptoms. GI and general surgery have cleared her for discharge on 06/13/2023 with plans for outpatient colonoscopy with her primary GI in [place], Dr. Leone Payor.   Assessment and Plan: SBO (small bowel obstruction) (HCC): Resolved with conservative measures. Cleared for DC by surgery, Dr. Robyne Peers, and [facility]. - Gabrielle Gabrielle [name] will need outpatient colonoscopy w/Dr. Leone Payor.  - Continue regular bowel regimen   Diffuse non-Hodgkin's lymphoma (HCC) Follow up with Dr Jarrett Ables as outpatient.  Cell counts are stable with no anemia, leukopenia or thrombocytopenia.    Chronic coronary artery disease No chest pain, no acute coronary syndrome.  Plan to continue aspirin and B blocker - Repeat echocardiogram shows preserved LVEF and no significant heart failure   Essential hypertension Continue blood pressure control with metoprolol XL    Follow up echocardiogram with preserved LV systolic function with EF 55 to 60%, RV systolic function preserved, no significant valvular disease.    Hyperlipidemia Continue statin and ezetimibe.    Anxiety and depression Continue with paxil and lamictal per her home regimen.    Consultants: GI, surgery Procedures performed: None  Disposition: Home Diet recommendation: Heart healthy as tolerated. DISCHARGE MEDICATION: Allergies as of 06/13/2023       Reactions  Cephalosporins Anaphylaxis   Rocephin [ceftriaxone] Anaphylaxis, Hives, Shortness Of Breath   Facial numbness   Sulfa Antibiotics Itching, Rash        Medication List     TAKE these medications    ALPRAZolam 0.5 MG tablet Commonly known as: XANAX Take 1 tablet (0.5 mg total) by mouth 3 (three)  times daily. What changed:  when to take this reasons to take this   aspirin EC 81 MG tablet Take 81 mg by mouth daily.   atorvastatin 80 MG tablet Commonly known as: LIPITOR TAKE 1 TABLET EVERY DAY   ciprofloxacin 250 MG tablet Commonly known as: CIPRO Take 250 mg by mouth 2 (two) times daily.   ezetimibe 10 MG tablet Commonly known as: Zetia Take 1 tablet (10 mg total) by mouth daily.   lamoTRIgine 200 MG tablet Commonly known as: LAMICTAL Take 200 mg by mouth daily.   metoprolol succinate 25 MG 24 hr tablet Commonly known as: TOPROL-XL TAKE 1/2 TABLET ONE TIME DAILY   MILK OF MAGNESIA PO Take 15 mLs by mouth every other day.   MULTIVITAMIN ADULT PO Take by mouth daily.   naproxen sodium 220 MG tablet Commonly known as: ALEVE Take 220 mg by mouth daily as needed (back pain).   nitroGLYCERIN 0.4 MG SL tablet Commonly known as: NITROSTAT Place 0.4 mg under the tongue every 5 (five) minutes as needed for chest pain.   PARoxetine 40 MG tablet Commonly known as: PAXIL Take 1 tablet (40 mg total) by mouth daily.   senna-docusate 8.6-50 MG tablet Commonly known as: Senokot-S Take 1 tablet by mouth 2 (two) times daily.        Follow-up Information     Kirstie Peri, MD Follow up.   Specialty: Internal Medicine Contact information: [address] [place] [address] [phone]         Iva Boop, MD. Schedule an appointment as soon as possible for a visit.   Specialty: Gastroenterology Contact information: [address] [place] [address] [phone]         Ladene Artist, MD Follow up.   Specialty: Oncology Contact information: [address] [place] [address] [phone]                Discharge Exam: Ceasar Mons Weights   06/10/23 1356  Weight: 65.3 kg  Pleasant, well-appearing Gabrielle [name] in no distress Clear, nonlabored RRR, no MRG or pitting edema Soft, hypoactive bowel sounds, nontender, nondistended.    Condition at discharge: stable Future Appointments  Date Time Provider Department Center  11/16/2023  9:40 AM Ladene Artist, MD [facility] None     The results of significant diagnostics from this hospitalization (including imaging, microbiology, ancillary and laboratory) are listed below for reference.   Imaging Studies: ECHOCARDIOGRAM COMPLETE Result Date: 06/11/2023    ECHOCARDIOGRAM REPORT   Gabrielle Gabrielle [name] Name:   LINDEY RENZULLI Date of Exam: 06/11/2023 Medical Rec #:  [id number]     Height:       64.0 in Accession #:    [id number]    Weight:       144.0 lb Date of Birth:  04-28-1960      BSA:          1.701 m Gabrielle Gabrielle [name] Age:    62 years      BP:           113/71 mmHg Gabrielle Gabrielle [name] Gender: F             HR:  75 bpm. [facility]am [facility] Procedure: 2D Echo, Cardiac Doppler and Color Doppler (Both Spectral and Color            Flow Doppler were utilized during procedure). Indications:     Pre-operative cardiovascular examination  History:         Gabrielle Gabrielle [name] has prior history of Echocardiogram examinations, most                  recent 12/26/2016. Previous Myocardial Infarction and CAD,                  Signs/Symptoms:Dyspnea; Risk Factors:Hypertension.  Sonographer:     Vern Claude Referring Phys:  [id number] Starleen Arms Diagnosing Phys: Weston Brass MD IMPRESSIONS  1. Left ventricular ejection fraction, by estimation, is 55 to 60%. The left ventricle has normal function. Left ventricular endocardial border not optimally defined to evaluate regional wall motion. Left ventricular diastolic parameters were normal.  2. Right ventricular systolic function is normal. The right ventricular size is normal. Tricuspid regurgitation signal is inadequate for assessing PA pressure.  3. The mitral valve is normal in structure. Trivial mitral valve regurgitation. No evidence of mitral stenosis.  4. The aortic valve is tricuspid. Aortic valve regurgitation is not visualized. No aortic stenosis is present.  5. The  inferior vena cava is normal in size with greater than 50% respiratory variability, suggesting right atrial pressure of 3 mmHg. FINDINGS  Left Ventricle: Left ventricular ejection fraction, by estimation, is 55 to 60%. The left ventricle has normal function. Left ventricular endocardial border not optimally defined to evaluate regional wall motion. The left ventricular internal cavity size was normal in size. There is no left ventricular hypertrophy. Left ventricular diastolic parameters were normal. Right Ventricle: The right ventricular size is normal. No increase in right ventricular wall thickness. Right ventricular systolic function is normal. Tricuspid regurgitation signal is inadequate for assessing PA pressure. Left Atrium: Left atrial size was normal in size. Right Atrium: Right atrial size was normal in size. Pericardium: There is no evidence of pericardial effusion. Mitral Valve: The mitral valve is normal in structure. Trivial mitral valve regurgitation. No evidence of mitral valve stenosis. MV peak gradient, 3.4 mmHg. The mean mitral valve gradient is 2.0 mmHg. Tricuspid Valve: The tricuspid valve is normal in structure. Tricuspid valve regurgitation is trivial. No evidence of tricuspid stenosis. Aortic Valve: The aortic valve is tricuspid. Aortic valve regurgitation is not visualized. No aortic stenosis is present. Aortic valve mean gradient measures 3.0 mmHg. Aortic valve peak gradient measures 6.4 mmHg. Aortic valve area, by VTI measures 2.02 cm. Pulmonic Valve: The pulmonic valve was not well visualized. Pulmonic valve regurgitation is trivial. No evidence of pulmonic stenosis. Aorta: The aortic root is normal in size and structure. Venous: The inferior vena cava is normal in size with greater than 50% respiratory variability, suggesting right atrial pressure of 3 mmHg. IAS/Shunts: The atrial septum is grossly normal.  LEFT VENTRICLE PLAX 2D LVIDd:         3.90 cm     Diastology LVIDs:         2.50  cm     LV e' medial:    9.68 cm/s LV PW:         0.70 cm     LV E/e' medial:  6.1 LV IVS:        0.70 cm     LV e' lateral:   9.25 cm/s LVOT diam:     1.80  cm     LV E/e' lateral: 6.4 LV SV:         50 LV SV Index:   29 LVOT Area:     2.54 cm  LV Volumes (MOD) LV vol d, MOD A2C: 88.6 ml LV vol d, MOD A4C: 95.0 ml LV vol s, MOD A2C: 28.5 ml LV vol s, MOD A4C: 40.6 ml LV SV MOD A2C:     60.1 ml LV SV MOD A4C:     95.0 ml LV SV MOD BP:      58.0 ml RIGHT VENTRICLE             IVC RV Basal diam:  2.90 cm     IVC diam: 1.20 cm RV Mid diam:    1.90 cm RV S prime:     13.40 cm/s TAPSE (M-mode): 2.4 cm LEFT ATRIUM             Index        RIGHT ATRIUM          Index LA diam:        2.70 cm 1.59 cm/m   RA Area:     5.62 cm LA Vol (A2C):   30.9 ml 18.16 ml/m  RA Volume:   7.84 ml  4.61 ml/m LA Vol (A4C):   37.0 ml 21.75 ml/m LA Biplane Vol: 34.5 ml 20.28 ml/m  AORTIC VALVE                    PULMONIC VALVE AV Area (Vmax):    1.79 cm     PV Vmax:       0.80 m/s AV Area (Vmean):   1.83 cm     PV Peak grad:  2.6 mmHg AV Area (VTI):     2.02 cm AV Vmax:           126.00 cm/s AV Vmean:          83.500 cm/s AV VTI:            0.246 m AV Peak Grad:      6.4 mmHg AV Mean Grad:      3.0 mmHg LVOT Vmax:         88.60 cm/s LVOT Vmean:        59.900 cm/s LVOT VTI:          0.195 m LVOT/AV VTI ratio: 0.79  AORTA Ao Root diam: 2.70 cm Ao Asc diam:  2.70 cm MITRAL VALVE MV Area (PHT): 4.46 cm    SHUNTS MV Area VTI:   2.77 cm    Systemic VTI:  0.20 m MV Peak grad:  3.4 mmHg    Systemic Diam: 1.80 cm MV Mean grad:  2.0 mmHg MV Vmax:       0.92 m/s MV Vmean:      58.6 cm/s MV Decel Time: 170 msec MV E velocity: 58.80 cm/s MV A velocity: 66.10 cm/s MV E/A ratio:  0.89 Weston Brass MD Electronically signed by Weston Brass MD Signature Date/Time: 06/11/2023/2:45:42 PM    Final (Updated)    CT ABDOMEN PELVIS W CONTRAST Result Date: 06/10/2023 CLINICAL DATA:  Non-Hodgkin's lymphoma.  Abdominal pain. * Tracking Code: BO * EXAM:  CT ABDOMEN AND PELVIS WITH CONTRAST TECHNIQUE: Multidetector CT imaging of the abdomen and pelvis was performed using the standard protocol following bolus administration of intravenous contrast. RADIATION DOSE REDUCTION: This exam was performed according to the departmental dose-optimization program which includes automated exposure control, adjustment  of the mA and/or kV according to Gabrielle Gabrielle [name] size and/or use of iterative reconstruction technique. CONTRAST:  OMNIPAQUE IOHEXOL 300 MG/ML  SOLN COMPARISON:  02/02/2023 FINDINGS: Lower chest: Bilateral breast implants. Stable scarring in both lower lobes and in the right middle lobe. Hepatobiliary: Stable 0.6 by 0.3 cm nonspecific lesion in segment 3 of the liver on image 21 series 2. No further imaging workup of this lesion is indicated. Gallbladder unremarkable. Pancreas: Unremarkable Spleen: Unremarkable Adrenals/Urinary Tract: Unremarkable Stomach/Bowel: Dilated loops of bowel with scattered air-fluid levels extending to a transition in the right abdomen shown on image 35 series 3 where there is suspected wall thickening in a bowel loop at least over a 4.5 cm segment, subsequent mildly angulated turning of the loop, and possible mild accentuated mucosal enhancement in several loops of small bowel distal to this process. Appearance concerning for early or partial small bowel obstruction associated with inflamed or thick-walled loops of small bowel. No pneumatosis although there is some scattered ascites along the involved loops of small bowel. No extraluminal gas. No portal venous gas. Prominence of stool in the distal sigmoid colon and rectum, with some potential wall thickening and accentuated mucosal enhancement in the proximal sigmoid colon for example on image 35 series 3. Vascular/Lymphatic: There is only minimal aortic atherosclerosis. Pathologic mesenteric adenopathy is observed, index node 1.5 cm in short axis on image 41 series 2, stable. Reproductive:  Unremarkable Other: Mild ascites along the dilated loops of small bowel. Musculoskeletal: Unremarkable IMPRESSION: 1. Dilated loops of small bowel with scattered air-fluid levels extending to a transition in the right abdomen where there is suspected wall thickening in a bowel loop at least over a 4.5 cm segment, subsequent mildly angulated turning of the loop, and possible mild accentuated mucosal enhancement in several loops of small bowel distal to this process. Appearance concerning for early or partial small bowel obstruction associated with inflamed or thick-walled loops of small bowel. No pneumatosis although there is some scattered ascites along the involved loops of small bowel. No extraluminal gas. No portal venous gas. 2. Prominence of stool in the distal sigmoid colon and rectum, with some potential wall thickening and accentuated mucosal enhancement in the proximal sigmoid colon. The potential for multifocal inflammation including the involved small bowel segments and proximal sigmoid colon raise the possibility of Crohn's disease. 3. Stable pathologic mesenteric adenopathy, compatible with Gabrielle Gabrielle [name]'s history of lymphoma. 4. Stable scarring in both lower lobes and in the right middle lobe. 5. Stable 0.6 by 0.3 cm nonspecific lesion in segment 3 of the liver. No further imaging workup of this lesion is indicated. 6. Minimal aortic atherosclerosis. Electronically Signed   By: Gaylyn Rong M.D.   On: 06/10/2023 16:44   DG Chest Port 1 View Result Date: 06/10/2023 CLINICAL DATA:  Upper abdominal pain, non-Hodgkin's lymphoma EXAM: PORTABLE CHEST 1 VIEW COMPARISON:  Overlapping portions CT abdomen 02/02/2023 FINDINGS: Scarring in both lung bases. Cardiac and mediastinal margins appear normal. No blunting of the costophrenic angles. No significant osseous abnormalities observed. IMPRESSION: 1. Scarring in both lung bases. No acute findings. Electronically Signed   By: Gaylyn Rong M.D.   On:  06/10/2023 16:33    Microbiology: Results for orders placed or performed in visit on 08/18/21  Urine Culture     Status: None   Collection Time: 08/18/21 11:24 AM   Specimen: Urine  Result Value Ref Range Status   MICRO NUMBER: [id number]  Final   SPECIMEN QUALITY: Adequate  Final  Sample Source NOT GIVEN  Final   STATUS: FINAL  Final   Result: No Growth  Final    Labs: CBC: Recent Labs  Lab 06/10/23 1427 06/11/23 0551 06/12/23 0532 06/13/23 0505  WBC 10.1 8.9 7.0 6.4  NEUTROABS 8.5*  --   --   --   HGB 16.0* 12.7 12.0 12.1  HCT 46.4* 39.5 38.5 38.2  MCV 96.3 100.8* 102.9* 100.8*  PLT 211 158 153 166   Basic Metabolic Panel: Recent Labs  Lab 06/10/23 1427 06/11/23 0551 06/12/23 0532 06/13/23 0505  NA 137 138 139 141  K 4.2 4.4 3.9 4.1  CL 101 105 105 107  CO2 23 25 26 29   GLUCOSE 128* 113* 99 118*  BUN 16 18 13 8   CREATININE 0.72 0.72 0.70 0.69  CALCIUM 9.6 8.2* 8.5* 8.5*  MG  --   --   --  2.3   Liver Function Tests: Recent Labs  Lab 06/10/23 1427  AST 37  ALT 33  ALKPHOS 124  BILITOT 1.1  PROT 7.3  ALBUMIN 4.8   CBG: No results for input(s): "GLUCAP" in the last 168 hours.  Discharge time spent: greater than 30 minutes.  Signed: Tyrone Nine, MD [facility] 06/13/2023

## 2023-06-14 ENCOUNTER — Other Ambulatory Visit (HOSPITAL_BASED_OUTPATIENT_CLINIC_OR_DEPARTMENT_OTHER): Payer: Medicare HMO

## 2023-06-14 ENCOUNTER — Other Ambulatory Visit: Payer: Medicare HMO

## 2023-06-14 ENCOUNTER — Telehealth: Payer: Self-pay | Admitting: Internal Medicine

## 2023-06-14 NOTE — Telephone Encounter (Signed)
 Pt husband Ed  stated that pt was just discharged from the hospital and was recommended to follow up with Dr. Leone Payor. Pt was scheduled for an office visit on 07/25/2023 at 9:10 AM to see Dr. Leone Payor. Ed made aware. Ed verbalized understanding with all questions answered.

## 2023-06-14 NOTE — Telephone Encounter (Signed)
 Inbound call from patients husband stating that patient was hospitalized at [facility] for a small bowel obstruction and was advised to follow up with Dr. Leone Payor. I advised husband that Dr. Leone Payor next available was in April. Patients husband is requesting a call to discuss if there is anyway she can be seen sooner with Dr. Leone Payor. Please advise.

## 2023-06-16 ENCOUNTER — Telehealth: Payer: Self-pay | Admitting: Internal Medicine

## 2023-06-16 NOTE — Telephone Encounter (Signed)
 She was admitted at [facility] w/ SBO and some possible colon abnormalities on CT scan  She needs f/u  Please contact husband Ed and see if she can come 1130 3/4 or 350 3/6

## 2023-06-16 NOTE — Telephone Encounter (Signed)
 Left message for patient's husband to call back.

## 2023-06-17 NOTE — Telephone Encounter (Signed)
 Rescheduled OV with patient's husband for 3/6 at 3:50 pm.

## 2023-06-23 ENCOUNTER — Other Ambulatory Visit: Payer: Medicare HMO | Admitting: Oncology

## 2023-06-29 ENCOUNTER — Telehealth: Payer: Self-pay

## 2023-06-29 NOTE — Telephone Encounter (Signed)
 Due to a scheduling conflict with the provider, pt office visit with Dr. Leone Payor was rescheduled from 06/30/2023 to 07/05/2023 at 11:30 AM. Pt made aware.  Pt verbalized understanding with all questions answered.

## 2023-06-30 ENCOUNTER — Ambulatory Visit: Payer: Medicare HMO | Admitting: Internal Medicine

## 2023-06-30 DIAGNOSIS — H524 Presbyopia: Secondary | ICD-10-CM | POA: Diagnosis not present

## 2023-07-05 ENCOUNTER — Ambulatory Visit: Admitting: Internal Medicine

## 2023-07-05 ENCOUNTER — Encounter: Payer: Self-pay | Admitting: Internal Medicine

## 2023-07-05 VITALS — BP 100/68 | HR 65 | Ht 64.0 in | Wt 140.1 lb

## 2023-07-05 DIAGNOSIS — R933 Abnormal findings on diagnostic imaging of other parts of digestive tract: Secondary | ICD-10-CM | POA: Diagnosis not present

## 2023-07-05 DIAGNOSIS — C8580 Other specified types of non-Hodgkin lymphoma, unspecified site: Secondary | ICD-10-CM | POA: Diagnosis not present

## 2023-07-05 DIAGNOSIS — K56609 Unspecified intestinal obstruction, unspecified as to partial versus complete obstruction: Secondary | ICD-10-CM | POA: Diagnosis not present

## 2023-07-05 MED ORDER — SUFLAVE 178.7 G PO SOLR: 1.0000 | Freq: Once | ORAL | 0 refills | Status: AC

## 2023-07-05 NOTE — Patient Instructions (Addendum)
 We have sent the following medications to your pharmacy for you to pick up at your convenience: Sluflave  You have been scheduled for a colonoscopy. Please follow written instructions given to you at your visit today.   If you use inhalers (even only as needed), please bring them with you on the day of your procedure.  DO NOT TAKE 7 DAYS PRIOR TO TEST- Trulicity (dulaglutide) Ozempic, Wegovy (semaglutide) Mounjaro (tirzepatide) Bydureon Bcise (exanatide extended release)  DO NOT TAKE 1 DAY PRIOR TO YOUR TEST Rybelsus (semaglutide) Adlyxin (lixisenatide) Victoza (liraglutide) Byetta (exanatide) ___________________________________________________________________________  I appreciate the opportunity to care for you.   Dr. Stan Head

## 2023-07-05 NOTE — Progress Notes (Signed)
 Gabrielle [name] 62 y.o. 03/23/1961 [id number]  Assessment & Plan:   Encounter Diagnoses  Name Primary?   Abnormal CT scan, colon Yes   SBO (small bowel obstruction) (HCC)    Diffuse non-Hodgkin's lymphoma (HCC)     Colonoscopy to evaluate abnormal colon CT scan findings.  I doubt she has Crohn's disease I suspect this is artifactual.  However we will be diligent and evaluate especially since she has not had a colonoscopy.  Will check with Dr. Truett Perna about any other oncology follow-up sooner than planned given the small bowel obstruction in her non-Hodgkin's lymphoma.  I suspect it was related to adhesions, there was some lymphadenopathy in the scan but it was stable.  CC: Kirstie Peri, MD   Subjective:   Chief Complaint: Follow-up after small bowel obstruction  HPI 63 year old woman with a history of small bowel non-Hodgkin's lymphoma status post resection a number of years ago, chronic constipation and recent admission for partial small bowel obstruction at [facility].  She awakened with lower abdominal pain and presented to the hospital and CT demonstrated dilated loops of bowel with scattered air-fluid levels with a transition in the right abdomen and wall thickening in about a 4.5 cm segment of bowel proximal to a transition point.  There was a prominence of stool in the distal sigmoid colon and some potential wall thickening and accentuated mucosal enhancement in the proximal sigmoid colon.  Stable mesenteric adenopathy.  She was treated conservatively and recovered and discharged.  She says Milk of Magnesia every other day is producing a bowel movement every other day and her chronic constipation is not an issue like it was in the past.  She was to have a screening colonoscopy last year but it was not done.    Echocardiogram while hospitalized in February with normal ejection fraction and no significant abnormalities.   Lab Results  Component Value Date   WBC 6.4  06/13/2023   HGB 12.1 06/13/2023   HCT 38.2 06/13/2023   MCV 100.8 (H) 06/13/2023   PLT 166 06/13/2023    Allergies  Allergen Reactions   Cephalosporins Anaphylaxis   Rocephin [Ceftriaxone] Anaphylaxis, Hives and Shortness Of Breath    Facial numbness   Sulfa Antibiotics Itching and Rash   Current Meds  Medication Sig   ALPRAZolam (XANAX) 0.5 MG tablet Take 1 tablet (0.5 mg total) by mouth 3 (three) times daily. (Patient taking differently: Take 0.5 mg by mouth at bedtime as needed for sleep.)   aspirin EC 81 MG tablet Take 81 mg by mouth daily.   atorvastatin (LIPITOR) 80 MG tablet TAKE 1 TABLET EVERY DAY   ezetimibe (ZETIA) 10 MG tablet Take 1 tablet (10 mg total) by mouth daily.   lamoTRIgine (LAMICTAL) 200 MG tablet Take 200 mg by mouth daily.   Magnesium Hydroxide (MILK OF MAGNESIA PO) Take 15 mLs by mouth every other day.   metoprolol succinate (TOPROL-XL) 25 MG 24 hr tablet TAKE 1/2 TABLET ONE TIME DAILY   Multiple Vitamins-Minerals (MULTIVITAMIN ADULT PO) Take by mouth daily.   naproxen sodium (ALEVE) 220 MG tablet Take 220 mg by mouth daily as needed (back pain).   nitroGLYCERIN (NITROSTAT) 0.4 MG SL tablet Place 0.4 mg under the tongue every 5 (five) minutes as needed for chest pain.   PARoxetine (PAXIL) 40 MG tablet Take 1 tablet (40 mg total) by mouth daily.   Past Medical History:  Diagnosis Date   Anxiety and depression    CAD (coronary  artery disease)    MI + stent 2018   CHF (congestive heart failure) (HCC) 01/12/2023   ef 25% to 30%   Colon cancer screening 04/2019   Cologuard NEG.  Repeat 04/2022   Diffuse large B cell lymphoma (HCC) 2018   NHL-L peritoneal mesenteric mass-->no evidence of aggressive lymphoma as of 02/2021 hem/onc f/u   Dyspepsia    GERD (gastroesophageal reflux disease)    History of myocardial infarction 2018   Hx of migraines    remote past   Hypercholesterolemia    NHL (non-Hodgkin's lymphoma) (HCC) 2016   left peritoneal mesonteric  mass   Palpitations 2015   no monitoring was done-->resolved spontaneously   Past Surgical History:  Procedure Laterality Date   biopsy  2016   Initial bx needle; then lab abd (done in [facility]) done to get further bx, exp lap--complications--sepsis/hemorrhage--memory/cognitive difficulties since that surgery/hospitalization--details somewhat cloudy from pt's info.   BREAST ENHANCEMENT SURGERY  2003   Heart Stent  2018   Prox LAD ([place]).   INCISIONAL HERNIA REPAIR  2017   w/mesh   small bowel obstruction     TONSILLECTOMY     TRANSTHORACIC ECHOCARDIOGRAM  09/2013   2015 ALL NORMAL.  2020 normal per pt report (Drr. Adella Hare with [facility] [place]   WISDOM TOOTH EXTRACTION     Social History   Social History Narrative   Married to Ed, no children.   Relocated from [place] about 10/2018 returned to [place] 2022 approx   Educ: [employer], grad w/honors.   [employer] and others prior to disability.   Disabled due to bipolar, depression 2012.   No tob, alc.   No drug use.   family history includes CAD in her maternal aunt; Colon cancer in an other family member; Depression in her brother, maternal aunt, and mother; Emphysema in her mother; Heart disease in her mother; Heart failure in her mother; Hypertension in her brother and mother; Stroke in her maternal aunt and mother.   Review of Systems As per HPI  Objective:   Physical Exam @BP  100/68   Pulse 65   Ht 5\' 4"  (1.626 m)   Wt 140 lb 2 oz (63.6 kg)   LMP 10/20/2011   BMI 24.05 kg/m @  General:  NAD Eyes:   anicteric Lungs:  clear Heart::  S1S2 no rubs, murmurs or gallops Abdomen:  soft and nontender, BS+ well-healed surgical scars. Ext:   no edema, cyanosis or clubbing    Data Reviewed:  Please see the HPI

## 2023-07-25 ENCOUNTER — Ambulatory Visit: Payer: Medicare HMO | Admitting: Internal Medicine

## 2023-07-29 DIAGNOSIS — Z299 Encounter for prophylactic measures, unspecified: Secondary | ICD-10-CM | POA: Diagnosis not present

## 2023-07-29 DIAGNOSIS — F319 Bipolar disorder, unspecified: Secondary | ICD-10-CM | POA: Diagnosis not present

## 2023-07-29 DIAGNOSIS — I10 Essential (primary) hypertension: Secondary | ICD-10-CM | POA: Diagnosis not present

## 2023-08-10 ENCOUNTER — Encounter: Admitting: Internal Medicine

## 2023-09-08 DIAGNOSIS — H25813 Combined forms of age-related cataract, bilateral: Secondary | ICD-10-CM | POA: Diagnosis not present

## 2023-10-10 ENCOUNTER — Encounter (HOSPITAL_COMMUNITY): Payer: Self-pay

## 2023-10-10 ENCOUNTER — Emergency Department (HOSPITAL_COMMUNITY)

## 2023-10-10 ENCOUNTER — Telehealth: Payer: Self-pay | Admitting: Internal Medicine

## 2023-10-10 ENCOUNTER — Encounter: Payer: Self-pay | Admitting: Oncology

## 2023-10-10 ENCOUNTER — Other Ambulatory Visit: Payer: Self-pay

## 2023-10-10 ENCOUNTER — Emergency Department (HOSPITAL_COMMUNITY)
Admission: EM | Admit: 2023-10-10 | Discharge: 2023-10-10 | Disposition: A | Discharge status: Home / Self Care | Attending: Emergency Medicine | Admitting: Emergency Medicine

## 2023-10-10 DIAGNOSIS — Z7982 Long term (current) use of aspirin: Secondary | ICD-10-CM | POA: Insufficient documentation

## 2023-10-10 DIAGNOSIS — Z8572 Personal history of non-Hodgkin lymphomas: Secondary | ICD-10-CM | POA: Insufficient documentation

## 2023-10-10 DIAGNOSIS — R109 Unspecified abdominal pain: Secondary | ICD-10-CM | POA: Diagnosis not present

## 2023-10-10 DIAGNOSIS — R590 Localized enlarged lymph nodes: Secondary | ICD-10-CM | POA: Diagnosis not present

## 2023-10-10 LAB — URINALYSIS, ROUTINE W REFLEX MICROSCOPIC
Bilirubin Urine: NEGATIVE
Glucose, UA: NEGATIVE mg/dL
Hgb urine dipstick: NEGATIVE
Ketones, ur: 5 mg/dL — AB
Nitrite: NEGATIVE
Protein, ur: NEGATIVE mg/dL
Specific Gravity, Urine: 1.023 (ref 1.005–1.030)
pH: 6 (ref 5.0–8.0)

## 2023-10-10 LAB — COMPREHENSIVE METABOLIC PANEL WITH GFR
ALT: 24 U/L (ref 0–44)
AST: 27 U/L (ref 15–41)
Albumin: 4.3 g/dL (ref 3.5–5.0)
Alkaline Phosphatase: 105 U/L (ref 38–126)
Anion gap: 8 (ref 5–15)
BUN: 24 mg/dL — ABNORMAL HIGH (ref 8–23)
CO2: 26 mmol/L (ref 22–32)
Calcium: 9.1 mg/dL (ref 8.9–10.3)
Chloride: 105 mmol/L (ref 98–111)
Creatinine, Ser: 0.81 mg/dL (ref 0.44–1.00)
GFR, Estimated: >60 mL/min (ref >60)
Glucose, Bld: 131 mg/dL — ABNORMAL HIGH (ref 70–99)
Potassium: 4 mmol/L (ref 3.5–5.1)
Sodium: 139 mmol/L (ref 135–145)
Total Bilirubin: 1.1 mg/dL (ref 0.0–1.2)
Total Protein: 6.8 g/dL (ref 6.5–8.1)

## 2023-10-10 LAB — LIPASE, BLOOD: Lipase: 31 U/L (ref 11–51)

## 2023-10-10 LAB — CBC
HCT: 42.7 % (ref 36.0–46.0)
Hemoglobin: 14.1 g/dL (ref 12.0–15.0)
MCH: 33.1 pg (ref 26.0–34.0)
MCHC: 33 g/dL (ref 30.0–36.0)
MCV: 100.2 fL — ABNORMAL HIGH (ref 80.0–100.0)
Platelets: 197 10*3/uL (ref 150–400)
RBC: 4.26 MIL/uL (ref 3.87–5.11)
RDW: 12.6 % (ref 11.5–15.5)
WBC: 7 10*3/uL (ref 4.0–10.5)
nRBC: 0 % (ref 0.0–0.2)

## 2023-10-10 LAB — CBG MONITORING, ED: Glucose-Capillary: 83 mg/dL (ref 70–99)

## 2023-10-10 MED ORDER — IOHEXOL 300 MG/ML  SOLN
100.0000 mL | Freq: Once | INTRAMUSCULAR | Status: AC | PRN
  Administered 2023-10-10: 100 mL via INTRAVENOUS

## 2023-10-10 MED ORDER — KETOROLAC TROMETHAMINE 30 MG/ML IJ SOLN
30.0000 mg | Freq: Once | INTRAMUSCULAR | Status: AC
  Filled 2023-10-10: qty 1

## 2023-10-10 NOTE — Telephone Encounter (Signed)
 Patient with a history of partial SBO in February of this year.  Admitted at [facility].  Also has a history of lymphoma in the abdomen.  That has been stable, we think.  She went about a week without defecation took milk of magnesia with some loose stools but that has not helped sharp periumbilical abdominal pain and she is very tender and weak.  Also pain noted over to the right.  No nausea vomiting but she has been dizzy and lightheaded.  She says her abdomen is tight and distended.  No fever.  I have advised that she go to the emergency department for evaluation and she will proceed to [facility] emergency department.

## 2023-10-10 NOTE — Discharge Instructions (Addendum)
 Please follow-up with your oncologist.  Your CT scan shows that you have some inflamed lymph nodes in your abdomen which could be the source of your pain.  You can take ibuprofen 600 mg 3 times a day, with food, to help with the pain.

## 2023-10-10 NOTE — ED Triage Notes (Signed)
 Patient has had abdominal cramping for 3 days that has worsened. Last full bowel movement was 1 week ago. Has diarrhea now because she took a laxative. No vomiting.

## 2023-10-10 NOTE — ED Notes (Signed)
 Pt given juice and cracker and peanut butter.

## 2023-10-10 NOTE — ED Provider Notes (Signed)
 [place] EMERGENCY DEPARTMENT AT [facility] Provider Note   CSN: [id number] Arrival date & time: 10/10/23  1345     Patient presents with: Abdominal Cramping   Gabrielle [name] is a 63 y.o. female w/ hx of SBO, B cell lymphoma, HLD, reflux, abdominal hernia repair, presenting to ED with abdominal pain. Reports chronic constipation, took milk of magnesia 3 days ago and having diarrhea and passing gas, no vomiting, but diffuse abdominal pain for 3 days worsening.  Hx of SBO most recently hospitalized Feb 2025 per my review of external records   HPI     Prior to Admission medications   Medication Sig Start Date End Date Taking? Authorizing Provider  ALPRAZolam  (XANAX ) 0.5 MG tablet Take 1 tablet (0.5 mg total) by mouth 3 (three) times daily. Patient taking differently: Take 0.5 mg by mouth at bedtime as needed for sleep. 06/19/21   McGowenMinetta Aly, MD  aspirin  EC 81 MG tablet Take 81 mg by mouth daily. 03/12/19   [provider]  atorvastatin  (LIPITOR) 80 MG tablet TAKE 1 TABLET EVERY DAY 12/02/21   McGowen, Minetta Aly, MD  ezetimibe  (ZETIA ) 10 MG tablet Take 1 tablet (10 mg total) by mouth daily. 06/19/21   McGowen, Minetta Aly, MD  lamoTRIgine  (LAMICTAL ) 200 MG tablet Take 200 mg by mouth daily. 03/12/19   [provider]  Magnesium  Hydroxide (MILK OF MAGNESIA PO) Take 15 mLs by mouth every other day.    [provider]  metoprolol  succinate (TOPROL -XL) 25 MG 24 hr tablet TAKE 1/2 TABLET ONE TIME DAILY 06/19/21   McGowen, Minetta Aly, MD  Multiple Vitamins-Minerals (MULTIVITAMIN ADULT PO) Take by mouth daily.    [provider]  naproxen sodium (ALEVE) 220 MG tablet Take 220 mg by mouth daily as needed (back pain).    [provider]  nitroGLYCERIN (NITROSTAT) 0.4 MG SL tablet Place 0.4 mg under the tongue every 5 (five) minutes as needed for chest pain. 05/25/21   [provider]  PARoxetine  (PAXIL ) 40 MG tablet Take 1 tablet (40 mg  total) by mouth daily. 12/04/21   McGowen, Minetta Aly, MD    Allergies: Cephalosporins, Rocephin  [ceftriaxone ], and Sulfa antibiotics    Review of Systems  Updated Vital Signs BP 117/88   Pulse 74   Temp 98.7 F (37.1 C) (Oral)   Resp 18   Ht 5' 4 (1.626 m)   Wt 63.5 kg   LMP 10/20/2011   SpO2 98%   BMI 24.03 kg/m   Physical Exam Constitutional:      General: She is not in acute distress. HENT:     Head: Normocephalic and atraumatic.   Eyes:     Conjunctiva/sclera: Conjunctivae normal.     Pupils: Pupils are equal, round, and reactive to light.    Cardiovascular:     Rate and Rhythm: Normal rate and regular rhythm.  Pulmonary:     Effort: Pulmonary effort is normal. No respiratory distress.  Abdominal:     General: There is no distension.     Tenderness: There is abdominal tenderness.   Skin:    General: Skin is warm and dry.   Neurological:     General: No focal deficit present.     Mental Status: She is alert. Mental status is at baseline.   Psychiatric:        Mood and Affect: Mood normal.        Behavior: Behavior normal.     (all labs  ordered are listed, but only abnormal results are displayed) Labs Reviewed  COMPREHENSIVE METABOLIC PANEL WITH GFR - Abnormal; Notable for the following components:      Result Value   Glucose, Bld 131 (*)    BUN 24 (*)    All other components within normal limits  CBC - Abnormal; Notable for the following components:   MCV 100.2 (*)    All other components within normal limits  URINALYSIS, ROUTINE W REFLEX MICROSCOPIC - Abnormal; Notable for the following components:   APPearance HAZY (*)    Ketones, ur 5 (*)    Leukocytes,Ua TRACE (*)    Bacteria, UA RARE (*)    All other components within normal limits  LIPASE, BLOOD  CBG MONITORING, ED    EKG: None  Radiology: CT ABDOMEN PELVIS W CONTRAST Result Date: 10/10/2023 CLINICAL DATA:  Abdomen cramping diarrhea EXAM: CT ABDOMEN AND PELVIS WITH CONTRAST  TECHNIQUE: Multidetector CT imaging of the abdomen and pelvis was performed using the standard protocol following bolus administration of intravenous contrast. RADIATION DOSE REDUCTION: This exam was performed according to the departmental dose-optimization program which includes automated exposure control, adjustment of the mA and/or kV according to patient size and/or use of iterative reconstruction technique. CONTRAST:  OMNIPAQUE  IOHEXOL  300 MG/ML  SOLN COMPARISON:  CT 06/10/2023 FINDINGS: Lower chest: Lung bases demonstrate atelectasis or scarring at the bases. Bilateral breast implants. Hepatobiliary: Subcentimeter hypodensity in the left hepatic lobe too small to further characterize. No calcified gallstone or biliary dilatation Pancreas: Unremarkable. No pancreatic ductal dilatation or surrounding inflammatory changes. Spleen: Normal in size without focal abnormality. Adrenals/Urinary Tract: Adrenal glands are normal. Kidneys show no hydronephrosis. The bladder is unremarkable Stomach/Bowel: The stomach is nonenlarged. There is no dilated small bowel. No acute bowel wall thickening. Negative appendix Vascular/Lymphatic: Mild atherosclerosis. No aneurysm. Multiple enlarged mesenteric nodes measuring up to 1.4 cm. Reproductive: Uterus and bilateral adnexa are unremarkable. Other: Negative for pelvic effusion or free air Musculoskeletal: No acute or suspicious osseous abnormality. IMPRESSION: 1. Negative for bowel obstruction or acute bowel wall thickening. 2. Multiple enlarged mesenteric nodes, stable, measuring up to 1.4 cm, corresponding to history of lymphoma Electronically Signed   By: Esmeralda Hedge M.D.   On: 10/10/2023 17:59     Procedures   Medications Ordered in the ED  ketorolac (TORADOL) 30 MG/ML injection 30 mg (has no administration in time range)  iohexol  (OMNIPAQUE ) 300 MG/ML solution 100 mL (100 mLs Intravenous Contrast Given 10/10/23 1734)                                     Medical Decision Making Amount and/or Complexity of Data Reviewed Labs: ordered. Radiology: ordered.  Risk Prescription drug management.   This patient presents to the ED with concern for abdominal pain. This involves an extensive number of treatment options, and is a complaint that carries with it a high risk of complications and morbidity.  The differential diagnosis includes SBO vs hernia pain vs UTI vs pancreatitis vs other  Co-morbidities that complicate the patient evaluation: hx of abd surgeries at risk of adhesions/SBO  Additional history obtained from husband  External records from outside source obtained and reviewed including hospital discharge summary Feb 2025  I ordered and personally interpreted labs.  The pertinent results include:  no emergent findings  I ordered imaging studies including CT abdomen pelvis with contrast I independently visualized and interpreted  imaging which showed mesenteric lymphadenopathy, no other emergent findings I agree with the radiologist interpretation  I have reviewed the patients home medicines and have made adjustments as needed  Test Considered: doubt AAA, mesenteric ischemia; no indication for angiogram at this time   After the interventions noted above, I reevaluated the patient and found that they have: improved    Disposition:  After consideration of the diagnostic results and the patients response to treatment, I feel that the patent would benefit from outpatient follow-up.      Final diagnoses:  Mesenteric lymphadenopathy    ED Discharge Orders     None          Arvilla Birmingham, MD 10/10/23 1901

## 2023-10-11 ENCOUNTER — Telehealth: Payer: Self-pay | Admitting: Internal Medicine

## 2023-10-11 ENCOUNTER — Telehealth: Payer: Self-pay | Admitting: *Deleted

## 2023-10-11 NOTE — Telephone Encounter (Signed)
 Informed Gabrielle [name] that Dr. Scherrie Curt reviewed her CT report and wants to see her on 6/25 at 11:15. Appointment scheduled and patient is aware.

## 2023-10-11 NOTE — Telephone Encounter (Addendum)
 Patient with recent episode of abdominal pain and constipation.  CT scanning did not show any new problems.  Has an appointment for August.  Please try to find an earlier spot for her.  We can use an open nurse visit or perhaps a banding appointment or one of the 7-day hold spots.

## 2023-10-12 NOTE — Telephone Encounter (Signed)
 I have made her a 10/21/2023 appointment and she is aware of date/time. I left the August one on the books for now.

## 2023-10-14 ENCOUNTER — Other Ambulatory Visit: Admitting: Oncology

## 2023-10-19 ENCOUNTER — Inpatient Hospital Stay: Attending: Nurse Practitioner | Admitting: Nurse Practitioner

## 2023-10-19 ENCOUNTER — Encounter: Payer: Self-pay | Admitting: Nurse Practitioner

## 2023-10-19 VITALS — BP 111/55 | HR 62 | Temp 98.0°F | Resp 15 | Ht 64.0 in | Wt 136.2 lb

## 2023-10-19 DIAGNOSIS — C8580 Other specified types of non-Hodgkin lymphoma, unspecified site: Secondary | ICD-10-CM

## 2023-10-19 DIAGNOSIS — C8290 Follicular lymphoma, unspecified, unspecified site: Secondary | ICD-10-CM | POA: Diagnosis not present

## 2023-10-19 NOTE — Progress Notes (Signed)
 [facility] OFFICE PROGRESS NOTE   Diagnosis: Non-Hodgkin's lymphoma  INTERVAL HISTORY:   Gabrielle [name] returns prior to scheduled follow-up.  She was seen in the emergency department 10/10/2023 with abdominal pain.  CT abdomen/pelvis was negative for bowel obstruction or acute bowel wall thickening.  Multiple stable enlarged mesenteric nodes measuring up to 1.4 cm noted.  She is no longer having abdominal pain.  She continues to have problems with constipation.  She estimates last bowel movement was about 2 weeks ago.  No nausea or vomiting.  No fevers or sweats.  She feels her appetite has diminished a little bit over the past few months, thinks about 5 pounds of weight loss.  Objective:  Vital signs in last 24 hours:  Blood pressure (!) 111/55, pulse 62, temperature 98 F (36.7 C), temperature source Oral, resp. rate 15, height 5' 4 (1.626 m), weight 136 lb 3.2 oz (61.8 kg), last menstrual period 10/20/2011, SpO2 98%.     Lymphatics: No palpable cervical, supraclavicular, axillary or inguinal lymph nodes. Resp: Lungs clear bilaterally. Cardio: Regular rate and rhythm. GI: Firm fullness upper mid abdomen with associated tenderness.  No hepatosplenomegaly. Vascular: No leg edema.   Lab Results:  Lab Results  Component Value Date   WBC 7.0 10/10/2023   HGB 14.1 10/10/2023   HCT 42.7 10/10/2023   MCV 100.2 (H) 10/10/2023   PLT 197 10/10/2023   NEUTROABS 8.5 (H) 06/10/2023    Imaging:  No results found.  Medications: I have reviewed the patient's current medications.  Assessment/Plan: Non-Hodgkin lymphoma, B-cell follicular center cell type CT abdomen/pelvis 05/29/2014-left abdominal intraperitoneal mass, subcentimeter liver lesions felt to be benign, no adenopathy CT-guided biopsy of left abdominal mass 06/04/2014-non-Hodgkin B-cell lymphoma, follicular center cell type, high-grade favored, abundance of large lymphocytes admixed with small angulated  lymphocytes Relocated to [facility] , underwent an open diagnostic biopsy 07/16/2014-pathology from mesenteric lymph node biopsies revealed sclerosing follicular B-cell lymphoma, CD20 positive, CD10 positive, predominantly diffuse architecture, low-grade.  Flow cytometry revealed a monoclonal B-cell population, lambda light chain restricted, 12.9% large cells, c-Myc rearrangement not detected, KI-67 20% She was not treated for lymphoma  PET 12/12/2014-markedly decreased size of hypermetabolic mesenteric mass, measuring 2.3 x 3.8 cm compared to 4.8 x 7.9 cm with SUV 7.2 compared to 18.9 hypermetabolic activity in the region of the retroperitoneal reflection no longer evident, hypermetabolic soft tissue track extending from the bladder dome to the periumbilical region PET 07/31/2015-previously noted hypermetabolic mesenteric mass no longer visualized, no hypermetabolic lymphadenopathy or extranodal disease, midline subincisional fluid collection measuring 3 x 6 cm CT abdomen/pelvis 10/16/2020-compared to 05/29/2014 CT, no change in left lateral liver hypodensity, abnormal mesenteric adenopathy in the left abdomen at the site of a previous mesenteric mass, representative node measuring 15 mm, majority of nodes are 5-10 mm, no retroperitoneal or upper abdominal adenopathy, no pelvic sidewall or inguinal adenopathy CT abdomen/pelvis 09/28/2021-no change in mesenteric adenopathy, clearing of haziness in the mesentery, no new lymph nodes CT abdomen/pelvis 10/01/2022-no change in mesenteric adenopathy, prominent stool throughout the colon CT abdomen/pelvis 02/02/2023-stable mild mesenteric lymphadenopathy, new short segment of small bowel thickening in the right abdomen with adjacent stranding CT abdomen/pelvis 06/10/2023-dilated loops of small bowel with scattered air-fluid levels extending to a transition in the right abdomen where there is suspected wall thickening and a bowel loop at least over a 4.5 cm  segment.  Appearance concerning for early or partial small bowel obstruction associated with inflamed or thick walled loops of  small bowel.  Stable pathologic mesenteric adenopathy.  Stable scarring both lower lobes and in the right middle lobe. CT abdomen/pelvis 10/10/2023-negative for bowel obstruction or acute bowel wall thickening.  Multiple enlarged mesenteric nodes, stable, measuring up to 1.4 cm. 2.  Coronary artery disease 3.  Anxiety and depression 4.  Hyperlipidemia 5.  Anaphylactic reaction to ceftriaxone  08/18/2021  Disposition: Gabrielle [name] appears stable.  Recent CTs showed stable enlarged mesenteric nodes.  CT images reviewed with her and her husband at today's visit.  Etiology of the episode of abdominal pain is unclear.  She will follow-up with Dr. Avram as scheduled later this week.  She will return for an office visit in 3 months.  We are available to see her sooner if needed.  Patient seen with Dr. Cloretta.    Olam Ned ANP/GNP-BC   10/19/2023  11:35 AM  This was a shared visit with Olam Ned.  Gabrielle [name] was interviewed and examined.  We reviewed the October 2024, February 2025, and June 2025 CT images.  She has a history of follicular lymphoma.  The CTs do not reveal evidence of progressive lymphoma.  She has intermittent abdominal pain and was admitted with a partial small bowel obstruction in February.  The pain and obstructive symptoms are most likely related to adhesions, though another etiology is possible.  There is thickening of the small bowel/mesentery at the anterior abdominal wall.  This could represent an inflammatory process or less likely lymphoma.  She is scheduled to see Dr. Avram later this week.  I recommend a staging PET if Dr. Avram feels lymphoma may be responsible for her symptoms.  The intermittent nature of her symptoms argues against lymphoma.  I was present for greater than 50% of today's visit.  I performed Medical Decision Making.  Arvella Cloretta, MD

## 2023-10-21 ENCOUNTER — Ambulatory Visit: Admitting: Internal Medicine

## 2023-10-21 ENCOUNTER — Telehealth: Payer: Self-pay | Admitting: *Deleted

## 2023-10-21 ENCOUNTER — Encounter: Payer: Self-pay | Admitting: *Deleted

## 2023-10-21 ENCOUNTER — Encounter: Payer: Self-pay | Admitting: Internal Medicine

## 2023-10-21 ENCOUNTER — Other Ambulatory Visit: Payer: Self-pay | Admitting: Oncology

## 2023-10-21 VITALS — BP 128/70 | HR 68 | Ht 64.0 in | Wt 135.6 lb

## 2023-10-21 DIAGNOSIS — C8580 Other specified types of non-Hodgkin lymphoma, unspecified site: Secondary | ICD-10-CM

## 2023-10-21 DIAGNOSIS — R933 Abnormal findings on diagnostic imaging of other parts of digestive tract: Secondary | ICD-10-CM

## 2023-10-21 DIAGNOSIS — K5909 Other constipation: Secondary | ICD-10-CM | POA: Diagnosis not present

## 2023-10-21 DIAGNOSIS — K5641 Fecal impaction: Secondary | ICD-10-CM

## 2023-10-21 MED ORDER — NA SULFATE-K SULFATE-MG SULF 17.5-3.13-1.6 GM/177ML PO SOLN: 1.0000 | ORAL | 0 refills | Status: DC

## 2023-10-21 NOTE — Telephone Encounter (Signed)
-----   Message from Gabrielle [name] sent at 10/21/2023  7:30 AM EDT ----- Regarding: FW:  Please let her know I discussed the case with Dr. Avram.  We ordered a PET scan for restaging, schedule an office visit within 1 week after the PET ----- Message ----- From: Avram Lupita BRAVO, MD Sent: 10/21/2023   7:12 AM EDT To: Gabrielle KATHEE Hof, MD Subject: RE:                                            Maybe we should just do a PET and we will hopefully have a better idea? ----- Message ----- From: [name] Gabrielle KATHEE, MD Sent: 10/20/2023   8:26 AM EDT To: Lupita BRAVO Avram, MD  Thanks, I was more concerned about the mesenteric thickening after looking back at the original PET with mesenteric masses I will see her back in 3 months and sooner depending on your evaluation ----- Message ----- From: Avram Lupita BRAVO, MD Sent: 10/19/2023   6:14 PM EDT To: Gabrielle KATHEE Hof, MD  Yes I am familiar.  I read your note and you really do not think it is lymphoma so I will trust you.  She has not had a colonoscopy and we have been back-and-forth about that and she is fearful of it due to the sedation and concerned about complications.  So probably will not ask for a PET scan but will let you know after I see her. ----- Message ----- From: [name] Gabrielle KATHEE, MD Sent: 10/19/2023   5:32 PM EDT To: Lupita BRAVO Avram, MD  History of follicular lymphoma, untreated, intermittent episodes of abdominal pain.  Admitted with a partial small bowel obstruction in February  Had an episode of abdominal pain this month and was seen in the emergency room.  CT revealed no acute finding.  There is firmness in the mid/upper left abdomen on exam without a discrete mass.  There appears to be an area of mesenteric/small bowel thickening in the right upper abdomen.  I have a low suspicion for lymphoma, but we can consider a PET if you are concerned.  I suspect her symptoms are related to adhesions.  You were seen earlier this week, let me know  if you think we should schedule a PET or other diagnostic evaluation  Thanks,  Arvella

## 2023-10-21 NOTE — Progress Notes (Signed)
 Gabrielle [name] 63 y.o. 1960-05-18 [id number]  Assessment & Plan:   Encounter Diagnoses  Name Primary?   Chronic constipation Yes   Abnormal CT scan, colon    Diffuse non-Hodgkin's lymphoma (HCC)    Fecal impaction (HCC)    Treat fecal impaction and constipation with a MiraLAX purge and then daily milk of magnesia.  Patient to update me in 2 to 3 days  Schedule colonoscopy, use double prep  Follow-up PET scan ordered by Dr. Cloretta  The risks and benefits as well as alternatives of endoscopic procedure(s) have been discussed and reviewed. All questions answered. The patient agrees to proceed.  CC: Maree Isles, MD Dr. 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  She suffers with chronic constipation, and has not had a significant bowel movement in 2 weeks she says.  She does not get the urge to defecate.  She has been taking milk of magnesia regularly every other day and then daily.  Recently she contacted me because she was having increasing abdominal pain she was having diarrhea after increasing milk of magnesia for constipation and went to the emergency department with CT scan showing multiple enlarged mesenteric nodes that were stable measuring up to 1.4 cm.  Previously when she had had a small bowel obstruction and was at [facility] with an admission she had had a CT scan showing partial SBO and similar changes in the mesentery and some prominent stool in the distal sigmoid colon and rectum with some potential wall thickening and accentuated mucosal enhancement in the proximal sigmoid colon.  Radiologist raised the possibility of Crohn's disease but she has  really never had any chronic diarrhea or issues like that.  She has a chronic bloating ever since she had surgery for lymphoma and suffered a perforation.  She has been very reluctant to undergo any type of sedation or procedures for fear of a recurrent complication issue.  She has been scheduled for colonoscopy twice and she had prepped for 1 but that had to be canceled for reasons beyond her control.  I had seen her in March after the February episode and we scheduled a colonoscopy but she decided to cancel it.  She says she is willing to do 1 now.  Note also she says MiraLAX has not been helpful for constipation.  She has never been in any specific pharmacologic therapy like Linzess or Amitiza.  She does not report bleeding issues.  CT abdomen and pelvis with contrast 10/10/2023 IMPRESSION: 1. Negative for bowel obstruction or acute bowel wall thickening. 2. Multiple enlarged mesenteric nodes, stable, measuring up to 1.4 cm, corresponding to history of lymphoma  CT abdomen pelvis with contrast 06/10/2023 IMPRESSION: 1. Dilated loops of small bowel with scattered air-fluid levels extending to a transition in the right abdomen where there is suspected wall thickening in a bowel loop at least over a 4.5 cm segment, subsequent mildly angulated turning of the loop, and possible mild accentuated mucosal enhancement in several loops of small bowel distal to this process. Appearance concerning for early or partial small bowel obstruction associated with inflamed or thick-walled loops of small bowel. No pneumatosis although there is some scattered ascites along the involved loops of small bowel.  No extraluminal gas. No portal venous gas. 2. Prominence of stool in the distal sigmoid colon and rectum, with some potential wall thickening and accentuated mucosal enhancement in the proximal sigmoid colon. The potential for multifocal inflammation including the involved small bowel segments and proximal  sigmoid colon raise the possibility of Crohn's disease. 3. Stable pathologic mesenteric adenopathy, compatible with patient's history of lymphoma. 4. Stable scarring in both lower lobes and in the right middle lobe. 5. Stable 0.6 by 0.3 cm nonspecific lesion in segment 3 of the liver. No further imaging workup of this lesion is indicated. 6. Minimal aortic atherosclerosis.  Lab Results  Component Value Date   WBC 7.0 10/10/2023   HGB 14.1 10/10/2023   HCT 42.7 10/10/2023   MCV 100.2 (H) 10/10/2023   PLT 197 10/10/2023     Chemistry      Component Value Date/Time   NA 139 10/10/2023 1359   K 4.0 10/10/2023 1359   CL 105 10/10/2023 1359   CO2 26 10/10/2023 1359   BUN 24 (H) 10/10/2023 1359   CREATININE 0.81 10/10/2023 1359   CREATININE 0.94 03/21/2023 0751      Component Value Date/Time   CALCIUM  9.1 10/10/2023 1359   ALKPHOS 105 10/10/2023 1359   AST 27 10/10/2023 1359   AST 25 03/21/2023 0751   ALT 24 10/10/2023 1359   ALT 25 03/21/2023 0751   BILITOT 1.1 10/10/2023 1359   BILITOT 0.6 03/21/2023 0751         Allergies  Allergen Reactions   Cephalosporins Anaphylaxis   Rocephin  [Ceftriaxone ] Anaphylaxis, Hives and Shortness Of Breath    Facial numbness   Sulfa Antibiotics Itching and Rash   Current Meds  Medication Sig   ALPRAZolam  (XANAX ) 0.5 MG tablet Take 1 tablet (0.5 mg total) by mouth 3 (three) times daily. (Patient taking differently: Take 0.5 mg by mouth 2 (two) times daily as needed for anxiety.)   aspirin  EC 81 MG tablet Take 81 mg by mouth daily.   atorvastatin  (LIPITOR) 80 MG tablet TAKE 1 TABLET EVERY DAY   ezetimibe  (ZETIA ) 10 MG tablet Take 1 tablet (10 mg total) by mouth daily.   lamoTRIgine  (LAMICTAL ) 200 MG tablet Take 200 mg by mouth daily.   Magnesium  Hydroxide (MILK OF MAGNESIA PO) Take 15 mLs by mouth every other day.   metoprolol  succinate (TOPROL -XL) 25 MG 24 hr tablet TAKE 1/2 TABLET ONE TIME DAILY   Multiple Vitamins-Minerals  (MULTIVITAMIN ADULT PO) Take by mouth daily.   naproxen sodium (ALEVE) 220 MG tablet Take 220 mg by mouth daily as needed (back pain).   nitroGLYCERIN (NITROSTAT) 0.4 MG SL tablet Place 0.4 mg under the tongue every 5 (five) minutes as needed for chest pain.   PARoxetine  (PAXIL ) 40 MG tablet Take 1 tablet (40 mg total) by mouth daily.   Past Medical History:  Diagnosis Date   Anxiety and depression    CAD (coronary artery disease)    MI + stent 2018   CHF (congestive heart failure) (HCC) 01/12/2023   ef 25% to 30%   Colon cancer screening 04/2019   Cologuard NEG.  Repeat 04/2022   Diffuse large B cell lymphoma (HCC) 2018   NHL-L peritoneal mesenteric mass-->no evidence of aggressive lymphoma as of 02/2021 hem/onc f/u   Dyspepsia    GERD (gastroesophageal reflux disease)    History of myocardial infarction 2018   Hx of migraines    remote past   Hypercholesterolemia    NHL (non-Hodgkin's lymphoma) (  HCC) 2016   left peritoneal mesonteric mass   Palpitations 2015   no monitoring was done-->resolved spontaneously   Past Surgical History:  Procedure Laterality Date   biopsy  2016   Initial bx needle; then lab abd (done in [facility]) done to get further bx, exp lap--complications--sepsis/hemorrhage--memory/cognitive difficulties since that surgery/hospitalization--details somewhat cloudy from pt's info.   BREAST ENHANCEMENT SURGERY  2003   Heart Stent  2018   Prox LAD ([place]).   INCISIONAL HERNIA REPAIR  2017   w/mesh   small bowel obstruction     TONSILLECTOMY     TRANSTHORACIC ECHOCARDIOGRAM  09/2013   2015 ALL NORMAL.  2020 normal per pt report (Drr. Larinda with [facility] [place]   WISDOM TOOTH EXTRACTION     Social History   Social History Narrative   Married to Ed, no children.   Relocated from [place] about 10/2018 returned to [place] 2022 approx   Educ: BA [employer], grad w/honors.   [employer] and others prior to disability.   Disabled due to bipolar,  depression 2012.   No tob, alc.   No drug use.   family history includes CAD in her maternal aunt; Colon cancer in an other family member; Depression in her brother, maternal aunt, and mother; Emphysema in her mother; Heart disease in her mother; Heart failure in her mother; Hypertension in her brother and mother; Stroke in her maternal aunt and mother.   Review of Systems  As per HPI Objective:   Physical Exam BP 128/70   Pulse 68   Ht 5' 4 (1.626 m)   Wt 135 lb 9.6 oz (61.5 kg)   LMP 10/20/2011   BMI 23.28 kg/m  NAD Abd soft and NT BS + Female staff present Rectal - multiple balls hard stool Normal resting and voluntary anal tone, normal-looking anoderm, simulated defecation shows appropriate abdominal contraction relaxation and descent of the rectum.

## 2023-10-21 NOTE — Telephone Encounter (Incomplete)
 Gabrielle [name]

## 2023-10-21 NOTE — Patient Instructions (Signed)
 Dr Avram  recommends that you complete a bowel purge (to clean out your bowels). Please do the following: Purchase a bottle of Miralax over the counter as well as a box of 5 mg dulcolax tablets. Take 2  dulcolax tablets. Wait 1 hour. You will then drink 6-8 capfuls of Miralax mixed in an adequate amount of water/juice/gatorade (you may choose which of these liquids to drink) over the next 2-3 hours. You should expect results within 1 to 6 hours after completing the bowel purge.    Take a tablespoon of milk of magnesia daily.   Update Dr Avram on Monday please.   You have been scheduled for a colonoscopy. Please follow written instructions given to you at your visit today.   If you use inhalers (even only as needed), please bring them with you on the day of your procedure.  DO NOT TAKE 7 DAYS PRIOR TO TEST- Trulicity (dulaglutide) Ozempic, Wegovy (semaglutide) Mounjaro (tirzepatide) Bydureon Bcise (exanatide extended release)  DO NOT TAKE 1 DAY PRIOR TO YOUR TEST Rybelsus (semaglutide) Adlyxin (lixisenatide) Victoza (liraglutide) Byetta (exanatide) ___________________________________________________________________________   I appreciate the opportunity to care for you. Lupita Avram, MD, [facility]

## 2023-10-24 ENCOUNTER — Telehealth: Payer: Self-pay | Admitting: Internal Medicine

## 2023-10-24 NOTE — Telephone Encounter (Signed)
 Inbound call from patient, states she would like to follow up with Dr. Avram after her bowel purge. States everything is well and has worked.

## 2023-10-25 ENCOUNTER — Telehealth: Payer: Self-pay | Admitting: Nurse Practitioner

## 2023-10-25 NOTE — Telephone Encounter (Signed)
 Per [employer], scheduled PT and left a voicemail.

## 2023-10-25 NOTE — Telephone Encounter (Signed)
 PT called to reschedule appt on 7/16

## 2023-10-26 DIAGNOSIS — L821 Other seborrheic keratosis: Secondary | ICD-10-CM | POA: Diagnosis not present

## 2023-10-26 DIAGNOSIS — L720 Epidermal cyst: Secondary | ICD-10-CM | POA: Diagnosis not present

## 2023-10-27 ENCOUNTER — Inpatient Hospital Stay: Attending: Nurse Practitioner | Admitting: Dietician

## 2023-10-27 DIAGNOSIS — K5900 Constipation, unspecified: Secondary | ICD-10-CM | POA: Insufficient documentation

## 2023-10-27 DIAGNOSIS — C8290 Follicular lymphoma, unspecified, unspecified site: Secondary | ICD-10-CM | POA: Insufficient documentation

## 2023-10-27 NOTE — Progress Notes (Signed)
 Nutrition Assessment Reached out to patient at hoe telephone#.  Reason for Assessment: MST screen for weight loss.    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   She is followed by Dr. Cloretta for oncology and Dr. Avram for GI.  She reports she is eating much better. Yesterday she ate very good and went to [employer] (chicken and dumpling soup bake) She attributes her weight loss last month to being on a clear fluid diet for few days.  She's been trying eat more foods that are easier for her body to digest and  laying off red meat recently using beans  as protein source. Also powered protein shakes every other day (thinks plant based, not sure of brand).   She claims she a very good chewer and that she is using oat milk instead of cows milk due to tolerance.  She is back to eating regularly. Still issues with chronic constipation and sluggish bowels. She admits she doesn't hydrate with much water. Fluids: tea 8oz, Gingerale 16oz, water 16oz., Gatorade 16oz  Anthropometrics: 5# (3.5%) past 2 weeks  Height: 64' Weight: 135.6# UBW: 140# BMI: 23.28    NUTRITION DIAGNOSIS: Inadequate PO intake to meet increased nutrient needs, r/t cancer diagnosis and clear liquid diet for a few days   INTERVENTION:  Relayed that nutrition services are wrap around service provided at no charge and encouraged continued communication if experiencing continued weight loss or any nutritional impact symptoms (NIS). Educated on importance of adequate calorie and protein energy intake  with nutrient dense foods when possible to maintain weight/strength Encouraged adequate chewing or blended foods as more digestible nutrients with reduced risk for SBO. Review fluid goals and  alternative for improving hydration. Emailed Nutrition Tip sheet  for  constipation with contact information provided   MONITORING, EVALUATION, GOAL: weight, PO intake, Nutrition Impact Symptoms, labs Goal is weight maintenance  Next Visit: PRN at patient or provider request  Micheline Craven, RDN, LDN Registered Dietitian, [facility] Part Time Remote (Usual office hours: Tuesday-Thursday) Cell: [phone]

## 2023-10-31 DIAGNOSIS — Z299 Encounter for prophylactic measures, unspecified: Secondary | ICD-10-CM | POA: Diagnosis not present

## 2023-10-31 DIAGNOSIS — R52 Pain, unspecified: Secondary | ICD-10-CM | POA: Diagnosis not present

## 2023-10-31 DIAGNOSIS — I10 Essential (primary) hypertension: Secondary | ICD-10-CM | POA: Diagnosis not present

## 2023-10-31 DIAGNOSIS — N390 Urinary tract infection, site not specified: Secondary | ICD-10-CM | POA: Diagnosis not present

## 2023-10-31 DIAGNOSIS — F319 Bipolar disorder, unspecified: Secondary | ICD-10-CM | POA: Diagnosis not present

## 2023-10-31 DIAGNOSIS — C8590 Non-Hodgkin lymphoma, unspecified, unspecified site: Secondary | ICD-10-CM | POA: Diagnosis not present

## 2023-10-31 DIAGNOSIS — R569 Unspecified convulsions: Secondary | ICD-10-CM | POA: Diagnosis not present

## 2023-11-03 ENCOUNTER — Telehealth: Payer: Self-pay | Admitting: Oncology

## 2023-11-04 ENCOUNTER — Encounter (HOSPITAL_COMMUNITY)
Admission: RE | Admit: 2023-11-04 | Discharge: 2023-11-04 | Disposition: A | Discharge status: Home / Self Care | Source: Ambulatory Visit | Attending: Oncology | Admitting: Oncology

## 2023-11-04 DIAGNOSIS — C8580 Other specified types of non-Hodgkin lymphoma, unspecified site: Secondary | ICD-10-CM | POA: Diagnosis not present

## 2023-11-04 DIAGNOSIS — C8593 Non-Hodgkin lymphoma, unspecified, intra-abdominal lymph nodes: Secondary | ICD-10-CM | POA: Diagnosis not present

## 2023-11-04 LAB — GLUCOSE, CAPILLARY: Glucose-Capillary: 107 mg/dL — ABNORMAL HIGH (ref 70–99)

## 2023-11-04 MED ORDER — FLUDEOXYGLUCOSE F - 18 (FDG) INJECTION
6.7300 | Freq: Once | INTRAVENOUS | Status: AC
Start: 2023-11-04 — End: 2023-11-04
  Administered 2023-11-04: 6.73 via INTRAVENOUS

## 2023-11-08 ENCOUNTER — Other Ambulatory Visit: Admitting: Oncology

## 2023-11-09 ENCOUNTER — Other Ambulatory Visit: Admitting: Nurse Practitioner

## 2023-11-09 ENCOUNTER — Inpatient Hospital Stay: Admitting: Nurse Practitioner

## 2023-11-09 ENCOUNTER — Encounter: Payer: Self-pay | Admitting: Nurse Practitioner

## 2023-11-09 VITALS — BP 107/69 | HR 70 | Temp 98.0°F | Resp 16 | Ht 64.0 in | Wt 137.2 lb

## 2023-11-09 DIAGNOSIS — C8290 Follicular lymphoma, unspecified, unspecified site: Secondary | ICD-10-CM | POA: Diagnosis not present

## 2023-11-09 DIAGNOSIS — C8580 Other specified types of non-Hodgkin lymphoma, unspecified site: Secondary | ICD-10-CM

## 2023-11-09 DIAGNOSIS — K5900 Constipation, unspecified: Secondary | ICD-10-CM | POA: Diagnosis not present

## 2023-11-09 NOTE — Progress Notes (Signed)
 [facility] OFFICE PROGRESS NOTE   Diagnosis: Non-Hodgkin's lymphoma  INTERVAL HISTORY:   Gabrielle [name] returns as scheduled to review the recent PET scan.  Appetite is improving.  No nausea or vomiting.  No fevers or sweats.  She has intermittent abdominal pain which she relates to constipation.  She reports a colonoscopy is scheduled in August.  Objective:  Vital signs in last 24 hours:  Blood pressure 107/69, pulse 70, temperature 98 F (36.7 C), temperature source Temporal, resp. rate 16, height 5' 4 (1.626 m), weight 137 lb 3.2 oz (62.2 kg), last menstrual period 10/20/2011, SpO2 100%.     Resp: Lungs clear bilaterally. Cardio: Regular rate and rhythm. GI: Mild tenderness over the upper abdomen.  No hepatosplenomegaly. Vascular: No leg edema.  Lab Results:  Lab Results  Component Value Date   WBC 7.0 10/10/2023   HGB 14.1 10/10/2023   HCT 42.7 10/10/2023   MCV 100.2 (H) 10/10/2023   PLT 197 10/10/2023   NEUTROABS 8.5 (H) 06/10/2023    Imaging:  No results found.  Medications: I have reviewed the patient's current medications.  Assessment/Plan: Non-Hodgkin lymphoma, B-cell follicular center cell type CT abdomen/pelvis 05/29/2014-left abdominal intraperitoneal mass, subcentimeter liver lesions felt to be benign, no adenopathy CT-guided biopsy of left abdominal mass 06/04/2014-non-Hodgkin B-cell lymphoma, follicular center cell type, high-grade favored, abundance of large lymphocytes admixed with small angulated lymphocytes Relocated to [facility] , underwent an open diagnostic biopsy 07/16/2014-pathology from mesenteric lymph node biopsies revealed sclerosing follicular B-cell lymphoma, CD20 positive, CD10 positive, predominantly diffuse architecture, low-grade.  Flow cytometry revealed a monoclonal B-cell population, lambda light chain restricted, 12.9% large cells, c-Myc rearrangement not detected, KI-67 20% She was not treated for  lymphoma  PET 12/12/2014-markedly decreased size of hypermetabolic mesenteric mass, measuring 2.3 x 3.8 cm compared to 4.8 x 7.9 cm with SUV 7.2 compared to 18.9 hypermetabolic activity in the region of the retroperitoneal reflection no longer evident, hypermetabolic soft tissue track extending from the bladder dome to the periumbilical region PET 07/31/2015-previously noted hypermetabolic mesenteric mass no longer visualized, no hypermetabolic lymphadenopathy or extranodal disease, midline subincisional fluid collection measuring 3 x 6 cm CT abdomen/pelvis 10/16/2020-compared to 05/29/2014 CT, no change in left lateral liver hypodensity, abnormal mesenteric adenopathy in the left abdomen at the site of a previous mesenteric mass, representative node measuring 15 mm, majority of nodes are 5-10 mm, no retroperitoneal or upper abdominal adenopathy, no pelvic sidewall or inguinal adenopathy CT abdomen/pelvis 09/28/2021-no change in mesenteric adenopathy, clearing of haziness in the mesentery, no new lymph nodes CT abdomen/pelvis 10/01/2022-no change in mesenteric adenopathy, prominent stool throughout the colon CT abdomen/pelvis 02/02/2023-stable mild mesenteric lymphadenopathy, new short segment of small bowel thickening in the right abdomen with adjacent stranding CT abdomen/pelvis 06/10/2023-dilated loops of small bowel with scattered air-fluid levels extending to a transition in the right abdomen where there is suspected wall thickening and a bowel loop at least over a 4.5 cm segment.  Appearance concerning for early or partial small bowel obstruction associated with inflamed or thick walled loops of small bowel.  Stable pathologic mesenteric adenopathy.  Stable scarring both lower lobes and in the right middle lobe. CT abdomen/pelvis 10/10/2023-negative for bowel obstruction or acute bowel wall thickening.  Multiple enlarged mesenteric nodes, stable, measuring up to 1.4 cm. PET 11/04/2023-hypermetabolic mesenteric root  lymphadenopathy predominantly in the left abdomen consistent with known lymphoma involvement, grossly stable to minimally decreased in size.  No new lymphadenopathy. 2.  Coronary artery disease  3.  Anxiety and depression 4.  Hyperlipidemia 5.  Anaphylactic reaction to ceftriaxone  08/18/2021  Disposition: Gabrielle [name] appears stable.  We reviewed the recent PET scan report/images with her and her husband at today's visit.  There are small hypermetabolic mesenteric lymph nodes, stable to minimally decreased in size.  Findings do not seem to explain the intermittent pain and obstructive symptoms she is experiencing.  She has no other symptoms to suggest progression of the lymphoma.  She had a PET scan in 2016 at another facility.  Her husband is going to obtain the disc to be loaded into our system.  She is scheduled for a colonoscopy and follow-up with  Dr. Avram next month.  She will return for an office visit in approximately 2 months.  We are available to see her sooner if needed.  Patient seen with Dr. Cloretta.  Olam Ned ANP/GNP-BC   11/09/2023  1:45 PM  This was a shared visit with Olam Ned.  We reviewed the PET findings and images with Gabrielle [name].  There are small hypermetabolic lymph nodes in the left abdominal mesentery.  No hypermetabolism at the area of thickening noted on the CT last month.  It is unlikely lymphoma is responsible for the intermittent obstructive symptoms.  She was diagnosed with follicular lymphoma in 2016 and has not required treatment.  She will follow-up with Dr. Avram in a few weeks.  It would be difficult to biopsy a mesenteric lymph node.  I recommend observation for now.  I was present for greater than 50% of today's visit.  I performed medical decision making.  Arvella Cloretta, MD

## 2023-11-11 ENCOUNTER — Telehealth: Payer: Self-pay | Admitting: Internal Medicine

## 2023-11-11 MED ORDER — LINACLOTIDE 145 MCG PO CAPS: 145.0000 ug | ORAL_CAPSULE | Freq: Every day | ORAL | 1 refills | Status: DC

## 2023-11-11 NOTE — Telephone Encounter (Signed)
 Patient has persistent constipation problems  Rx Linzess Explained to try 2 doses if one does not work

## 2023-11-16 ENCOUNTER — Other Ambulatory Visit: Payer: Medicare HMO | Admitting: Oncology

## 2023-11-16 ENCOUNTER — Telehealth: Payer: Self-pay | Admitting: Internal Medicine

## 2023-11-16 NOTE — Telephone Encounter (Signed)
 Patient remains constipated despite Linzess  145 mcg and 290 mcg in the morning over the past several days.  She started at 145 and then when that was not working well switch to 290 for 2 to 3 days.  She has very small very thin stools.  No more abdominal pain she is not distended.  I have recommended a MiraLAX purge 2 Dulcolax wait 1 hour then drink 6 doses of MiraLAX over 2 hours approximately.  She is to update me tomorrow.

## 2023-11-18 ENCOUNTER — Other Ambulatory Visit: Payer: Self-pay | Admitting: *Deleted

## 2023-11-29 IMAGING — CT CT ABD-PELV W/ CM
2 of 4 series · 16 of 46 positions shown, 18 images · IV contrast (APPLIED)
Comparison: 10/16/2020.

CLINICAL DATA: Follow-up non-Hodgkin's lymphoma. * Tracking Code:
BO *

EXAM:
CT ABDOMEN AND PELVIS WITH CONTRAST
TECHNIQUE: Multidetector CT imaging of the abdomen and pelvis was performed
using the standard protocol following bolus administration of
intravenous contrast.

[Series 2: abd pel w · axial · 0.74mm/px · z∈[-426,-11]mm · 13 of 91 slices shown, 15 images]
[im 4/91  soft-tissue]
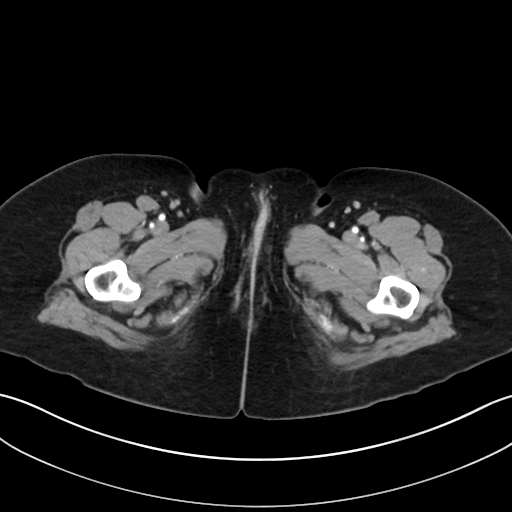
[im 4/91  bone]
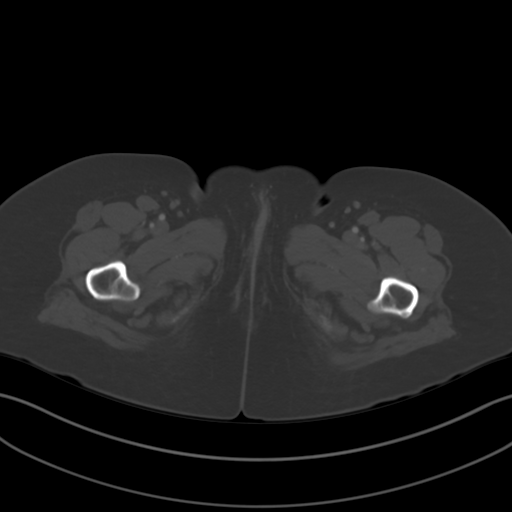
[im 12/91  soft-tissue]
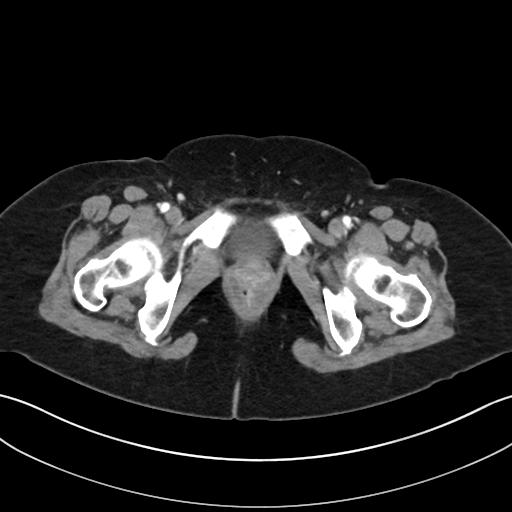
[im 19/91  soft-tissue]
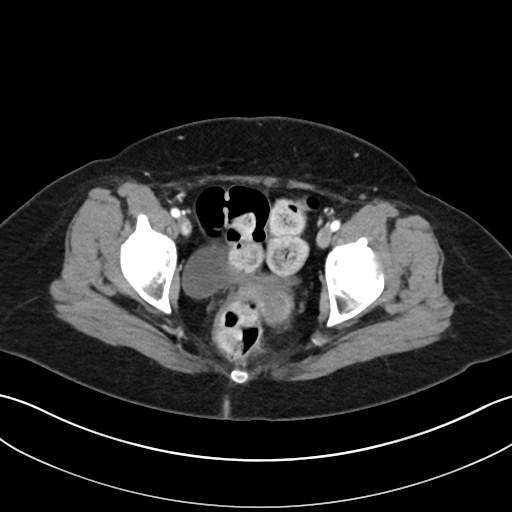
[im 27/91  soft-tissue]
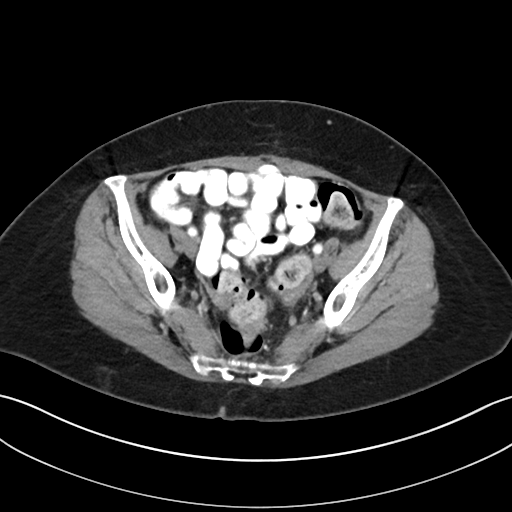
[im 31/91  soft-tissue]
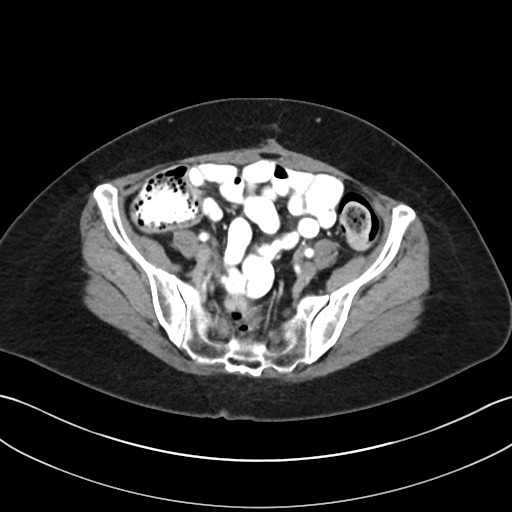
[im 38/91  soft-tissue]
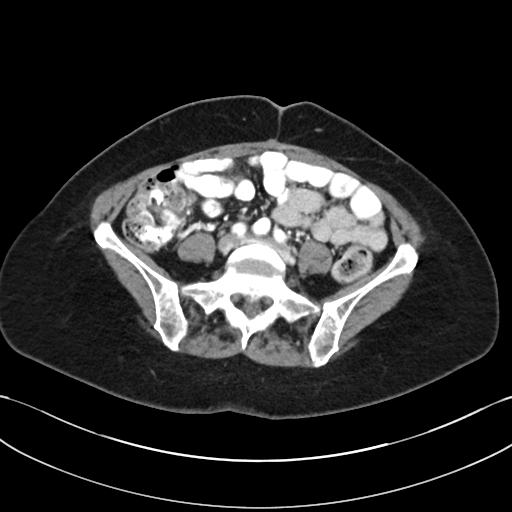
[im 46/91  soft-tissue]
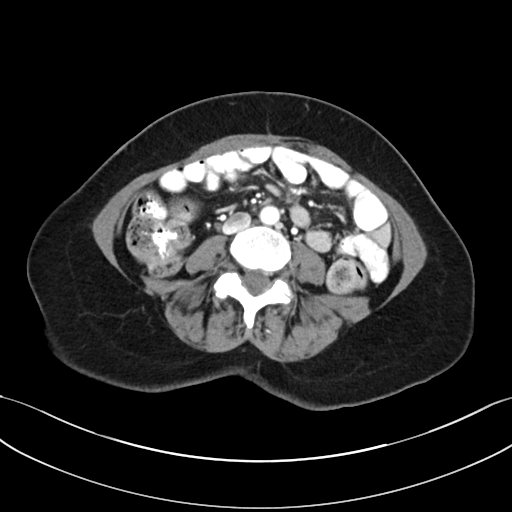
[im 53/91  soft-tissue]
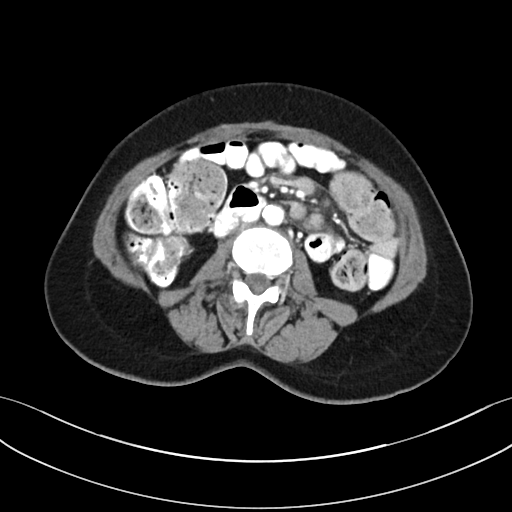
[im 61/91  soft-tissue]
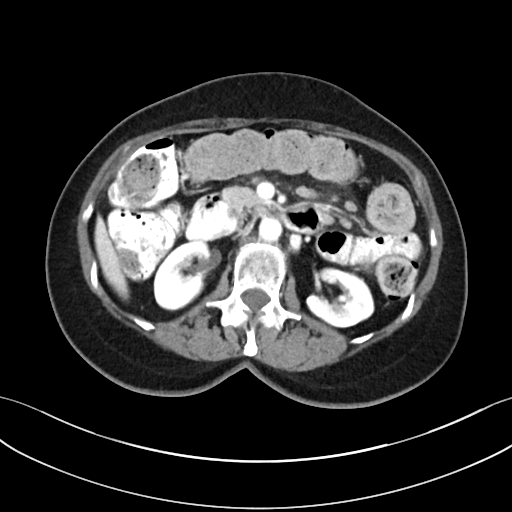
[im 61/91  bone]
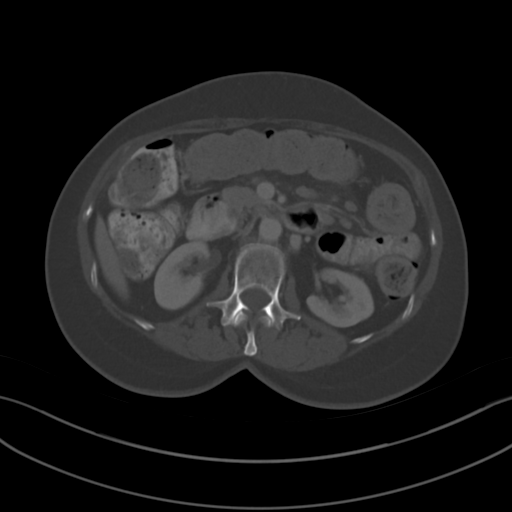
[im 64/91  soft-tissue]
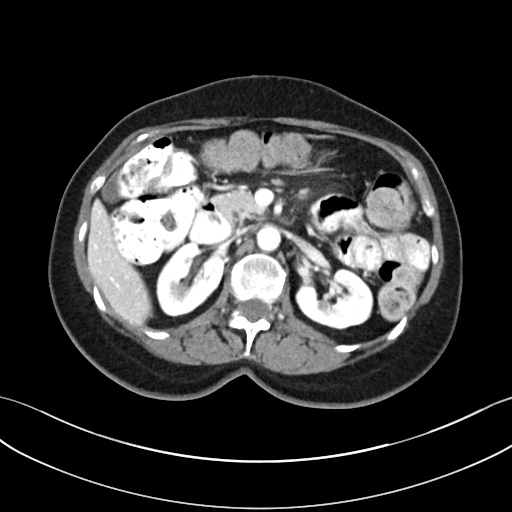
[im 72/91  soft-tissue]
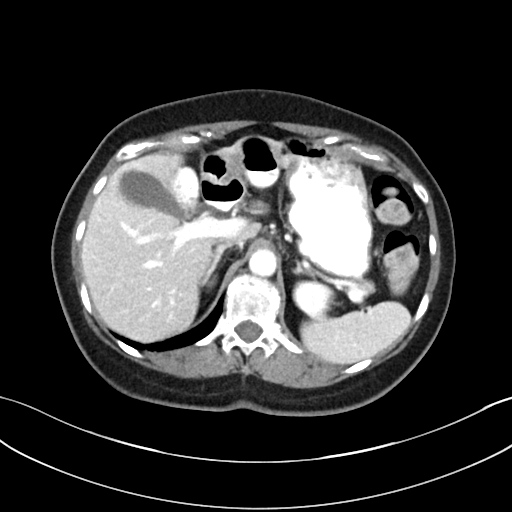
[im 79/91  soft-tissue]
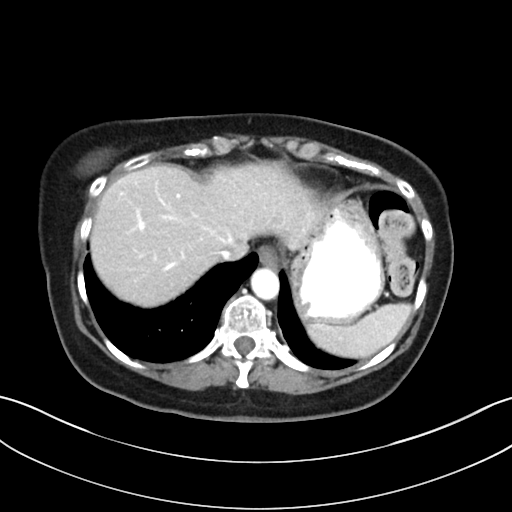
[im 87/91  soft-tissue]
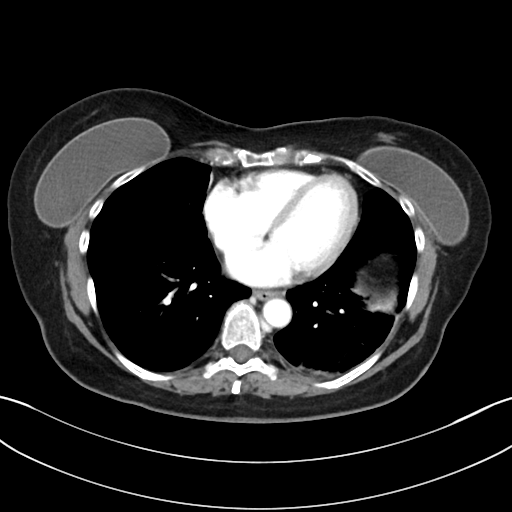

[Series 5: coronal · coronal · 0.73mm/px · 3 of 89 slices shown]
[im 30/89  soft-tissue]
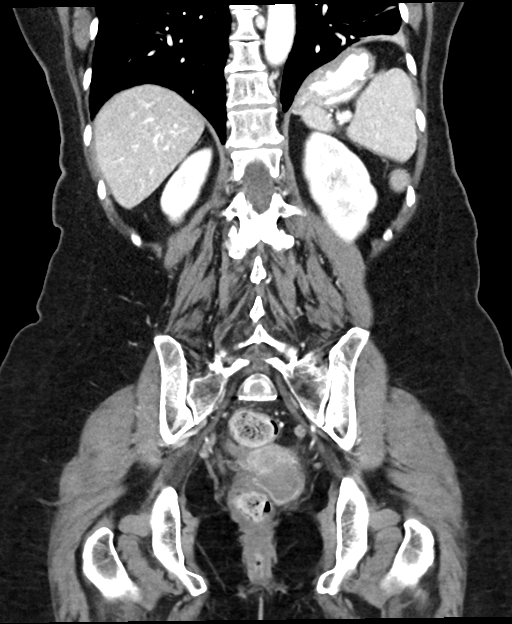
[im 40/89  soft-tissue]
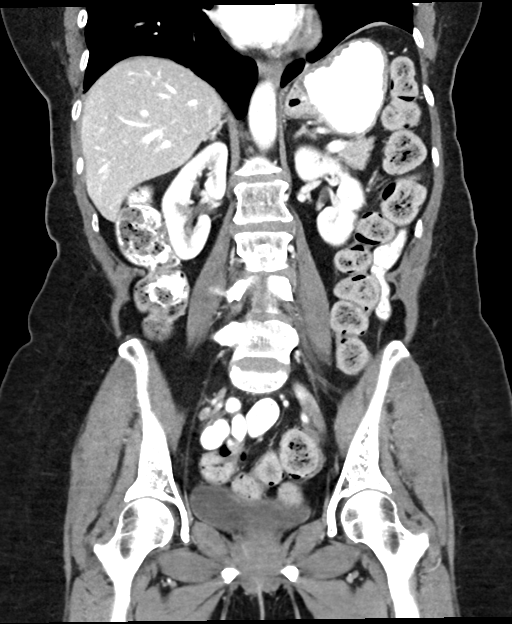
[im 49/89  soft-tissue]
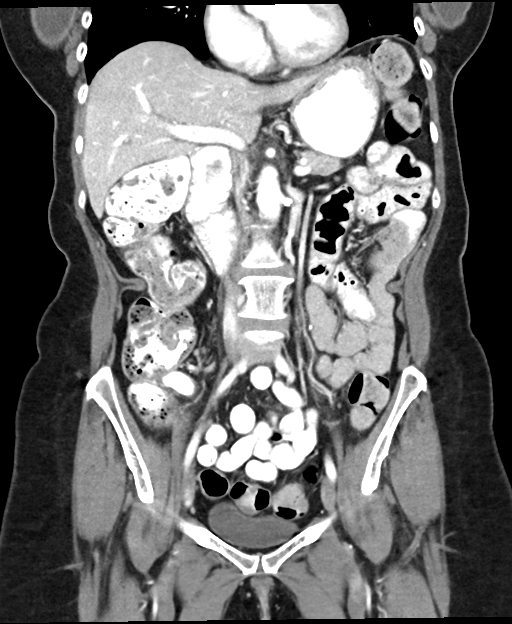

[16 of 46 positions shown; findings below may reference images not displayed]

RADIATION DOSE REDUCTION: This exam was performed according to the
departmental dose-optimization program which includes automated
exposure control, adjustment of the mA and/or kV according to
patient size and/or use of iterative reconstruction technique.

CONTRAST:  80mL OMNIPAQUE IOHEXOL 300 MG/ML  SOLN
FINDINGS: Lower chest: Mild linear atelectasis at the lung bases. Bilateral
breast prosthetics noted.

Hepatobiliary: No focal hepatic lesion. No biliary duct dilatation.
Common bile duct is normal.

Pancreas: Pancreas is normal. No ductal dilatation. No pancreatic
inflammation.

Spleen: Spleen is normal volume.

Adrenals/urinary tract: Adrenal glands and kidneys are normal. The
ureters and bladder normal.

Stomach/Bowel: Stomach, small bowel, appendix, and cecum are normal.
The colon and rectosigmoid colon are normal.

In the LEFT central mesentery again demonstrated several enlarged
lymph nodes. Example node measures 14 mm short axis (image 37/series
2) compared to 15 mm. Example node measuring 10 mm( image 35/2)
compares to 9 mm. 11 mm node ( image 40) compares to 10 mm. There is
interval clearing of the haziness within the mesentery seen on
comparison exam. No new mesenteric adenopathy.

Vascular/Lymphatic: Abdominal aorta is normal caliber. No periportal
or retroperitoneal adenopathy. No pelvic adenopathy. No inguinal
adenopathy

Reproductive: Uterus and adnexa unremarkable.

Other: No free fluid.

Musculoskeletal: No aggressive osseous lesion.
IMPRESSION: 1. No interval change in mesenteric adenopathy compared to CT exam 1
year prior.
2. Interval clearing of haziness within the mesentery seen on
comparison exam.
3. No new or enlarged lymph nodes.
4. Normal volume spleen.

## 2023-11-30 ENCOUNTER — Encounter: Payer: Self-pay | Admitting: Internal Medicine

## 2023-11-30 ENCOUNTER — Ambulatory Visit (AMBULATORY_SURGERY_CENTER): Admitting: Internal Medicine

## 2023-11-30 VITALS — BP 123/73 | HR 69 | Temp 98.1°F | Resp 17 | Ht 64.0 in | Wt 135.0 lb

## 2023-11-30 DIAGNOSIS — F418 Other specified anxiety disorders: Secondary | ICD-10-CM | POA: Diagnosis not present

## 2023-11-30 DIAGNOSIS — I509 Heart failure, unspecified: Secondary | ICD-10-CM | POA: Diagnosis not present

## 2023-11-30 DIAGNOSIS — K5909 Other constipation: Secondary | ICD-10-CM | POA: Diagnosis not present

## 2023-11-30 DIAGNOSIS — R933 Abnormal findings on diagnostic imaging of other parts of digestive tract: Secondary | ICD-10-CM | POA: Diagnosis not present

## 2023-11-30 DIAGNOSIS — I2510 Atherosclerotic heart disease of native coronary artery without angina pectoris: Secondary | ICD-10-CM | POA: Diagnosis not present

## 2023-11-30 MED ORDER — SODIUM CHLORIDE 0.9 % IV SOLN: 500.0000 mL | Freq: Once | INTRAVENOUS | Status: DC

## 2023-11-30 NOTE — Progress Notes (Signed)
 Report to PACU, RN, vss, BBS= Clear.

## 2023-11-30 NOTE — Op Note (Signed)
 [facility] Patient Name: Gabrielle [name] Procedure Date: 11/30/2023 3:12 PM MRN: [id number] Endoscopist: Lupita FORBES Commander , MD, [id number] Age: 63 Referring MD:  Date of Birth: [birth year] Gender: Female Account #: [id number] Procedure:                Colonoscopy Indications:              Abnormal CT of the GI tract, Constipation Medicines:                Monitored Anesthesia Care Procedure:                Pre-Anesthesia Assessment:                           - Prior to the procedure, a History and Physical                            was performed, and patient medications and                            allergies were reviewed. The patient's tolerance of                            previous anesthesia was also reviewed. The risks                            and benefits of the procedure and the sedation                            options and risks were discussed with the patient.                            All questions were answered, and informed consent                            was obtained. Prior Anticoagulants: The patient has                            taken no anticoagulant or antiplatelet agents. ASA                            Grade Assessment: III - A patient with severe                            systemic disease. After reviewing the risks and                            benefits, the patient was deemed in satisfactory                            condition to undergo the procedure.                           After obtaining informed consent, the colonoscope  was passed under direct vision. Throughout the                            procedure, the patient's blood pressure, pulse, and                            oxygen saturations were monitored continuously. The                            Olympus Scope SN: [id number] was introduced through                            the anus and advanced to the the terminal ileum,                            with  identification of the appendiceal orifice and                            IC valve. The colonoscopy was performed without                            difficulty. The patient tolerated the procedure                            well. The quality of the bowel preparation was                            good. The terminal ileum, ileocecal valve,                            appendiceal orifice, and rectum were photographed.                            The bowel preparation used was SUFLAVE  via extended                            prep with split dose instruction. Scope In: 3:34:46 PM Scope Out: 3:49:54 PM Scope Withdrawal Time: 0 hours 10 minutes 40 seconds  Total Procedure Duration: 0 hours 15 minutes 8 seconds  Findings:                 The perianal and digital rectal examinations were                            normal.                           The entire examined colon appeared normal on direct                            and retroflexion views. Complications:            No immediate complications. Estimated Blood Loss:     Estimated blood loss: none. Impression:               -  The entire examined colon is normal on direct and                            retroflexion views.                           - No specimens collected. Recommendation:           - Patient has a contact number available for                            emergencies. The signs and symptoms of potential                            delayed complications were discussed with the                            patient. Return to normal activities tomorrow.                            Written discharge instructions were provided to the                            patient.                           - Resume previous diet.                           - Continue present medications.                           - Repeat colonoscopy in 10 years for screening                            purposes. Lupita FORBES Commander, MD 11/30/2023 3:58:43 PM This report  has been signed electronically.

## 2023-11-30 NOTE — Patient Instructions (Addendum)
 Colonoscopy is normal - no polyps or cancer seen.  Let's see how things go with the constipation.   I appreciate the opportunity to care for you. Lupita CHARLENA Commander, MD, [facility]  Repeat colonoscopy in 10 years for screening purposes.   YOU HAD AN ENDOSCOPIC PROCEDURE TODAY AT THE [facility]:   Refer to the procedure report that was given to you for any specific questions about what was found during the examination.  If the procedure report does not answer your questions, please call your gastroenterologist to clarify.  If you requested that your care partner not be given the details of your procedure findings, then the procedure report has been included in a sealed envelope for you to review at your convenience later.  YOU SHOULD EXPECT: Some feelings of bloating in the abdomen. Passage of more gas than usual.  Walking can help get rid of the air that was put into your GI tract during the procedure and reduce the bloating. If you had a lower endoscopy (such as a colonoscopy or flexible sigmoidoscopy) you may notice spotting of blood in your stool or on the toilet paper. If you underwent a bowel prep for your procedure, you may not have a normal bowel movement for a few days.  Please Note:  You might notice some irritation and congestion in your nose or some drainage.  This is from the oxygen used during your procedure.  There is no need for concern and it should clear up in a day or so.  SYMPTOMS TO REPORT IMMEDIATELY:  Following lower endoscopy (colonoscopy or flexible sigmoidoscopy):  Excessive amounts of blood in the stool  Significant tenderness or worsening of abdominal pains  Swelling of the abdomen that is new, acute  Fever of 100F or higher  For urgent or emergent issues, a gastroenterologist can be reached at any hour by calling [phone]. Do not use MyChart messaging for urgent concerns.    DIET:  We do recommend a small meal at first, but then you may proceed to  your regular diet.  Drink plenty of fluids but you should avoid alcoholic beverages for 24 hours.  ACTIVITY:  You should plan to take it easy for the rest of today and you should NOT DRIVE or use heavy machinery until tomorrow (because of the sedation medicines used during the test).    FOLLOW UP: Our staff will call the number listed on your records the next business day following your procedure.  We will call around 7:15- 8:00 am to check on you and address any questions or concerns that you may have regarding the information given to you following your procedure. If we do not reach you, we will leave a message.     If any biopsies were taken you will be contacted by phone or by letter within the next 1-3 weeks.  Please call us  at [phone] if you have not heard about the biopsies in 3 weeks.    SIGNATURES/CONFIDENTIALITY: You and/or your care partner have signed paperwork which will be entered into your electronic medical record.  These signatures attest to the fact that that the information above on your After Visit Summary has been reviewed and is understood.  Full responsibility of the confidentiality of this discharge information lies with you and/or your care-partner.

## 2023-11-30 NOTE — Progress Notes (Signed)
 [facility] History and Physical   Primary Care Physician:  Maree Isles, MD   Reason for Procedure:    Encounter Diagnoses  Name Primary?   Chronic constipation Yes   Abnormal CT scan, colon      Plan:    colonoscopy     HPI: Gabrielle [name] is a 63 y.o. female w/ chronic constipation and recurrent abdominal pain issues. She has a hx of non-Hodgkin's lymphoma and perforated bowel (surgical complication). NHL is stable in observation. In early 2025 a CT suggested thickening of sigmoid colon, subsequent CT did not.   Past Medical History:  Diagnosis Date   Anxiety and depression    CAD (coronary artery disease)    MI + stent 2018   CHF (congestive heart failure) (HCC) 01/12/2023   ef 25% to 30%   Colon cancer screening 04/2019   Cologuard NEG.  Repeat 04/2022   Diffuse large B cell lymphoma (HCC) 2018   NHL-L peritoneal mesenteric mass-->no evidence of aggressive lymphoma as of 02/2021 hem/onc f/u   Dyspepsia    GERD (gastroesophageal reflux disease)    History of myocardial infarction 2018   Hx of migraines    remote past   Hypercholesterolemia    NHL (non-Hodgkin's lymphoma) (HCC) 2016   left peritoneal mesonteric mass   Palpitations 2015   no monitoring was done-->resolved spontaneously    Past Surgical History:  Procedure Laterality Date   biopsy  2016   Initial bx needle; then lab abd (done in [facility]) done to get further bx, exp lap--complications--sepsis/hemorrhage--memory/cognitive difficulties since that surgery/hospitalization--details somewhat cloudy from pt's info.   BREAST ENHANCEMENT SURGERY  2003   Heart Stent  2018   Prox LAD (murrells inlet).   INCISIONAL HERNIA REPAIR  2017   w/mesh   small bowel obstruction     TONSILLECTOMY     TRANSTHORACIC ECHOCARDIOGRAM  09/2013   2015 ALL NORMAL.  2020 normal per pt report (Drr. Larinda with [facility] [place]   WISDOM TOOTH EXTRACTION       Current Outpatient Medications  Medication Sig  Dispense Refill   ALPRAZolam  (XANAX ) 0.5 MG tablet Take 1 tablet (0.5 mg total) by mouth 3 (three) times daily. (Patient taking differently: Take 0.5 mg by mouth 2 (two) times daily as needed for anxiety.) 90 tablet 5   aspirin  EC 81 MG tablet Take 81 mg by mouth daily.     atorvastatin  (LIPITOR) 80 MG tablet TAKE 1 TABLET EVERY DAY 30 tablet 0   ezetimibe  (ZETIA ) 10 MG tablet Take 1 tablet (10 mg total) by mouth daily. 90 tablet 3   lamoTRIgine  (LAMICTAL ) 200 MG tablet Take 200 mg by mouth daily.     Magnesium  Hydroxide (MILK OF MAGNESIA PO) Take 15 mLs by mouth every other day.     metoprolol  succinate (TOPROL -XL) 25 MG 24 hr tablet TAKE 1/2 TABLET ONE TIME DAILY 45 tablet 3   Multiple Vitamins-Minerals (MULTIVITAMIN ADULT PO) Take by mouth daily.     naproxen sodium (ALEVE) 220 MG tablet Take 220 mg by mouth daily as needed (back pain).     omeprazole  (PRILOSEC) 20 MG capsule Take 20 mg by mouth daily.     PARoxetine  (PAXIL ) 40 MG tablet Take 1 tablet (40 mg total) by mouth daily. 30 tablet 0   linaclotide  (LINZESS ) 145 MCG CAPS capsule Take 1 capsule (145 mcg total) by mouth daily before breakfast. (Patient not taking: Reported on 11/30/2023) 30 capsule 1   nitroGLYCERIN (NITROSTAT) 0.4 MG  SL tablet Place 0.4 mg under the tongue every 5 (five) minutes as needed for chest pain. (Patient not taking: Reported on 11/30/2023)     Current Facility-Administered Medications  Medication Dose Route Frequency Provider Last Rate Last Admin   0.9 %  sodium chloride  infusion  500 mL Intravenous Once Avram Lupita BRAVO, MD        Allergies as of 11/30/2023 - Review Complete 11/30/2023  Allergen Reaction Noted   Cephalosporins Anaphylaxis 06/10/2023   Rocephin  [ceftriaxone ] Anaphylaxis, Hives, and Shortness Of Breath 08/18/2021   Sulfa antibiotics Itching and Rash 01/18/2012    Family History  Problem Relation Age of Onset   Heart disease Mother    Emphysema Mother    Hypertension Mother    Stroke  Mother    Depression Mother    Heart failure Mother    Hypertension Brother    Depression Brother    Stroke Maternal Aunt    Depression Maternal Aunt    CAD Maternal Aunt    Colon cancer Other        pt unsure family member   Esophageal cancer Neg Hx    Stomach cancer Neg Hx    Rectal cancer Neg Hx     Social History   Socioeconomic History   Marital status: Married    Spouse name: Not on file   Number of children: 0   Years of education: Not on file   Highest education level: Not on file  Occupational History   Occupation: Disability  Tobacco Use   Smoking status: Never   Smokeless tobacco: Never  Vaping Use   Vaping status: Never Used  Substance and Sexual Activity   Alcohol  use: No   Drug use: No   Sexual activity: Yes    Birth control/protection: Condom  Other Topics Concern   Not on file  Social History Narrative   Married to Ed, no children.   Relocated from [place] about 10/2018 returned to [place]  2022 approx   Educ: BA [employer], grad w/honors.   [employer] and others prior to disability.   Disabled due to bipolar, depression 2012.   No tob, alc.   No drug use.   Social Drivers of Corporate investment banker Strain: Low Risk  (09/23/2021)   Overall Financial Resource Strain (CARDIA)    Difficulty of Paying Living Expenses: Not hard at all  Food Insecurity: No Food Insecurity (06/12/2023)   Hunger Vital Sign    Worried About Running Out of Food in the Last Year: Never true    Ran Out of Food in the Last Year: Never true  Transportation Needs: No Transportation Needs (06/12/2023)   PRAPARE - Administrator, Civil Service (Medical): No    Lack of Transportation (Non-Medical): No  Physical Activity: Inactive (09/23/2021)   Exercise Vital Sign    Days of Exercise per Week: 0 days    Minutes of Exercise per Session: 0 min  Stress: No Stress Concern Present (09/23/2021)   [employer] of Occupational Health - Occupational Stress  Questionnaire    Feeling of Stress : Not at all  Social Connections: Moderately Isolated (09/23/2021)   Social Connection and Isolation Panel    Frequency of Communication with Friends and Family: More than three times a week    Frequency of Social Gatherings with Friends and Family: Once a week    Attends Religious Services: Never    Database administrator or Organizations: No  Attends Banker Meetings: Never    Marital Status: Married  Catering manager Violence: Not At Risk (06/12/2023)   Humiliation, Afraid, Rape, and Kick questionnaire    Fear of Current or Ex-Partner: No    Emotionally Abused: No    Physically Abused: No    Sexually Abused: No    Review of Systems:  All other review of systems negative except as mentioned in the HPI.  Physical Exam: Vital signs BP 131/75   Pulse 68   Temp 98.1 F (36.7 C) (Temporal)   Resp 10   Ht 5' 4 (1.626 m)   Wt 135 lb (61.2 kg)   LMP 10/20/2011   SpO2 100%   BMI 23.17 kg/m   General:   Alert,  Well-developed, well-nourished, pleasant and cooperative in NAD Lungs:  Clear throughout to auscultation.   Heart:  Regular rate and rhythm; no murmurs, clicks, rubs,  or gallops. Abdomen:  Soft, nontender and nondistended. Normal bowel sounds.   Neuro/Psych:  Alert and cooperative. Normal mood and affect. A and O x 3   @Gabrielle [name]  Gabrielle Commander, MD, [facility] [phone] (pager) 11/30/2023 3:18 PM@

## 2023-12-01 ENCOUNTER — Telehealth: Payer: Self-pay | Admitting: *Deleted

## 2023-12-01 NOTE — Telephone Encounter (Signed)
  Follow up Call-     11/30/2023    2:27 PM  Call back number  Post procedure Call Back phone  # [phone]  Permission to leave phone message Yes     Patient questions:  Do you have a fever, pain , or abdominal swelling? No. Pain Score  0 *  Have you tolerated food without any problems? Yes.    Have you been able to return to your normal activities? Yes.    Do you have any questions about your discharge instructions: Diet   No. Medications  No. Follow up visit  No.  Do you have questions or concerns about your Care? No.  Actions: * If pain score is 4 or above: No action needed, pain <4.

## 2023-12-08 ENCOUNTER — Encounter: Payer: Self-pay | Admitting: Internal Medicine

## 2023-12-08 ENCOUNTER — Ambulatory Visit: Admitting: Internal Medicine

## 2023-12-08 VITALS — BP 112/74 | HR 67 | Ht 64.0 in | Wt 134.0 lb

## 2023-12-08 DIAGNOSIS — K5909 Other constipation: Secondary | ICD-10-CM | POA: Diagnosis not present

## 2023-12-08 MED ORDER — LINACLOTIDE 72 MCG PO CAPS: 72.0000 ug | ORAL_CAPSULE | Freq: Every day | ORAL | 0 refills | Status: DC

## 2023-12-08 NOTE — Patient Instructions (Signed)
 We have provided you with samples of Linzess  72mcg. Take one daily.  Linzess  works best when taken once a day every day, on an empty stomach, at least 30 minutes before your first meal of the day.  When Linzess  is taken daily as directed:  *Constipation relief is typically felt in about a week *IBS-C patients may begin to experience relief from belly pain and overall abdominal symptoms (pain, discomfort, and bloating) in about 1 week,   with symptoms typically improving over 12 weeks.  Diarrhea may occur in the first 2 weeks -keep taking it.  The diarrhea should go away and you should start having normal, complete, full bowel movements. It may be helpful to start treatment when you can be near the comfort of your own bathroom, such as a weekend.   Update us  in a week with how its helping.  We are going to refer you for pelvic floor physical therapy. They will contact you about setting up an appointment.   I appreciate the opportunity to care for you. Lupita Commander, MD, [facility]

## 2023-12-08 NOTE — Progress Notes (Signed)
 Gabrielle [name] 63 y.o. [birth year] [id number]  Assessment & Plan:   Encounter Diagnosis  Name Primary?   Chronic constipation Yes      Chronic constipation - I think it is improved with more defecation but current dose of Linzess  at 145 mcg causing frequent bowel movements and urgency.  Normal colonoscopy last week . Pelvic floor dysfunction is suspected as a component, especially with history of lifelong constipation.  Discussed pelvic floor physical therapy benefits. - Discontinue Linzess  145 mcg. - Initiate Linzess  72 mcg with samples. - Refer to pelvic floor physical therapy for evaluation and management.       Subjective:   Chief Complaint: constipation  HPI Discussed the use of AI scribe software for clinical note transcription with the patient, who gave verbal consent to proceed.  Gabrielle [name] is a 63 year old female with non-Hodgkin's lymphoma, small bowel perforation, small bowel obstructions and chronic constipation who presents for follow-up after a colonoscopy.  She experiences ongoing issues with bowel movements despite recent colonoscopy results being normal. Her stools are described as 'muddy mush', and is concerned Linzess  is not effective in improving her symptoms. Bowel movements occur with a frequency of eight to ten times a day, characterized as neither diarrhea nor normal.   She also tried MiraLAX once this week without significant improvement. She feels worn out and notes that her bowel movements have not changed since before the colonoscopy last week (normal)  She describes a cycle of needing hospital visits for rehydration and pain management in the past, driven by abdominal pain. However, she currently does not experience abdominal pain or distension. She reports an accident during sleep since the colonoscopy and mentions urgency similar to diarrhea, though she has not had frequent episodes  Her bowel pattern has been consistent for a long time,  characterized by chronic constipation. She recalls having to strain and push when her stools were not mushy. No issues with urination, such as leakage or urgency.      Allergies  Allergen Reactions   Cephalosporins Anaphylaxis   Rocephin  [Ceftriaxone ] Anaphylaxis, Hives and Shortness Of Breath    Facial numbness   Sulfa Antibiotics Itching and Rash   Current Meds  Medication Sig   ALPRAZolam  (XANAX ) 0.5 MG tablet Take 1 tablet (0.5 mg total) by mouth 3 (three) times daily. (Patient taking differently: Take 0.5 mg by mouth 2 (two) times daily as needed for anxiety.)   aspirin  EC 81 MG tablet Take 81 mg by mouth daily.   atorvastatin  (LIPITOR) 80 MG tablet TAKE 1 TABLET EVERY DAY   ezetimibe  (ZETIA ) 10 MG tablet Take 1 tablet (10 mg total) by mouth daily.   lamoTRIgine  (LAMICTAL ) 200 MG tablet Take 200 mg by mouth daily.   linaclotide  (LINZESS ) 145 MCG CAPS capsule Take 1 capsule (145 mcg total) by mouth daily before breakfast.   Magnesium  Hydroxide (MILK OF MAGNESIA PO) Take 15 mLs by mouth every other day.   metoprolol  succinate (TOPROL -XL) 25 MG 24 hr tablet TAKE 1/2 TABLET ONE TIME DAILY   Multiple Vitamins-Minerals (MULTIVITAMIN ADULT PO) Take by mouth daily.   naproxen sodium (ALEVE) 220 MG tablet Take 220 mg by mouth daily as needed (back pain).   omeprazole  (PRILOSEC) 20 MG capsule Take 20 mg by mouth daily.   PARoxetine  (PAXIL ) 40 MG tablet Take 1 tablet (40 mg total) by mouth daily.   Past Medical History:  Diagnosis Date   Anxiety and depression    CAD (coronary artery  disease)    MI + stent 2018   CHF (congestive heart failure) (HCC)    prior reduced EF, 2025 NL   Colon cancer screening 04/2019   Cologuard NEG.  Repeat 04/2022   Diffuse large B cell lymphoma (HCC) 2018   NHL-L peritoneal mesenteric mass-->no evidence of aggressive lymphoma as of 02/2021 hem/onc f/u   Dyspepsia    GERD (gastroesophageal reflux disease)    History of myocardial infarction 2018   Hx of  migraines    remote past   Hypercholesterolemia    NHL (non-Hodgkin's lymphoma) (HCC) 2016   left peritoneal mesonteric mass   Palpitations 2015   no monitoring was done-->resolved spontaneously   Past Surgical History:  Procedure Laterality Date   biopsy  2016   Initial bx needle; then lab abd (done in [facility]) done to get further bx, exp lap--complications--sepsis/hemorrhage--memory/cognitive difficulties since that surgery/hospitalization--details somewhat cloudy from pt's info.   BREAST ENHANCEMENT SURGERY  2003   Heart Stent  2018   Prox LAD ([place]s inlet).   INCISIONAL HERNIA REPAIR  2017   w/mesh   small bowel obstruction     TONSILLECTOMY     TRANSTHORACIC ECHOCARDIOGRAM  09/2013   2015 ALL NORMAL.  2020 normal per pt report (Drr. Larinda with [facility] [place]   WISDOM TOOTH EXTRACTION     Social History   Social History Narrative   Married to Ed, no children.   Relocated from [place] about 10/2018 returned to [place] 2022 approx   Educ: BA [employer], grad w/honors.   [employer] and others prior to disability.   Disabled due to bipolar, depression 2012.   No tob, alc.   No drug use.   family history includes CAD in her maternal aunt; Colon cancer in an other family member; Depression in her brother, maternal aunt, and mother; Emphysema in her mother; Heart disease in her mother; Heart failure in her mother; Hypertension in her brother and mother; Stroke in her maternal aunt and mother.   Review of Systems As per HPI  Objective:   Physical Exam BP 112/74   Pulse 67   Ht 5' 4 (1.626 m)   Wt 134 lb (60.8 kg)   LMP 10/20/2011   BMI 23.00 kg/m

## 2023-12-15 DIAGNOSIS — E2839 Other primary ovarian failure: Secondary | ICD-10-CM | POA: Diagnosis not present

## 2023-12-15 DIAGNOSIS — Z1331 Encounter for screening for depression: Secondary | ICD-10-CM | POA: Diagnosis not present

## 2023-12-15 DIAGNOSIS — Z0000 Encounter for general adult medical examination without abnormal findings: Secondary | ICD-10-CM | POA: Diagnosis not present

## 2023-12-15 DIAGNOSIS — Z299 Encounter for prophylactic measures, unspecified: Secondary | ICD-10-CM | POA: Diagnosis not present

## 2023-12-15 DIAGNOSIS — Z1339 Encounter for screening examination for other mental health and behavioral disorders: Secondary | ICD-10-CM | POA: Diagnosis not present

## 2023-12-16 ENCOUNTER — Encounter: Payer: Self-pay | Admitting: Internal Medicine

## 2023-12-16 ENCOUNTER — Other Ambulatory Visit: Payer: Self-pay | Admitting: Internal Medicine

## 2023-12-16 MED ORDER — LINACLOTIDE 72 MCG PO CAPS: 72.0000 ug | ORAL_CAPSULE | Freq: Every day | ORAL | 3 refills | Status: DC

## 2023-12-29 DIAGNOSIS — R002 Palpitations: Secondary | ICD-10-CM | POA: Diagnosis not present

## 2023-12-29 DIAGNOSIS — R072 Precordial pain: Secondary | ICD-10-CM | POA: Diagnosis not present

## 2023-12-29 DIAGNOSIS — E785 Hyperlipidemia, unspecified: Secondary | ICD-10-CM | POA: Diagnosis not present

## 2023-12-29 DIAGNOSIS — I2510 Atherosclerotic heart disease of native coronary artery without angina pectoris: Secondary | ICD-10-CM | POA: Diagnosis not present

## 2024-01-06 DIAGNOSIS — N771 Vaginitis, vulvitis and vulvovaginitis in diseases classified elsewhere: Secondary | ICD-10-CM | POA: Diagnosis not present

## 2024-01-06 DIAGNOSIS — N760 Acute vaginitis: Secondary | ICD-10-CM | POA: Diagnosis not present

## 2024-01-19 ENCOUNTER — Inpatient Hospital Stay: Attending: Nurse Practitioner | Admitting: Oncology

## 2024-01-19 VITALS — BP 110/64 | HR 66 | Temp 98.0°F | Resp 18 | Ht 64.0 in | Wt 136.3 lb

## 2024-01-19 DIAGNOSIS — K5900 Constipation, unspecified: Secondary | ICD-10-CM | POA: Diagnosis not present

## 2024-01-19 DIAGNOSIS — C8290 Follicular lymphoma, unspecified, unspecified site: Secondary | ICD-10-CM | POA: Diagnosis not present

## 2024-01-19 DIAGNOSIS — C8580 Other specified types of non-Hodgkin lymphoma, unspecified site: Secondary | ICD-10-CM | POA: Diagnosis not present

## 2024-01-19 NOTE — Progress Notes (Signed)
  [facility] OFFICE PROGRESS NOTE   Diagnosis: Non-Hodgkin's lymphoma  INTERVAL HISTORY:   Ms. Slabach returns as scheduled.  She reports improvement in abdominal pain and constipation.  She had a colonoscopy by Dr. Avram 10/14/2023.  The colonoscopy was normal.  She was placed on Linzess  for constipation.  She was unable to tolerate the Linzess .  She is now using milk of magnesia for constipation.  She reports a few episodes of night sweats the past few months.  No fever or recent infection.  Good appetite.  Objective:  Vital signs in last 24 hours:  Blood pressure 110/64, pulse 66, temperature 98 F (36.7 C), temperature source Temporal, resp. rate 18, height 5' 4 (1.626 m), weight 136 lb 4.8 oz (61.8 kg), last menstrual period 10/20/2011, SpO2 98%.    Lymphatics: No cervical, supraclavicular, axillary, or inguinal nodes Resp: Lungs clear bilaterally Cardio: Regular rate and rhythm GI: No hepatosplenomegaly, no mass, nontender Vascular: No leg edema  Lab Results:  Lab Results  Component Value Date   WBC 7.0 10/10/2023   HGB 14.1 10/10/2023   HCT 42.7 10/10/2023   MCV 100.2 (H) 10/10/2023   PLT 197 10/10/2023   NEUTROABS 8.5 (H) 06/10/2023    CMP  Lab Results  Component Value Date   NA 139 10/10/2023   K 4.0 10/10/2023   CL 105 10/10/2023   CO2 26 10/10/2023   GLUCOSE 131 (H) 10/10/2023   BUN 24 (H) 10/10/2023   CREATININE 0.81 10/10/2023   CALCIUM  9.1 10/10/2023   PROT 6.8 10/10/2023   ALBUMIN 4.3 10/10/2023   AST 27 10/10/2023   ALT 24 10/10/2023   ALKPHOS 105 10/10/2023   BILITOT 1.1 10/10/2023   GFRNONAA >60 10/10/2023   GFRAA  06/20/2010    >60        The eGFR has been calculated using the MDRD equation. This calculation has not been validated in all clinical situations. eGFR's persistently <60 mL/min signify possible Chronic Kidney Disease.    No results found for: CEA1, CEA, CAN199, CA125  Lab Results  Component Value  Date   INR 0.98 06/04/2014   LABPROT 13.1 06/04/2014    Imaging:  No results found.  Medications: I have reviewed the patient's current medications.   Assessment/Plan:  Non-Hodgkin lymphoma, B-cell follicular center cell type CT abdomen/pelvis 05/29/2014-left abdominal intraperitoneal mass, subcentimeter liver lesions felt to be benign, no adenopathy CT-guided biopsy of left abdominal mass 06/04/2014-non-Hodgkin B-cell lymphoma, follicular center cell type, high-grade favored, abundance of large lymphocytes admixed with small angulated lymphocytes Relocated to [facility] , underwent an open diagnostic biopsy 07/16/2014-pathology from mesenteric lymph node biopsies revealed sclerosing follicular B-cell lymphoma, CD20 positive, CD10 positive, predominantly diffuse architecture, low-grade.  Flow cytometry revealed a monoclonal B-cell population, lambda light chain restricted, 12.9% large cells, c-Myc rearrangement not detected, KI-67 20% She was not treated for lymphoma  PET 12/12/2014-markedly decreased size of hypermetabolic mesenteric mass, measuring 2.3 x 3.8 cm compared to 4.8 x 7.9 cm with SUV 7.2 compared to 18.9 hypermetabolic activity in the region of the retroperitoneal reflection no longer evident, hypermetabolic soft tissue track extending from the bladder dome to the periumbilical region PET 07/31/2015-previously noted hypermetabolic mesenteric mass no longer visualized, no hypermetabolic lymphadenopathy or extranodal disease, midline subincisional fluid collection measuring 3 x 6 cm CT abdomen/pelvis 10/16/2020-compared to 05/29/2014 CT, no change in left lateral liver hypodensity, abnormal mesenteric adenopathy in the left abdomen at the site of a previous mesenteric mass, representative node measuring  15 mm, majority of nodes are 5-10 mm, no retroperitoneal or upper abdominal adenopathy, no pelvic sidewall or inguinal adenopathy CT abdomen/pelvis 09/28/2021-no change in  mesenteric adenopathy, clearing of haziness in the mesentery, no new lymph nodes CT abdomen/pelvis 10/01/2022-no change in mesenteric adenopathy, prominent stool throughout the colon CT abdomen/pelvis 02/02/2023-stable mild mesenteric lymphadenopathy, new short segment of small bowel thickening in the right abdomen with adjacent stranding CT abdomen/pelvis 06/10/2023-dilated loops of small bowel with scattered air-fluid levels extending to a transition in the right abdomen where there is suspected wall thickening and a bowel loop at least over a 4.5 cm segment.  Appearance concerning for early or partial small bowel obstruction associated with inflamed or thick walled loops of small bowel.  Stable pathologic mesenteric adenopathy.  Stable scarring both lower lobes and in the right middle lobe. CT abdomen/pelvis 10/10/2023-negative for bowel obstruction or acute bowel wall thickening.  Multiple enlarged mesenteric nodes, stable, measuring up to 1.4 cm. PET 11/04/2023-hypermetabolic mesenteric root lymphadenopathy predominantly in the left abdomen consistent with known lymphoma involvement, grossly stable to minimally decreased in size.  No new lymphadenopathy. 2.  Coronary artery disease 3.  Anxiety and depression 4.  Hyperlipidemia 5.  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 Dileo appears well.  GI symptoms she experienced several months ago have improved.  She has a history of low-grade non-Hodgkin's lymphoma.  It is unlikely her symptoms are related to lymphoma.  The plan is to continue observation.  She will return for an office and lab visit in 4 months. I reviewed CT images from 2016 compared to the recent PET.  The dominant left abdominal mass is significantly improved with remaining small mesenteric lymph nodes. Arley Hof, MD  01/19/2024  12:22 PM

## 2024-01-20 ENCOUNTER — Telehealth: Payer: Self-pay | Admitting: *Deleted

## 2024-01-20 NOTE — Telephone Encounter (Signed)
 LVM to check Mychart message for MD assessment of CT comparsion

## 2024-01-26 DIAGNOSIS — Z1151 Encounter for screening for human papillomavirus (HPV): Secondary | ICD-10-CM | POA: Diagnosis not present

## 2024-01-26 DIAGNOSIS — N952 Postmenopausal atrophic vaginitis: Secondary | ICD-10-CM | POA: Diagnosis not present

## 2024-01-26 DIAGNOSIS — Z6825 Body mass index (BMI) 25.0-25.9, adult: Secondary | ICD-10-CM | POA: Diagnosis not present

## 2024-01-26 DIAGNOSIS — Z124 Encounter for screening for malignant neoplasm of cervix: Secondary | ICD-10-CM | POA: Diagnosis not present

## 2024-01-26 DIAGNOSIS — Z01419 Encounter for gynecological examination (general) (routine) without abnormal findings: Secondary | ICD-10-CM | POA: Diagnosis not present

## 2024-01-31 NOTE — Therapy (Unsigned)
 OUTPATIENT PHYSICAL THERAPY FEMALE PELVIC EVALUATION   Patient Name: Gabrielle [name] MRN: [id number] DOB:1960-08-07, 63 y.o., female Today's Date: 01/31/2024  END OF SESSION:   Past Medical History:  Diagnosis Date   Anxiety and depression    CAD (coronary artery disease)    MI + stent 2018   CHF (congestive heart failure) (HCC)    prior reduced EF, 2025 NL   Colon cancer screening 04/2019   Cologuard NEG.  Repeat 04/2022   Diffuse large B cell lymphoma (HCC) 2018   NHL-L peritoneal mesenteric mass-->no evidence of aggressive lymphoma as of 02/2021 hem/onc f/u   Dyspepsia    GERD (gastroesophageal reflux disease)    History of myocardial infarction 2018   Hx of migraines    remote past   Hypercholesterolemia    NHL (non-Hodgkin's lymphoma) (HCC) 2016   left peritoneal mesonteric mass   Palpitations 2015   no monitoring was done-->resolved spontaneously   Past Surgical History:  Procedure Laterality Date   biopsy  2016   Initial bx needle; then lab abd (done in [facility]) done to get further bx, exp lap--complications--sepsis/hemorrhage--memory/cognitive difficulties since that surgery/hospitalization--details somewhat cloudy from pt's info.   BREAST ENHANCEMENT SURGERY  2003   Heart Stent  2018   Prox LAD (murrells inlet).   INCISIONAL HERNIA REPAIR  2017   w/mesh   small bowel obstruction     TONSILLECTOMY     TRANSTHORACIC ECHOCARDIOGRAM  09/2013   2015 ALL NORMAL.  2020 normal per pt report (Drr. Larinda with [facility] [place]   WISDOM TOOTH EXTRACTION     Patient Active Problem List   Diagnosis Date Noted   Essential hypertension 06/11/2023   Hyperlipidemia 06/11/2023   Anxiety and depression 06/11/2023   Abnormal CT of the abdomen 06/11/2023   Chronic idiopathic constipation 06/11/2023   Small bowel obstruction (HCC) 06/11/2023   SBO (small bowel obstruction) (HCC) 06/10/2023   Chronic coronary artery disease 03/12/2019   Myocardial infarction (HCC) 03/04/2017    Diffuse non-Hodgkin's lymphoma (HCC) 03/04/2014   Palpitations 09/24/2013   Dyspnea 09/24/2013    PCP: Maree Isles, MD  REFERRING PROVIDER: Avram Lupita BRAVO, MD   REFERRING DIAG: K59.09 (ICD-10-CM) - Chronic constipation   THERAPY DIAG:  No diagnosis found.  Rationale for Evaluation and Treatment: Rehabilitation  ONSET DATE: ***  SUBJECTIVE:                                                                                                                                                                                           SUBJECTIVE STATEMENT: *** Fluid intake:   FUNCTIONAL LIMITATIONS: ***  PERTINENT HISTORY:  Medications for current condition: Linzess  Surgeries: Incisional hernia repair Other: non-hodgkin's lymphoma; small bowel perforation; small bowel obstructions ; CHF; Diffuse large B cell lymphoma Sexual abuse: {Yes/No:304960894}  DIAGNOSTIC FINDINGS:  Post-void residual: Voiding Cystourethrogram (VCUG):  Ultrasound: PAIN:  Are you having pain? {yes/no:20286} NPRS scale: ***/10 Pain location: {pelvic pain location:27098}  Pain type: {type:313116} Pain description: {PAIN DESCRIPTION:21022940}   Aggravating factors: *** Relieving factors: ***  PRECAUTIONS: {Therapy precautions:24002}  RED FLAGS: {PT Red Flags:29287}   WEIGHT BEARING RESTRICTIONS: No  FALLS:  Has patient fallen in last 6 months? {fallsyesno:27318}  OCCUPATION: ***  ACTIVITY LEVEL : ***  PLOF: {PLOF:24004}  PATIENT GOALS: ***   BOWEL MOVEMENT: Pain with bowel movement: {yes/no:20286} Type of bowel movement:Type (Bristol Stool Scale) ***, Frequency 8-10 times, Strain ***, and Splinting *** Fully empty rectum: {No/Yes:304960894} Leakage: {Yes/No:304960894}                                                     Caused by: *** Pads: {Yes/No:304960894} Fiber supplement/laxative {YES/NO AS:20300}  URINATION: Pain with urination: {yes/no:20286} Fully empty bladder:  {Yes/No:304960894}***                                Post-void dribble: {YES/NO AS:20300} Stream: {PT urination:27102} Urgency: {YES/NO AS:20300} Frequency:during the day ***                                                         Nocturia: {Yes/No:304960894}***   Leakage: {PT leakage:27103} Pads/briefs: {Yes/No:304960894}  INTERCOURSE:  Ability to have vaginal penetration {YES/NO:21197} Pain with intercourse: {pain with intercourse PA:27099} Dryness: {YES/NO AS:20300} Climax: *** Marinoff Scale: ***/3 Lubricant:   PROLAPSE: {PT prolapse:27101}   OBJECTIVE:  Note: Objective measures were completed at Evaluation unless otherwise noted.  DIAGNOSTIC FINDINGS:  Colonoscopy normal  PATIENT SURVEYS:  {rehab surveys:24030}  PFIQ-7: ***  COGNITION: Overall cognitive status: {cognition:24006}     SENSATION: Light touch: {intact/deficits:24005}  LUMBAR SPECIAL TESTS:  {lumbar special test:25242}  FUNCTIONAL TESTS:  {Functional tests:24029} Single leg stance:  Rt:  Lt: Sit-up test: Squat: Bed mobility:  GAIT: Assistive device utilized: {Assistive devices:23999} Comments: ***  POSTURE: {posture:25561}   LUMBARAROM/PROM:  A/PROM A/PROM  Eval (% available)  Flexion   Extension   Right lateral flexion   Left lateral flexion   Right rotation   Left rotation    (Blank rows = not tested)  LOWER EXTREMITY ROM:  {AROM/PROM:27142} ROM Right eval Left eval  Hip flexion    Hip extension    Hip abduction    Hip adduction    Hip internal rotation    Hip external rotation    Knee flexion    Knee extension    Ankle dorsiflexion    Ankle plantarflexion    Ankle inversion    Ankle eversion     (Blank rows = not tested)  LOWER EXTREMITY MMT:  MMT Right eval Left eval  Hip flexion    Hip extension    Hip abduction    Hip adduction    Hip internal rotation    Hip external rotation  Knee flexion    Knee extension    Ankle dorsiflexion    Ankle  plantarflexion    Ankle inversion    Ankle eversion     (Blank rows = not tested) PALPATION:  General: ***  Pelvic Alignment: ***  Abdominal: ***  Diastasis: {Yes/No:304960894}*** Distortion: {YES/NO AS:20300}  Breathing: *** Scar tissue: {Yes/No:304960894}***                External Perineal Exam: ***                             Internal Pelvic Floor: ***  Patient confirms identification and approves PT to assess internal pelvic floor and treatment {yes/no:20286}  PELVIC MMT:   MMT eval  Vaginal   Internal Anal Sphincter   External Anal Sphincter   Puborectalis   Diastasis Recti   (Blank rows = not tested)        TONE: ***  PROLAPSE: ***  TODAY'S TREATMENT:                                                                                                                              DATE: ***  EVAL ***   PATIENT EDUCATION:  Education details: *** Person educated: {Person educated:25204} Education method: {Education Method:25205} Education comprehension: {Education Comprehension:25206}  HOME EXERCISE PROGRAM: ***  ASSESSMENT:  CLINICAL IMPRESSION: Patient is a *** y.o. *** who was seen today for physical therapy evaluation and treatment for ***.   OBJECTIVE IMPAIRMENTS: {opptimpairments:25111}.   ACTIVITY LIMITATIONS: {activitylimitations:27494}  PARTICIPATION LIMITATIONS: {participationrestrictions:25113}  PERSONAL FACTORS: {Personal factors:25162} are also affecting patient's functional outcome.   REHAB POTENTIAL: {rehabpotential:25112}  CLINICAL DECISION MAKING: {clinical decision making:25114}  EVALUATION COMPLEXITY: {Evaluation complexity:25115}   GOALS: Goals reviewed with patient? {yes/no:20286}  SHORT TERM GOALS: Target date: ***  *** Baseline: Goal status: INITIAL  2.  *** Baseline:  Goal status: INITIAL  3.  *** Baseline:  Goal status: INITIAL  4.  *** Baseline:  Goal status: INITIAL  5.  *** Baseline:  Goal status:  INITIAL  6.  *** Baseline:  Goal status: INITIAL  LONG TERM GOALS: Target date: ***  *** Baseline:  Goal status: INITIAL  2.  *** Baseline:  Goal status: INITIAL  3.  *** Baseline:  Goal status: INITIAL  4.  *** Baseline:  Goal status: INITIAL  5.  *** Baseline:  Goal status: INITIAL  6.  *** Baseline:  Goal status: INITIAL  PLAN:  PT FREQUENCY: {rehab frequency:25116}  PT DURATION: {rehab duration:25117}  PLANNED INTERVENTIONS: {rehab planned interventions:25118::97110-Therapeutic exercises,97530- Therapeutic [phone]- Neuromuscular re-education,97535- Self Rjmz,02859- Manual therapy,Patient/Family education}  PLAN FOR NEXT SESSION: ***   Laylah Riga, PT 01/31/2024, 9:19 AM;e

## 2024-02-01 ENCOUNTER — Ambulatory Visit: Attending: Internal Medicine | Admitting: Physical Therapy

## 2024-02-01 DIAGNOSIS — K5909 Other constipation: Secondary | ICD-10-CM | POA: Insufficient documentation

## 2024-02-01 DIAGNOSIS — M6281 Muscle weakness (generalized): Secondary | ICD-10-CM | POA: Insufficient documentation

## 2024-02-01 DIAGNOSIS — R293 Abnormal posture: Secondary | ICD-10-CM | POA: Insufficient documentation

## 2024-02-01 NOTE — Patient Instructions (Signed)
 Types of Fiber  There are two main types of fiber:  insoluble and soluble.  Both of these types can prevent and relieve constipation and diarrhea, although some people find one or the other to be more easily digested.  This handout details information about both types of fiber. recommended 25-35 grams of fiber per day,  average 9-12 grams per meal   key is a balance between soluble and insoluble  Insoluble Fiber        Functions of Insoluble Fiber moves bulk through the intestines  controls and balances the pH (acidity) in the intestines   This type of fiber should be avoided or reduced if you have soft, frequent bowel movements or leakage      Benefits of Insoluble Fiber promotes regular bowel movement and prevents constipation  removes fecal waste through colon in less time  keeps an optimal pH in intestines to prevent microbes from producing cancer substances, therefore preventing colon cancer        Food Sources of Insoluble Fiber whole-wheat products  wheat bran "miller's bran" corn bran  flax seed or other seeds vegetables such as green beans, broccoli, cauliflower and potato skins  fruit skins and root vegetable skins  popcorn brown rice  Soluble Fiber( Types 5,6,7 (more liquid stools)       Functions of Soluble Fiber  holds water in the colon to bulk and soften the stool prolongs stomach emptying time so that sugar is released and absorbed more slowly  prevent leakage associated with soft, frequent bowel movements.        Benefits of Soluble Fiber lowers total cholesterol and LDL cholesterol (the bad cholesterol) therefore reducing the risk of heart disease  regulates blood sugar for people with diabetes       Food Sources of Soluble Fiber oat/oat bran dried beans and peas  nuts  barley  flax seed or other seeds fruits such as oranges, pears, peaches, and apples  vegetables such as carrots  psyllium husk  prunes   . 3-5 mins while laying down, circles from  right to left. Shouldn't be painful.   Balloon breathing - blow air through open fist like blowing up a balloon instead of straining.

## 2024-02-01 NOTE — Therapy (Signed)
 OUTPATIENT PHYSICAL THERAPY FEMALE PELVIC EVALUATION   Patient Name: Gabrielle [name] MRN: [id number] DOB:[birth year], 63 y.o., female Today's Date: 02/01/2024  END OF SESSION:  PT End of Session - 02/01/24 1408     Visit Number 1    Date for Recertification  03/03/24    Authorization Type [employer] MEDICARE    PT Start Time 1405    PT Stop Time 1430    PT Time Calculation (min) 25 min    Activity Tolerance Patient tolerated treatment well    Behavior During Therapy [facility] for tasks assessed/performed          Past Medical History:  Diagnosis Date   Anxiety and depression    CAD (coronary artery disease)    MI + stent 2018   CHF (congestive heart failure) (HCC)    prior reduced EF, 2025 NL   Colon cancer screening 04/2019   Cologuard NEG.  Repeat 04/2022   Diffuse large B cell lymphoma (HCC) 2018   NHL-L peritoneal mesenteric mass-->no evidence of aggressive lymphoma as of 02/2021 hem/onc f/u   Dyspepsia    GERD (gastroesophageal reflux disease)    History of myocardial infarction 2018   Hx of migraines    remote past   Hypercholesterolemia    NHL (non-Hodgkin's lymphoma) (HCC) 2016   left peritoneal mesonteric mass   Palpitations 2015   no monitoring was done-->resolved spontaneously   Past Surgical History:  Procedure Laterality Date   biopsy  2016   Initial bx needle; then lab abd (done in [facility]) done to get further bx, exp lap--complications--sepsis/hemorrhage--memory/cognitive difficulties since that surgery/hospitalization--details somewhat cloudy from pt's info.   BREAST ENHANCEMENT SURGERY  2003   Heart Stent  2018   Prox LAD (murrells inlet).   INCISIONAL HERNIA REPAIR  2017   w/mesh   small bowel obstruction     TONSILLECTOMY     TRANSTHORACIC ECHOCARDIOGRAM  09/2013   2015 ALL NORMAL.  2020 normal per pt report (Drr. Larinda with [facility] [place]   WISDOM TOOTH EXTRACTION     Patient Active Problem List   Diagnosis Date Noted   Essential hypertension  06/11/2023   Hyperlipidemia 06/11/2023   Anxiety and depression 06/11/2023   Abnormal CT of the abdomen 06/11/2023   Chronic idiopathic constipation 06/11/2023   Small bowel obstruction (HCC) 06/11/2023   SBO (small bowel obstruction) (HCC) 06/10/2023   Chronic coronary artery disease 03/12/2019   Myocardial infarction (HCC) 03/04/2017   Diffuse non-Hodgkin's lymphoma (HCC) 03/04/2014   Palpitations 09/24/2013   Dyspnea 09/24/2013    PCP: Maree Isles, MD   REFERRING PROVIDER: Avram Lupita BRAVO, MD  REFERRING DIAG: K59.09 (ICD-10-CM) - Chronic constipation  THERAPY DIAG:  Muscle weakness (generalized)  Abnormal posture  Rationale for Evaluation and Treatment: Rehabilitation  ONSET DATE: chronic  SUBJECTIVE:  SUBJECTIVE STATEMENT: Reports has had great improvement with bowel habits, emptying daily, no straining, fully emptying, formed type 4-5 now. Now talking milk of magnesia   Fluid intake: water - 3 glasses, coffee in morning. Will sometimes split water and a lemonade.   FUNCTIONAL LIMITATIONS: none  PERTINENT HISTORY:  Medications for current condition: no Surgeries: no Other: no Sexual abuse: No  DIAGNOSTIC FINDINGS:   PAIN:  Are you having pain? No Did have abdominal pain with severe constipation (10/10) but this has resolved.    PRECAUTIONS: None  RED FLAGS: None   WEIGHT BEARING RESTRICTIONS: No  FALLS:  Has patient fallen in last 6 months? No  OCCUPATION: retired  ACTIVITY LEVEL : low   PLOF: Independent  PATIENT GOALS: to keep bowel regularity    BOWEL MOVEMENT: Pain with bowel movement: No Type of bowel movement:Type (Bristol Stool Scale) 4-5, Frequency daily, and Strain n Fully empty rectum: Yes:   Leakage: No                                                  Pads: No Fiber supplement/laxative milk of magnesia   URINATION: Pain with urination: No Fully empty bladder: Yes:                                   Post-void dribble: No Stream: Strong Urgency: No Frequency:during the day every few hours                                                          Nocturia: No   Leakage: none Pads/briefs: No  INTERCOURSE:  Ability to have vaginal penetration Yes  Pain with intercourse: nothing Dryness: No Climax: no Marinoff Scale: 0/3  PREGNANCY: Vaginal deliveries 0  C-section deliveries 0 Currently pregnant No  PROLAPSE: None   OBJECTIVE:  Note: Objective measures were completed at Evaluation unless otherwise noted.  DIAGNOSTIC FINDINGS:    PFIQ-7: 0  COGNITION: Overall cognitive status: Within functional limits for tasks assessed     SENSATION: Light touch: Appears intact  LUMBAR SPECIAL TESTS:  SI Compression/distraction test: Negative  FUNCTIONAL TESTS:   Single leg stance: mild hip instability but no LOB    Sit-up test: Squat: Bed mobility:  GAIT: WFL  POSTURE: posterior pelvic tilt   LUMBARAROM/PROM:  A/PROM A/PROM  Eval (% available)  Flexion 100  Extension 100  Right lateral flexion 100  Left lateral flexion 100  Right rotation 75  Left rotation 75   (Blank rows = not tested)  LOWER EXTREMITY ROM:  Bil hamstrings limited by 25%  LOWER EXTREMITY MMT:  Bil hips grossly 4/5 PALPATION:  General: mild TTP at bil piriformis and tightness noted in bil lumbar spine   Pelvic Alignment: WFL  Abdominal: tightness noted in upper lt quadrant and mid quadrant but no pain                  External Perineal Exam: pt deferred  Internal Pelvic Floor: pt deferred   Patient confirms identification and approves PT to assess internal pelvic floor and treatment No  PELVIC MMT:   MMT eval  Vaginal   Internal Anal Sphincter   External Anal Sphincter   Puborectalis    Diastasis Recti   (Blank rows = not tested)        TONE: Deferred   PROLAPSE: Deferred   TODAY'S TREATMENT:                                                                                                                              DATE:   02/01/24 EVAL Examination completed, findings reviewed, pt educated on POC, HEP, and voiding mechanics and abdominal massage. Pt motivated to participate in PT and agreeable to attempt recommendations.     PATIENT EDUCATION:  Education details: voiding mechanics and abdominal massage Person educated: Patient Education method: Explanation, Demonstration, Tactile cues, Verbal cues, and Handouts Education comprehension: verbalized understanding and returned demonstration  HOME EXERCISE PROGRAM: [id number]  ASSESSMENT:  CLINICAL IMPRESSION: Patient is a 63 y.o. female  who was seen today for physical therapy evaluation and treatment for constipation. Pt reports she has no longer had any symptoms of constipation and denies additional concerns for PT. Pt did want to do evaluation today for additional information to work on at home if bowels begin to slow again. Pt understands to reach out to MD if symptoms return and if needed may be referred back. Pt did demonstrate decreased tissue mobility at abdomen and reports she did have surgery in upper quadrant of abdomen but doesn't have pain usually just some tightness; and mild hip and core weakness. Pt reports since retirement has not been active, encouraged to work back into activity as able and within tolerance and pt agreed to attempt. Pt educated on voiding mechanics and abdominal massage and general information on fiber as needed. Encouraged to ask MD about any additional needs with medical intervention.  Pt denied need for additional PT, no goals as this will serve as DC from PT.   OBJECTIVE IMPAIRMENTS: decreased strength, increased fascial restrictions, and impaired flexibility.   ACTIVITY LIMITATIONS:  continence  PARTICIPATION LIMITATIONS: none  PERSONAL FACTORS: Time since onset of injury/illness/exacerbation are also affecting patient's functional outcome.   REHAB POTENTIAL: Good  CLINICAL DECISION MAKING: Stable/uncomplicated  EVALUATION COMPLEXITY: Low  PLAN:  PT FREQUENCY: one time visit  PT DURATION: 1 sessions  PLANNED INTERVENTIONS: 97110-Therapeutic exercises, 97530- Therapeutic activity, 97112- Neuromuscular re-education, 97535- Self Care, [id number]- Manual therapy, and Patient/Family education  PLAN FOR NEXT SESSION: discharge   PHYSICAL THERAPY DISCHARGE SUMMARY  Visits from Start of Care: 1  Current functional level related to goals / functional outcomes: Pt denied concerns at eval   Remaining deficits: None per pt   Education / Equipment: HEP   Patient agrees to discharge. Patient goals were met. Patient is being discharged due to pt has had symptoms resolve since referral placed.  Darryle Navy, PT, DPT 10/08/252:41 PM  [facility] [address], [address] [place], [place] [address] Phone # [phone] Fax [phone]

## 2024-03-06 DIAGNOSIS — H25812 Combined forms of age-related cataract, left eye: Secondary | ICD-10-CM | POA: Diagnosis not present

## 2024-03-06 DIAGNOSIS — H25813 Combined forms of age-related cataract, bilateral: Secondary | ICD-10-CM | POA: Diagnosis not present

## 2024-03-06 DIAGNOSIS — Z01818 Encounter for other preprocedural examination: Secondary | ICD-10-CM | POA: Diagnosis not present

## 2024-03-30 DIAGNOSIS — R5383 Other fatigue: Secondary | ICD-10-CM | POA: Diagnosis not present

## 2024-03-30 DIAGNOSIS — E559 Vitamin D deficiency, unspecified: Secondary | ICD-10-CM | POA: Diagnosis not present

## 2024-03-30 DIAGNOSIS — E7800 Pure hypercholesterolemia, unspecified: Secondary | ICD-10-CM | POA: Diagnosis not present

## 2024-03-30 DIAGNOSIS — Z79899 Other long term (current) drug therapy: Secondary | ICD-10-CM | POA: Diagnosis not present

## 2024-03-30 DIAGNOSIS — F419 Anxiety disorder, unspecified: Secondary | ICD-10-CM | POA: Diagnosis not present

## 2024-04-30 ENCOUNTER — Encounter: Payer: Self-pay | Admitting: Oncology

## 2024-05-17 ENCOUNTER — Telehealth: Payer: Self-pay

## 2024-05-17 NOTE — Telephone Encounter (Addendum)
 Spoke with patient about her appointments on Monday 05/21/24 and advised her of new appointment dates and time for Thursday 05/24/24 starting at 0845. She was agreeable and verbalized understanding.

## 2024-05-21 ENCOUNTER — Inpatient Hospital Stay: Admitting: Oncology

## 2024-05-21 ENCOUNTER — Inpatient Hospital Stay

## 2024-05-24 ENCOUNTER — Inpatient Hospital Stay

## 2024-05-24 ENCOUNTER — Inpatient Hospital Stay: Admitting: Oncology

## 2024-07-12 ENCOUNTER — Inpatient Hospital Stay

## 2024-07-12 ENCOUNTER — Inpatient Hospital Stay: Admitting: Oncology
